# Patient Record
Sex: Female | Born: 1975 | Race: Black or African American | Hispanic: No | Marital: Single | Smoking: Current every day smoker
Health system: Southern US, Community
[De-identification: ages and names within clinical notes are randomized; demographics above are authoritative.]

## PROBLEM LIST (undated history)

## (undated) DIAGNOSIS — G4730 Sleep apnea, unspecified: Secondary | ICD-10-CM

## (undated) DIAGNOSIS — N390 Urinary tract infection, site not specified: Secondary | ICD-10-CM

## (undated) DIAGNOSIS — Z765 Malingerer [conscious simulation]: Secondary | ICD-10-CM

## (undated) DIAGNOSIS — I10 Essential (primary) hypertension: Secondary | ICD-10-CM

## (undated) DIAGNOSIS — M549 Dorsalgia, unspecified: Secondary | ICD-10-CM

## (undated) DIAGNOSIS — I509 Heart failure, unspecified: Secondary | ICD-10-CM

## (undated) DIAGNOSIS — F1910 Other psychoactive substance abuse, uncomplicated: Secondary | ICD-10-CM

## (undated) DIAGNOSIS — G43909 Migraine, unspecified, not intractable, without status migrainosus: Secondary | ICD-10-CM

## (undated) DIAGNOSIS — M1990 Unspecified osteoarthritis, unspecified site: Secondary | ICD-10-CM

## (undated) DIAGNOSIS — G8929 Other chronic pain: Secondary | ICD-10-CM

## (undated) HISTORY — PX: CHOLECYSTECTOMY: SHX55

## (undated) HISTORY — PX: CARDIAC CATHETERIZATION: SHX172

---

## 2002-02-01 ENCOUNTER — Emergency Department (HOSPITAL_COMMUNITY): Admission: EM | Admit: 2002-02-01 | Discharge: 2002-02-01 | Payer: Self-pay | Admitting: *Deleted

## 2002-03-21 ENCOUNTER — Emergency Department (HOSPITAL_COMMUNITY): Admission: EM | Admit: 2002-03-21 | Discharge: 2002-03-21 | Payer: Self-pay | Admitting: Emergency Medicine

## 2002-04-18 ENCOUNTER — Emergency Department (HOSPITAL_COMMUNITY): Admission: EM | Admit: 2002-04-18 | Discharge: 2002-04-18 | Payer: Self-pay | Admitting: *Deleted

## 2002-04-18 ENCOUNTER — Encounter: Payer: Self-pay | Admitting: *Deleted

## 2002-05-25 ENCOUNTER — Emergency Department (HOSPITAL_COMMUNITY): Admission: EM | Admit: 2002-05-25 | Discharge: 2002-05-25 | Payer: Self-pay | Admitting: Emergency Medicine

## 2002-07-05 ENCOUNTER — Emergency Department (HOSPITAL_COMMUNITY): Admission: EM | Admit: 2002-07-05 | Discharge: 2002-07-05 | Payer: Self-pay

## 2002-08-16 ENCOUNTER — Emergency Department (HOSPITAL_COMMUNITY): Admission: EM | Admit: 2002-08-16 | Discharge: 2002-08-17 | Payer: Self-pay | Admitting: Emergency Medicine

## 2002-08-16 ENCOUNTER — Encounter: Payer: Self-pay | Admitting: Emergency Medicine

## 2006-06-26 ENCOUNTER — Emergency Department (HOSPITAL_COMMUNITY): Admission: EM | Admit: 2006-06-26 | Discharge: 2006-06-26 | Payer: Self-pay | Admitting: Emergency Medicine

## 2006-07-15 ENCOUNTER — Emergency Department (HOSPITAL_COMMUNITY): Admission: EM | Admit: 2006-07-15 | Discharge: 2006-07-15 | Payer: Self-pay | Admitting: Emergency Medicine

## 2006-07-31 ENCOUNTER — Emergency Department (HOSPITAL_COMMUNITY): Admission: EM | Admit: 2006-07-31 | Discharge: 2006-07-31 | Payer: Self-pay | Admitting: Emergency Medicine

## 2006-09-10 ENCOUNTER — Emergency Department (HOSPITAL_COMMUNITY): Admission: EM | Admit: 2006-09-10 | Discharge: 2006-09-10 | Payer: Self-pay | Admitting: Emergency Medicine

## 2007-04-04 ENCOUNTER — Emergency Department (HOSPITAL_COMMUNITY): Admission: EM | Admit: 2007-04-04 | Discharge: 2007-04-04 | Payer: Self-pay | Admitting: Emergency Medicine

## 2007-06-07 ENCOUNTER — Emergency Department (HOSPITAL_COMMUNITY): Admission: EM | Admit: 2007-06-07 | Discharge: 2007-06-07 | Payer: Self-pay | Admitting: Emergency Medicine

## 2010-04-18 ENCOUNTER — Emergency Department (HOSPITAL_COMMUNITY)
Admission: EM | Admit: 2010-04-18 | Discharge: 2010-04-18 | Payer: Self-pay | Source: Home / Self Care | Admitting: Emergency Medicine

## 2010-04-24 ENCOUNTER — Emergency Department (HOSPITAL_COMMUNITY)
Admission: EM | Admit: 2010-04-24 | Discharge: 2010-04-24 | Payer: Self-pay | Source: Home / Self Care | Admitting: Emergency Medicine

## 2010-04-28 ENCOUNTER — Emergency Department (HOSPITAL_COMMUNITY)
Admission: EM | Admit: 2010-04-28 | Discharge: 2010-04-28 | Payer: Self-pay | Source: Home / Self Care | Admitting: Emergency Medicine

## 2010-05-04 ENCOUNTER — Emergency Department (HOSPITAL_COMMUNITY)
Admission: EM | Admit: 2010-05-04 | Discharge: 2010-05-04 | Payer: Self-pay | Source: Home / Self Care | Admitting: Emergency Medicine

## 2010-05-05 ENCOUNTER — Emergency Department (HOSPITAL_COMMUNITY)
Admission: EM | Admit: 2010-05-05 | Discharge: 2010-05-05 | Payer: Self-pay | Source: Home / Self Care | Admitting: Emergency Medicine

## 2010-05-08 ENCOUNTER — Emergency Department (HOSPITAL_COMMUNITY)
Admission: EM | Admit: 2010-05-08 | Discharge: 2010-05-08 | Payer: Self-pay | Source: Home / Self Care | Admitting: Emergency Medicine

## 2010-05-17 ENCOUNTER — Emergency Department (HOSPITAL_COMMUNITY)
Admission: EM | Admit: 2010-05-17 | Discharge: 2010-05-17 | Payer: Self-pay | Source: Home / Self Care | Admitting: Emergency Medicine

## 2010-05-18 ENCOUNTER — Emergency Department (HOSPITAL_COMMUNITY)
Admission: EM | Admit: 2010-05-18 | Discharge: 2010-05-19 | Payer: Self-pay | Source: Home / Self Care | Admitting: Emergency Medicine

## 2010-05-18 LAB — GLUCOSE, CAPILLARY: Glucose-Capillary: 331 mg/dL — ABNORMAL HIGH (ref 70–99)

## 2010-05-22 ENCOUNTER — Emergency Department (HOSPITAL_COMMUNITY)
Admission: EM | Admit: 2010-05-22 | Discharge: 2010-05-22 | Payer: Self-pay | Source: Home / Self Care | Admitting: Emergency Medicine

## 2010-05-28 ENCOUNTER — Inpatient Hospital Stay (HOSPITAL_COMMUNITY)
Admission: EM | Admit: 2010-05-28 | Discharge: 2010-05-31 | Payer: Self-pay | Source: Home / Self Care | Attending: Internal Medicine | Admitting: Internal Medicine

## 2010-05-29 ENCOUNTER — Encounter (INDEPENDENT_AMBULATORY_CARE_PROVIDER_SITE_OTHER): Payer: Self-pay | Admitting: Internal Medicine

## 2010-05-30 LAB — BASIC METABOLIC PANEL
BUN: 8 mg/dL (ref 6–23)
BUN: 12 mg/dL (ref 6–23)
CO2: 27 mEq/L (ref 19–32)
CO2: 27 mEq/L (ref 19–32)
Calcium: 9.3 mg/dL (ref 8.4–10.5)
Calcium: 9.3 mg/dL (ref 8.4–10.5)
Chloride: 97 mEq/L (ref 96–112)
Chloride: 95 mEq/L — ABNORMAL LOW (ref 96–112)
Creatinine, Ser: 1.19 mg/dL (ref 0.4–1.2)
Creatinine, Ser: 1.5 mg/dL — ABNORMAL HIGH (ref 0.4–1.2)
GFR calc Af Amer: >60 mL/min (ref >60)
GFR calc Af Amer: 48 mL/min — ABNORMAL LOW (ref >60)
GFR calc non Af Amer: 52 mL/min — ABNORMAL LOW (ref >60)
GFR calc non Af Amer: 40 mL/min — ABNORMAL LOW (ref >60)
Glucose, Bld: 391 mg/dL — ABNORMAL HIGH (ref 70–99)
Glucose, Bld: 366 mg/dL — ABNORMAL HIGH (ref 70–99)
Potassium: 3.5 mEq/L (ref 3.5–5.1)
Potassium: 4 mEq/L (ref 3.5–5.1)
Sodium: 134 mEq/L — ABNORMAL LOW (ref 135–145)
Sodium: 134 mEq/L — ABNORMAL LOW (ref 135–145)

## 2010-05-30 LAB — GLUCOSE, RANDOM: Glucose, Bld: 454 mg/dL — ABNORMAL HIGH (ref 70–99)

## 2010-05-30 LAB — URINALYSIS, ROUTINE W REFLEX MICROSCOPIC
Bilirubin Urine: NEGATIVE
Hgb urine dipstick: NEGATIVE
Ketones, ur: NEGATIVE mg/dL
Leukocytes, UA: NEGATIVE
Nitrite: NEGATIVE
Protein, ur: NEGATIVE mg/dL
Specific Gravity, Urine: 1.013 (ref 1.005–1.030)
Urine Glucose, Fasting: >1000 mg/dL — AB
Urobilinogen, UA: 0.2 mg/dL (ref 0.0–1.0)
pH: 6 (ref 5.0–8.0)

## 2010-05-30 LAB — LIPID PANEL
Cholesterol: 164 mg/dL (ref 0–200)
HDL: 56 mg/dL (ref >39)
LDL Cholesterol: 42 mg/dL (ref 0–99)
Total CHOL/HDL Ratio: 2.9 RATIO
Triglycerides: 332 mg/dL — ABNORMAL HIGH (ref <150)
VLDL: 66 mg/dL — ABNORMAL HIGH (ref 0–40)

## 2010-05-30 LAB — CARDIAC PANEL(CRET KIN+CKTOT+MB+TROPI)
CK, MB: 1 ng/mL (ref 0.3–4.0)
CK, MB: 1.2 ng/mL (ref 0.3–4.0)
CK, MB: 1.4 ng/mL (ref 0.3–4.0)
Relative Index: INVALID (ref 0.0–2.5)
Relative Index: INVALID (ref 0.0–2.5)
Relative Index: INVALID (ref 0.0–2.5)
Total CK: 73 U/L (ref 7–177)
Total CK: 74 U/L (ref 7–177)
Total CK: 84 U/L (ref 7–177)
Troponin I: 0.04 ng/mL (ref 0.00–0.06)
Troponin I: 0.04 ng/mL (ref 0.00–0.06)
Troponin I: 0.03 ng/mL (ref 0.00–0.06)

## 2010-05-30 LAB — GLUCOSE, CAPILLARY
Glucose-Capillary: 304 mg/dL — ABNORMAL HIGH (ref 70–99)
Glucose-Capillary: 319 mg/dL — ABNORMAL HIGH (ref 70–99)
Glucose-Capillary: 396 mg/dL — ABNORMAL HIGH (ref 70–99)
Glucose-Capillary: 280 mg/dL — ABNORMAL HIGH (ref 70–99)
Glucose-Capillary: 346 mg/dL — ABNORMAL HIGH (ref 70–99)
Glucose-Capillary: 408 mg/dL — ABNORMAL HIGH (ref 70–99)
Glucose-Capillary: 419 mg/dL — ABNORMAL HIGH (ref 70–99)
Glucose-Capillary: 424 mg/dL — ABNORMAL HIGH (ref 70–99)
Glucose-Capillary: 431 mg/dL — ABNORMAL HIGH (ref 70–99)
Glucose-Capillary: 490 mg/dL — ABNORMAL HIGH (ref 70–99)

## 2010-05-30 LAB — CBC
HCT: 39.1 % (ref 36.0–46.0)
HCT: 37 % (ref 36.0–46.0)
Hemoglobin: 12.9 g/dL (ref 12.0–15.0)
Hemoglobin: 12 g/dL (ref 12.0–15.0)
MCH: 30.9 pg (ref 26.0–34.0)
MCH: 30.5 pg (ref 26.0–34.0)
MCHC: 33 g/dL (ref 30.0–36.0)
MCHC: 32.4 g/dL (ref 30.0–36.0)
MCV: 93.5 fL (ref 78.0–100.0)
MCV: 93.9 fL (ref 78.0–100.0)
Platelets: 255 10*3/uL (ref 150–400)
Platelets: 251 10*3/uL (ref 150–400)
RBC: 4.18 MIL/uL (ref 3.87–5.11)
RBC: 3.94 MIL/uL (ref 3.87–5.11)
RDW: 14.9 % (ref 11.5–15.5)
RDW: 15.2 % (ref 11.5–15.5)
WBC: 6.3 10*3/uL (ref 4.0–10.5)
WBC: 9.2 10*3/uL (ref 4.0–10.5)

## 2010-05-30 LAB — DIFFERENTIAL
Basophils Absolute: 0 10*3/uL (ref 0.0–0.1)
Basophils Relative: 0 % (ref 0–1)
Eosinophils Absolute: 0.1 10*3/uL (ref 0.0–0.7)
Eosinophils Relative: 2 % (ref 0–5)
Lymphocytes Relative: 26 % (ref 12–46)
Lymphs Abs: 1.6 10*3/uL (ref 0.7–4.0)
Monocytes Absolute: 0.4 10*3/uL (ref 0.1–1.0)
Monocytes Relative: 7 % (ref 3–12)
Neutro Abs: 4.1 10*3/uL (ref 1.7–7.7)
Neutrophils Relative %: 66 % (ref 43–77)

## 2010-05-30 LAB — URINE MICROSCOPIC-ADD ON

## 2010-05-30 LAB — BLOOD GAS, ARTERIAL
Acid-Base Excess: 1.8 mmol/L (ref 0.0–2.0)
Bicarbonate: 25.9 mEq/L — ABNORMAL HIGH (ref 20.0–24.0)
Drawn by: 30996
FIO2: 0.21 %
O2 Saturation: 95.4 %
Patient temperature: 98.6
TCO2: 23.4 mmol/L (ref 0–100)
pCO2 arterial: 41 mmHg (ref 35.0–45.0)
pH, Arterial: 7.418 — ABNORMAL HIGH (ref 7.350–7.400)
pO2, Arterial: 73.7 mmHg — ABNORMAL LOW (ref 80.0–100.0)

## 2010-05-30 LAB — POCT PREGNANCY, URINE: Preg Test, Ur: NEGATIVE

## 2010-05-30 LAB — MAGNESIUM
Magnesium: 1.8 mg/dL (ref 1.5–2.5)
Magnesium: 1.8 mg/dL (ref 1.5–2.5)

## 2010-05-30 LAB — HEMOGLOBIN A1C
Hgb A1c MFr Bld: 11.7 % — ABNORMAL HIGH (ref <5.7)
Mean Plasma Glucose: 289 mg/dL — ABNORMAL HIGH (ref <117)

## 2010-05-30 LAB — POCT CARDIAC MARKERS
CKMB, poc: <1 ng/mL — ABNORMAL LOW (ref 1.0–8.0)
Myoglobin, poc: 139 ng/mL (ref 12–200)
Troponin i, poc: <0.05 ng/mL (ref 0.00–0.09)

## 2010-05-30 LAB — BRAIN NATRIURETIC PEPTIDE: Pro B Natriuretic peptide (BNP): 216 pg/mL — ABNORMAL HIGH (ref 0.0–100.0)

## 2010-06-01 LAB — GLUCOSE, CAPILLARY
Glucose-Capillary: 360 mg/dL — ABNORMAL HIGH (ref 70–99)
Glucose-Capillary: 254 mg/dL — ABNORMAL HIGH (ref 70–99)
Glucose-Capillary: 276 mg/dL — ABNORMAL HIGH (ref 70–99)
Glucose-Capillary: 457 mg/dL — ABNORMAL HIGH (ref 70–99)
Glucose-Capillary: 311 mg/dL — ABNORMAL HIGH (ref 70–99)
Glucose-Capillary: 219 mg/dL — ABNORMAL HIGH (ref 70–99)
Glucose-Capillary: 176 mg/dL — ABNORMAL HIGH (ref 70–99)
Glucose-Capillary: 321 mg/dL — ABNORMAL HIGH (ref 70–99)

## 2010-06-01 LAB — BASIC METABOLIC PANEL
BUN: 17 mg/dL (ref 6–23)
CO2: 28 mEq/L (ref 19–32)
Calcium: 9.4 mg/dL (ref 8.4–10.5)
Chloride: 96 mEq/L (ref 96–112)
Creatinine, Ser: 1.59 mg/dL — ABNORMAL HIGH (ref 0.4–1.2)
GFR calc Af Amer: 45 mL/min — ABNORMAL LOW (ref >60)
GFR calc non Af Amer: 37 mL/min — ABNORMAL LOW (ref >60)
Glucose, Bld: 308 mg/dL — ABNORMAL HIGH (ref 70–99)
Potassium: 3.6 mEq/L (ref 3.5–5.1)
Sodium: 135 mEq/L (ref 135–145)

## 2010-06-07 NOTE — Discharge Summary (Signed)
NAME:  Debra Stokes, Debra Stokes NO.:  [id number]  MEDICAL RECORD NO.:  [id number]          PATIENT TYPE:  INP  LOCATION:  1423                         FACILITY:  [facility]  PHYSICIAN:  Brendia Sacks, MD    DATE OF BIRTH:  [birth year]  DATE OF ADMISSION:  05/28/2010 DATE OF DISCHARGE:  05/31/2010                              DISCHARGE SUMMARY   PRIMARY CARE PHYSICIAN:  None, but she will be establishing with a physician of her choice in the [place] area.  CONDITION ON DISCHARGE:  Improved.  DISCHARGE DIAGNOSES: 1. Acute diastolic congestive heart failure secondary to hypertensive     heart disease. 2. Uncontrolled hypertension, now improved. 3. Uncontrolled diabetes mellitus type 2, improved. 4. Subacute bronchitis. 5. Chronic kidney disease stage 3.  HISTORY OF PRESENT ILLNESS:  This is a 35 year old woman who presented to the emergency room with shortness of breath.  HOSPITAL COURSE: 1. Acute diastolic congestive heart failure.  The patient was admitted     to the medical floor, underwent 2-D echocardiogram which revealed     diastolic dysfunction.  She was diuresed with Lasix and feels much     better at this point.  She was started on low-dose beta blocker and     will continue on Lasix at this time.  She probably also has a     component of obesity hypoventilation syndrome, and she does have a     known history of asthma although this appears to be stable at this     point.  She will establish with a primary care physician in the     area and would suggest considering followup with Cardiology in the     next several months. 2. Diabetes mellitus type 2, uncontrolled, now improved, controlled     during this hospitalization.  This has been uncontrolled for some     time as demonstrated by her hemoglobin A1c.  She will continue on     her glipizide.  Her Lantus has been increased.  She will complete     her short prednisone taper over the next couple of days  and expect     her blood sugars will continue to improve on this higher regimen of     Lantus. 3. Hypertension, fair control at this point and stable for discharge.     Continue on Lasix, beta-blocker therapy and low-dose lisinopril     given her diabetes.  I have decreased her clonidine to 0.2 mg and     would suggest weaning this off if possible. 4. Subacute bronchitis.  She will complete Augmentin here in the     hospital and complete a rapid prednisone taper.  She will continue     with albuterol nebulizers for her asthma as needed. 5. Chronic kidney disease stage 3, resolved, paucity of data but her     baseline creatinine does appear to be approximately 1.3.  Her     creatinine here during this hospitalization has been stable.     Suggest followup in the outpatient setting as clinically indicated.     This is probably a combination  of hypertensive heart disease as     well as diabetes-induced kidney damage.  CONSULTATIONS:  None.  PROCEDURES:  None.  IMAGING: 1. Chest x-ray on January 14:  No acute cardiopulmonary disease. 2. CT angiogram of the chest, January 14:  Limited exam secondary to     body habitus.  No evidence of pulmonary embolism.  Cardiomegaly     without edema.  Findings suggestive of small airways disease/air     trapping.  Hepatic steatosis is seen.  MICROBIOLOGY:  None.  ANCILLARY STUDIES:  2-D echocardiogram showed a left ventricular ejection fraction characterized as mildly reduced.  Doppler parameters were consistent with the restrictive physiology.  Findings suggest decompensated diastolic congestive heart failure.  Systolic function probably mildly reduced.  Consider repeating echo in the future with Definity contrast.  PERTINENT LABORATORY STUDIES: 1. BNP was mildly elevated at 216. 2. Urine pregnancy was negative.  Triglycerides were 332, hemoglobin     A1c was 11.7.  Basic metabolic panel notable for creatinine of 1.5-     1.59 which appears to  be stable and near the patient's baseline of     approximately 1.3. 3. CBC was unremarkable.  PHYSICAL EXAMINATION ON DISCHARGE:  GENERAL:  The patient is feeling well.  No complaints.  She would like to go home. VITAL SIGNS:  Temperature is 98.0, pulse 75, respirations 18, blood pressure 151/95, sat 95% on room air.  CARDIOVASCULAR:  Regular rate and rhythm.  No murmur, rub or gallop. RESPIRATORY:  Clear to auscultation bilaterally.  No wheezes, rales or rhonchi.  Normal respiratory effort.  No lower extremity edema.  DISCHARGE INSTRUCTIONS:  The patient will be discharged home today. Heart-healthy diabetic diet.  Activity as tolerated.  She has Medicare and Medicaid insurance and has been provided with resources for finding a primary care physician of her choice in this area.  She should follow up in 2-3 weeks' time.  DISCHARGE MEDICATIONS: 1. Albuterol inhaler 2 puffs every 4 hours as needed for shortness of     breath. 2. Clonidine 0.3 mg p.o. b.i.d., note this dosage remains unchanged. 3. Lasix 20 mg p.o. daily. 4. Lantus has been increased to 35 units subcu b.i.d. 5. Metoprolol 25 mg p.o. b.i.d. 6. Prednisone 10 mg tablets take 20 mg tomorrow on January 18, then 10     mg p.o. daily x2 days and then stop. 7. Albuterol nebulizer every 4 hours as needed for shortness of     breath. 8. Colchicine 0.6 mg p.o. every morning. 9. Glipizide 10 mg p.o. daily.  Discontinue the following medications: 1. Hydrochlorothiazide. 2. Augmentin. 3. Excedrin Extra Strength.  THINGS TO FOLLOW UP IN THE OUTPATIENT SETTING: 1. Acute diastolic heart failure.  Continue to titrate medications as     needed.  Follow periodic basic metabolic panel.  Consider     outpatient referral to Cardiology in the next several months. 2. Uncontrolled diabetes mellitus type 2.  See above.  Hopefully can     start an ACE inhibitor on her in the near future given her chronic     kidney disease with mild  elevation above her baseline here and the     fact that she has been started on Lasix in place of     hydrochlorothiazide, at this point would hold off on ACE inhibitor     therapy.  However, again would suggest instituting this and     following her basic metabolic panel closely as an outpatient. 3. Hypertension.  As described above, continue to titrate medications     for goal blood pressure. 4. Hypertriglyceridemia.  Trial of diet.  Consider medication therapy     if needed. 5. Asthma.  Note, she had an abnormal CT scan which showed some air     trapping.  Consider referral in the outpatient setting for periodic     monitoring of her asthma. 6. Chronic kidney disease stage 3.  Continue to follow this as     described above.  TIME COORDINATING DISCHARGE:  35 minutes.     Brendia Sacks, MD     DG/MEDQ  D:  05/31/2010  T:  05/31/2010  Job:  [id number]  Electronically Signed by Brendia Sacks  on 06/07/2010 02:19:58 PM

## 2010-06-08 ENCOUNTER — Emergency Department (HOSPITAL_COMMUNITY)
Admission: EM | Admit: 2010-06-08 | Discharge: 2010-06-08 | Payer: Self-pay | Source: Home / Self Care | Admitting: Emergency Medicine

## 2010-07-03 ENCOUNTER — Emergency Department (HOSPITAL_COMMUNITY)
Admission: EM | Admit: 2010-07-03 | Discharge: 2010-07-03 | Disposition: A | Discharge status: Home / Self Care | Payer: Medicare Other | Attending: Emergency Medicine | Admitting: Emergency Medicine

## 2010-07-03 ENCOUNTER — Emergency Department (HOSPITAL_COMMUNITY): Payer: Medicare Other

## 2010-07-03 DIAGNOSIS — I10 Essential (primary) hypertension: Secondary | ICD-10-CM | POA: Insufficient documentation

## 2010-07-03 DIAGNOSIS — M546 Pain in thoracic spine: Secondary | ICD-10-CM | POA: Insufficient documentation

## 2010-07-03 DIAGNOSIS — J45909 Unspecified asthma, uncomplicated: Secondary | ICD-10-CM | POA: Insufficient documentation

## 2010-07-03 DIAGNOSIS — Z794 Long term (current) use of insulin: Secondary | ICD-10-CM | POA: Insufficient documentation

## 2010-07-03 DIAGNOSIS — R079 Chest pain, unspecified: Secondary | ICD-10-CM | POA: Insufficient documentation

## 2010-07-03 DIAGNOSIS — E119 Type 2 diabetes mellitus without complications: Secondary | ICD-10-CM | POA: Insufficient documentation

## 2010-07-03 DIAGNOSIS — Z79899 Other long term (current) drug therapy: Secondary | ICD-10-CM | POA: Insufficient documentation

## 2010-07-03 DIAGNOSIS — I5030 Unspecified diastolic (congestive) heart failure: Secondary | ICD-10-CM | POA: Insufficient documentation

## 2010-07-03 DIAGNOSIS — M1990 Unspecified osteoarthritis, unspecified site: Secondary | ICD-10-CM | POA: Insufficient documentation

## 2010-07-06 ENCOUNTER — Emergency Department (HOSPITAL_COMMUNITY): Payer: Medicare Other

## 2010-07-06 ENCOUNTER — Inpatient Hospital Stay (INDEPENDENT_AMBULATORY_CARE_PROVIDER_SITE_OTHER)
Admission: RE | Admit: 2010-07-06 | Discharge: 2010-07-06 | Disposition: A | Discharge status: Home / Self Care | Payer: Medicare Other | Source: Ambulatory Visit | Attending: Emergency Medicine | Admitting: Emergency Medicine

## 2010-07-06 ENCOUNTER — Emergency Department (HOSPITAL_COMMUNITY)
Admission: EM | Admit: 2010-07-06 | Discharge: 2010-07-06 | Disposition: A | Discharge status: Home / Self Care | Payer: Medicare Other | Attending: Emergency Medicine | Admitting: Emergency Medicine

## 2010-07-06 DIAGNOSIS — M549 Dorsalgia, unspecified: Secondary | ICD-10-CM | POA: Insufficient documentation

## 2010-07-06 DIAGNOSIS — Z794 Long term (current) use of insulin: Secondary | ICD-10-CM | POA: Insufficient documentation

## 2010-07-06 DIAGNOSIS — M542 Cervicalgia: Secondary | ICD-10-CM

## 2010-07-06 DIAGNOSIS — E119 Type 2 diabetes mellitus without complications: Secondary | ICD-10-CM | POA: Insufficient documentation

## 2010-07-06 DIAGNOSIS — I10 Essential (primary) hypertension: Secondary | ICD-10-CM

## 2010-07-06 DIAGNOSIS — J45909 Unspecified asthma, uncomplicated: Secondary | ICD-10-CM | POA: Insufficient documentation

## 2010-07-06 LAB — URINALYSIS, ROUTINE W REFLEX MICROSCOPIC
Leukocytes, UA: NEGATIVE
Protein, ur: 30 mg/dL — AB
Specific Gravity, Urine: 1.018 (ref 1.005–1.030)
Urine Glucose, Fasting: 250 mg/dL — AB

## 2010-07-06 LAB — URINE MICROSCOPIC-ADD ON

## 2010-07-17 ENCOUNTER — Emergency Department (HOSPITAL_COMMUNITY)
Admission: EM | Admit: 2010-07-17 | Discharge: 2010-07-17 | Disposition: A | Discharge status: Home / Self Care | Payer: Medicare Other | Attending: Emergency Medicine | Admitting: Emergency Medicine

## 2010-07-17 DIAGNOSIS — L02219 Cutaneous abscess of trunk, unspecified: Secondary | ICD-10-CM | POA: Insufficient documentation

## 2010-07-17 DIAGNOSIS — E119 Type 2 diabetes mellitus without complications: Secondary | ICD-10-CM | POA: Insufficient documentation

## 2010-07-17 DIAGNOSIS — Z794 Long term (current) use of insulin: Secondary | ICD-10-CM | POA: Insufficient documentation

## 2010-07-17 DIAGNOSIS — I10 Essential (primary) hypertension: Secondary | ICD-10-CM | POA: Insufficient documentation

## 2010-07-19 ENCOUNTER — Emergency Department (HOSPITAL_COMMUNITY)
Admission: EM | Admit: 2010-07-19 | Discharge: 2010-07-19 | Disposition: A | Discharge status: Home / Self Care | Payer: Medicare Other | Attending: Emergency Medicine | Admitting: Emergency Medicine

## 2010-07-19 DIAGNOSIS — M109 Gout, unspecified: Secondary | ICD-10-CM | POA: Insufficient documentation

## 2010-07-19 DIAGNOSIS — I509 Heart failure, unspecified: Secondary | ICD-10-CM | POA: Insufficient documentation

## 2010-07-19 DIAGNOSIS — Z794 Long term (current) use of insulin: Secondary | ICD-10-CM | POA: Insufficient documentation

## 2010-07-19 DIAGNOSIS — I10 Essential (primary) hypertension: Secondary | ICD-10-CM | POA: Insufficient documentation

## 2010-07-19 DIAGNOSIS — E119 Type 2 diabetes mellitus without complications: Secondary | ICD-10-CM | POA: Insufficient documentation

## 2010-07-19 DIAGNOSIS — L02219 Cutaneous abscess of trunk, unspecified: Secondary | ICD-10-CM | POA: Insufficient documentation

## 2010-07-19 DIAGNOSIS — J45909 Unspecified asthma, uncomplicated: Secondary | ICD-10-CM | POA: Insufficient documentation

## 2010-07-21 ENCOUNTER — Emergency Department (HOSPITAL_COMMUNITY): Payer: Medicare Other

## 2010-07-21 ENCOUNTER — Emergency Department (HOSPITAL_COMMUNITY)
Admission: EM | Admit: 2010-07-21 | Discharge: 2010-07-21 | Disposition: A | Discharge status: Home / Self Care | Payer: Medicare Other | Attending: Emergency Medicine | Admitting: Emergency Medicine

## 2010-07-21 DIAGNOSIS — L02219 Cutaneous abscess of trunk, unspecified: Secondary | ICD-10-CM | POA: Insufficient documentation

## 2010-07-21 DIAGNOSIS — R0989 Other specified symptoms and signs involving the circulatory and respiratory systems: Secondary | ICD-10-CM | POA: Insufficient documentation

## 2010-07-21 DIAGNOSIS — R059 Cough, unspecified: Secondary | ICD-10-CM | POA: Insufficient documentation

## 2010-07-21 DIAGNOSIS — R05 Cough: Secondary | ICD-10-CM | POA: Insufficient documentation

## 2010-07-21 DIAGNOSIS — I10 Essential (primary) hypertension: Secondary | ICD-10-CM | POA: Insufficient documentation

## 2010-07-21 DIAGNOSIS — R0789 Other chest pain: Secondary | ICD-10-CM | POA: Insufficient documentation

## 2010-07-21 DIAGNOSIS — R0609 Other forms of dyspnea: Secondary | ICD-10-CM | POA: Insufficient documentation

## 2010-07-21 DIAGNOSIS — E119 Type 2 diabetes mellitus without complications: Secondary | ICD-10-CM | POA: Insufficient documentation

## 2010-07-21 DIAGNOSIS — I428 Other cardiomyopathies: Secondary | ICD-10-CM | POA: Insufficient documentation

## 2010-07-21 DIAGNOSIS — J45909 Unspecified asthma, uncomplicated: Secondary | ICD-10-CM | POA: Insufficient documentation

## 2010-07-21 DIAGNOSIS — I509 Heart failure, unspecified: Secondary | ICD-10-CM | POA: Insufficient documentation

## 2010-07-21 LAB — CBC
HCT: 41.4 % (ref 36.0–46.0)
Hemoglobin: 13.4 g/dL (ref 12.0–15.0)
MCV: 91.4 fL (ref 78.0–100.0)
WBC: 6.3 10*3/uL (ref 4.0–10.5)

## 2010-07-21 LAB — GLUCOSE, CAPILLARY: Glucose-Capillary: 314 mg/dL — ABNORMAL HIGH (ref 70–99)

## 2010-07-21 LAB — COMPREHENSIVE METABOLIC PANEL
ALT: 22 U/L (ref 0–35)
Alkaline Phosphatase: 55 U/L (ref 39–117)
BUN: 13 mg/dL (ref 6–23)
CO2: 28 mEq/L (ref 19–32)
GFR calc non Af Amer: 53 mL/min — ABNORMAL LOW (ref >60)
Glucose, Bld: 326 mg/dL — ABNORMAL HIGH (ref 70–99)
Potassium: 3.5 mEq/L (ref 3.5–5.1)
Sodium: 135 mEq/L (ref 135–145)
Total Bilirubin: 0.7 mg/dL (ref 0.3–1.2)

## 2010-07-21 LAB — DIFFERENTIAL
Basophils Absolute: 0 10*3/uL (ref 0.0–0.1)
Eosinophils Relative: 1 % (ref 0–5)
Lymphocytes Relative: 22 % (ref 12–46)
Lymphs Abs: 1.4 10*3/uL (ref 0.7–4.0)
Monocytes Absolute: 0.5 10*3/uL (ref 0.1–1.0)
Neutro Abs: 4.3 10*3/uL (ref 1.7–7.7)

## 2010-07-21 LAB — POCT CARDIAC MARKERS
CKMB, poc: <1 ng/mL — ABNORMAL LOW (ref 1.0–8.0)
Troponin i, poc: <0.05 ng/mL (ref 0.00–0.09)

## 2010-07-21 LAB — BRAIN NATRIURETIC PEPTIDE: Pro B Natriuretic peptide (BNP): 84.9 pg/mL (ref 0.0–100.0)

## 2010-07-25 LAB — COMPREHENSIVE METABOLIC PANEL
ALT: 18 U/L (ref 0–35)
AST: 22 U/L (ref 0–37)
Albumin: 3.3 g/dL — ABNORMAL LOW (ref 3.5–5.2)
Alkaline Phosphatase: 57 U/L (ref 39–117)
BUN: 9 mg/dL (ref 6–23)
CO2: 28 mEq/L (ref 19–32)
Calcium: 9 mg/dL (ref 8.4–10.5)
Chloride: 99 mEq/L (ref 96–112)
Creatinine, Ser: 1.23 mg/dL — ABNORMAL HIGH (ref 0.4–1.2)
GFR calc Af Amer: >60 mL/min (ref >60)
GFR calc non Af Amer: 50 mL/min — ABNORMAL LOW (ref >60)
Glucose, Bld: 313 mg/dL — ABNORMAL HIGH (ref 70–99)
Potassium: 4 mEq/L (ref 3.5–5.1)
Sodium: 137 mEq/L (ref 135–145)
Total Bilirubin: 0.8 mg/dL (ref 0.3–1.2)
Total Protein: 7.4 g/dL (ref 6.0–8.3)

## 2010-07-25 LAB — POCT I-STAT, CHEM 8
BUN: 5 mg/dL — ABNORMAL LOW (ref 6–23)
Calcium, Ion: 1.08 mmol/L — ABNORMAL LOW (ref 1.12–1.32)
Calcium, Ion: 1.11 mmol/L — ABNORMAL LOW (ref 1.12–1.32)
Creatinine, Ser: 1.2 mg/dL (ref 0.4–1.2)
Glucose, Bld: 339 mg/dL — ABNORMAL HIGH (ref 70–99)
HCT: 45 % (ref 36.0–46.0)
Hemoglobin: 15.3 g/dL — ABNORMAL HIGH (ref 12.0–15.0)
Hemoglobin: 16 g/dL — ABNORMAL HIGH (ref 12.0–15.0)
Sodium: 137 mEq/L (ref 135–145)
Sodium: 137 mEq/L (ref 135–145)
TCO2: 30 mmol/L (ref 0–100)
TCO2: 30 mmol/L (ref 0–100)

## 2010-07-25 LAB — HEPATIC FUNCTION PANEL
Alkaline Phosphatase: 48 U/L (ref 39–117)
Indirect Bilirubin: 0.5 mg/dL (ref 0.3–0.9)
Total Bilirubin: 0.6 mg/dL (ref 0.3–1.2)
Total Protein: 7.5 g/dL (ref 6.0–8.3)

## 2010-07-25 LAB — BASIC METABOLIC PANEL
CO2: 27 mEq/L (ref 19–32)
Chloride: 98 mEq/L (ref 96–112)
Creatinine, Ser: 1.3 mg/dL — ABNORMAL HIGH (ref 0.4–1.2)
GFR calc Af Amer: 57 mL/min — ABNORMAL LOW (ref >60)
Potassium: 3.6 mEq/L (ref 3.5–5.1)

## 2010-07-25 LAB — POCT CARDIAC MARKERS
CKMB, poc: <1 ng/mL — ABNORMAL LOW (ref 1.0–8.0)
CKMB, poc: <1 ng/mL — ABNORMAL LOW (ref 1.0–8.0)
CKMB, poc: <1 ng/mL — ABNORMAL LOW (ref 1.0–8.0)
CKMB, poc: <1 ng/mL — ABNORMAL LOW (ref 1.0–8.0)
Myoglobin, poc: 40.8 ng/mL (ref 12–200)
Myoglobin, poc: 63.9 ng/mL (ref 12–200)
Myoglobin, poc: 79.9 ng/mL (ref 12–200)
Myoglobin, poc: 64.5 ng/mL (ref 12–200)
Myoglobin, poc: 65.6 ng/mL (ref 12–200)
Troponin i, poc: <0.05 ng/mL (ref 0.00–0.09)
Troponin i, poc: <0.05 ng/mL (ref 0.00–0.09)

## 2010-07-25 LAB — DIFFERENTIAL
Basophils Absolute: 0 10*3/uL (ref 0.0–0.1)
Basophils Relative: 0 % (ref 0–1)
Basophils Relative: 0 % (ref 0–1)
Eosinophils Absolute: 0.1 10*3/uL (ref 0.0–0.7)
Eosinophils Absolute: 0.1 10*3/uL (ref 0.0–0.7)
Eosinophils Relative: 1 % (ref 0–5)
Eosinophils Relative: 1 % (ref 0–5)
Lymphocytes Relative: 27 % (ref 12–46)
Lymphs Abs: 1.3 10*3/uL (ref 0.7–4.0)
Lymphs Abs: 2.2 10*3/uL (ref 0.7–4.0)
Monocytes Absolute: 0.4 10*3/uL (ref 0.1–1.0)
Monocytes Relative: 8 % (ref 3–12)
Monocytes Relative: 9 % (ref 3–12)
Neutro Abs: 3.1 10*3/uL (ref 1.7–7.7)
Neutrophils Relative %: 64 % (ref 43–77)

## 2010-07-25 LAB — CBC
HCT: 38.2 % (ref 36.0–46.0)
HCT: 42.4 % (ref 36.0–46.0)
Hemoglobin: 12.2 g/dL (ref 12.0–15.0)
Hemoglobin: 14.1 g/dL (ref 12.0–15.0)
MCH: 29.8 pg (ref 26.0–34.0)
MCH: 31.1 pg (ref 26.0–34.0)
MCH: 30.9 pg (ref 26.0–34.0)
MCHC: 31.9 g/dL (ref 30.0–36.0)
MCHC: 32.8 g/dL (ref 30.0–36.0)
MCV: 93.2 fL (ref 78.0–100.0)
MCV: 93.4 fL (ref 78.0–100.0)
MCV: 94.2 fL (ref 78.0–100.0)
Platelets: 246 10*3/uL (ref 150–400)
Platelets: 319 10*3/uL (ref 150–400)
Platelets: 259 10*3/uL (ref 150–400)
RBC: 4.1 MIL/uL (ref 3.87–5.11)
RBC: 4.54 MIL/uL (ref 3.87–5.11)
RBC: 4.47 MIL/uL (ref 3.87–5.11)
RDW: 14.6 % (ref 11.5–15.5)
WBC: 4.9 10*3/uL (ref 4.0–10.5)
WBC: 8.6 10*3/uL (ref 4.0–10.5)

## 2010-07-25 LAB — BRAIN NATRIURETIC PEPTIDE: Pro B Natriuretic peptide (BNP): 161 pg/mL — ABNORMAL HIGH (ref 0.0–100.0)

## 2010-07-25 LAB — D-DIMER, QUANTITATIVE: D-Dimer, Quant: 0.44 ug/mL-FEU (ref 0.00–0.48)

## 2010-07-25 LAB — GLUCOSE, CAPILLARY: Glucose-Capillary: 320 mg/dL — ABNORMAL HIGH (ref 70–99)

## 2010-07-25 LAB — LIPASE, BLOOD: Lipase: 35 U/L (ref 11–59)

## 2010-07-25 LAB — URINALYSIS, ROUTINE W REFLEX MICROSCOPIC
Bilirubin Urine: NEGATIVE
Glucose, UA: >1000 mg/dL — AB
Hgb urine dipstick: NEGATIVE
Specific Gravity, Urine: 1.008 (ref 1.005–1.030)
Urobilinogen, UA: 0.2 mg/dL (ref 0.0–1.0)

## 2010-07-25 LAB — URINE MICROSCOPIC-ADD ON

## 2010-07-29 ENCOUNTER — Emergency Department (HOSPITAL_COMMUNITY)
Admission: EM | Admit: 2010-07-29 | Discharge: 2010-07-29 | Disposition: A | Discharge status: Home / Self Care | Payer: Medicare Other | Attending: Emergency Medicine | Admitting: Emergency Medicine

## 2010-07-29 DIAGNOSIS — R079 Chest pain, unspecified: Secondary | ICD-10-CM | POA: Insufficient documentation

## 2010-07-29 DIAGNOSIS — E119 Type 2 diabetes mellitus without complications: Secondary | ICD-10-CM | POA: Insufficient documentation

## 2010-07-29 DIAGNOSIS — F411 Generalized anxiety disorder: Secondary | ICD-10-CM | POA: Insufficient documentation

## 2010-07-29 DIAGNOSIS — I10 Essential (primary) hypertension: Secondary | ICD-10-CM | POA: Insufficient documentation

## 2010-07-29 DIAGNOSIS — M549 Dorsalgia, unspecified: Secondary | ICD-10-CM | POA: Insufficient documentation

## 2010-07-29 DIAGNOSIS — Z794 Long term (current) use of insulin: Secondary | ICD-10-CM | POA: Insufficient documentation

## 2010-08-01 ENCOUNTER — Emergency Department (HOSPITAL_COMMUNITY)
Admission: EM | Admit: 2010-08-01 | Discharge: 2010-08-01 | Disposition: A | Discharge status: Home / Self Care | Payer: Medicare Other | Attending: Emergency Medicine | Admitting: Emergency Medicine

## 2010-08-01 DIAGNOSIS — I10 Essential (primary) hypertension: Secondary | ICD-10-CM | POA: Insufficient documentation

## 2010-08-01 DIAGNOSIS — E119 Type 2 diabetes mellitus without complications: Secondary | ICD-10-CM | POA: Insufficient documentation

## 2010-08-01 DIAGNOSIS — L02219 Cutaneous abscess of trunk, unspecified: Secondary | ICD-10-CM | POA: Insufficient documentation

## 2010-08-01 DIAGNOSIS — Z794 Long term (current) use of insulin: Secondary | ICD-10-CM | POA: Insufficient documentation

## 2010-08-01 DIAGNOSIS — L03319 Cellulitis of trunk, unspecified: Secondary | ICD-10-CM | POA: Insufficient documentation

## 2010-08-04 LAB — WOUND CULTURE: Culture: NORMAL

## 2010-08-05 ENCOUNTER — Emergency Department (HOSPITAL_COMMUNITY): Payer: Medicare Other

## 2010-08-05 ENCOUNTER — Emergency Department (HOSPITAL_COMMUNITY)
Admission: EM | Admit: 2010-08-05 | Discharge: 2010-08-06 | Disposition: A | Discharge status: Home / Self Care | Payer: Medicare Other | Attending: Emergency Medicine | Admitting: Emergency Medicine

## 2010-08-05 DIAGNOSIS — E119 Type 2 diabetes mellitus without complications: Secondary | ICD-10-CM | POA: Insufficient documentation

## 2010-08-05 DIAGNOSIS — R000 Tachycardia, unspecified: Secondary | ICD-10-CM | POA: Insufficient documentation

## 2010-08-05 DIAGNOSIS — R071 Chest pain on breathing: Secondary | ICD-10-CM | POA: Insufficient documentation

## 2010-08-05 DIAGNOSIS — I10 Essential (primary) hypertension: Secondary | ICD-10-CM | POA: Insufficient documentation

## 2010-08-05 DIAGNOSIS — F411 Generalized anxiety disorder: Secondary | ICD-10-CM | POA: Insufficient documentation

## 2010-08-05 DIAGNOSIS — Z794 Long term (current) use of insulin: Secondary | ICD-10-CM | POA: Insufficient documentation

## 2010-08-05 DIAGNOSIS — IMO0002 Reserved for concepts with insufficient information to code with codable children: Secondary | ICD-10-CM | POA: Insufficient documentation

## 2010-08-05 LAB — POCT CARDIAC MARKERS
CKMB, poc: 1.1 ng/mL (ref 1.0–8.0)
Myoglobin, poc: 57.3 ng/mL (ref 12–200)
Troponin i, poc: <0.05 ng/mL (ref 0.00–0.09)

## 2010-08-05 LAB — POCT I-STAT, CHEM 8
Calcium, Ion: 1.04 mmol/L — ABNORMAL LOW (ref 1.12–1.32)
Chloride: 102 mEq/L (ref 96–112)
HCT: 46 % (ref 36.0–46.0)
Potassium: 3.6 mEq/L (ref 3.5–5.1)
Sodium: 137 mEq/L (ref 135–145)

## 2010-08-07 ENCOUNTER — Emergency Department (HOSPITAL_COMMUNITY)
Admission: EM | Admit: 2010-08-07 | Discharge: 2010-08-07 | Disposition: A | Discharge status: Home / Self Care | Payer: Medicare Other | Attending: Emergency Medicine | Admitting: Emergency Medicine

## 2010-08-07 DIAGNOSIS — I10 Essential (primary) hypertension: Secondary | ICD-10-CM | POA: Insufficient documentation

## 2010-08-07 DIAGNOSIS — IMO0002 Reserved for concepts with insufficient information to code with codable children: Secondary | ICD-10-CM | POA: Insufficient documentation

## 2010-08-07 DIAGNOSIS — E119 Type 2 diabetes mellitus without complications: Secondary | ICD-10-CM | POA: Insufficient documentation

## 2010-08-07 DIAGNOSIS — Z794 Long term (current) use of insulin: Secondary | ICD-10-CM | POA: Insufficient documentation

## 2010-08-07 DIAGNOSIS — L02219 Cutaneous abscess of trunk, unspecified: Secondary | ICD-10-CM | POA: Insufficient documentation

## 2010-08-14 ENCOUNTER — Emergency Department (HOSPITAL_COMMUNITY)
Admission: EM | Admit: 2010-08-14 | Discharge: 2010-08-15 | Disposition: A | Discharge status: Home / Self Care | Payer: Medicare Other | Attending: Emergency Medicine | Admitting: Emergency Medicine

## 2010-08-14 ENCOUNTER — Emergency Department (HOSPITAL_COMMUNITY): Payer: Medicare Other

## 2010-08-14 DIAGNOSIS — R05 Cough: Secondary | ICD-10-CM | POA: Insufficient documentation

## 2010-08-14 DIAGNOSIS — E119 Type 2 diabetes mellitus without complications: Secondary | ICD-10-CM | POA: Insufficient documentation

## 2010-08-14 DIAGNOSIS — Z794 Long term (current) use of insulin: Secondary | ICD-10-CM | POA: Insufficient documentation

## 2010-08-14 DIAGNOSIS — R062 Wheezing: Secondary | ICD-10-CM | POA: Insufficient documentation

## 2010-08-14 DIAGNOSIS — I10 Essential (primary) hypertension: Secondary | ICD-10-CM | POA: Insufficient documentation

## 2010-08-14 DIAGNOSIS — J40 Bronchitis, not specified as acute or chronic: Secondary | ICD-10-CM | POA: Insufficient documentation

## 2010-08-14 DIAGNOSIS — R059 Cough, unspecified: Secondary | ICD-10-CM | POA: Insufficient documentation

## 2010-09-09 ENCOUNTER — Emergency Department (HOSPITAL_COMMUNITY)
Admission: EM | Admit: 2010-09-09 | Discharge: 2010-09-10 | Disposition: A | Discharge status: Home / Self Care | Payer: Medicare Other | Attending: Emergency Medicine | Admitting: Emergency Medicine

## 2010-09-09 ENCOUNTER — Emergency Department (HOSPITAL_COMMUNITY): Payer: Medicare Other

## 2010-09-09 DIAGNOSIS — Z794 Long term (current) use of insulin: Secondary | ICD-10-CM | POA: Insufficient documentation

## 2010-09-09 DIAGNOSIS — M79609 Pain in unspecified limb: Secondary | ICD-10-CM | POA: Insufficient documentation

## 2010-09-09 DIAGNOSIS — E119 Type 2 diabetes mellitus without complications: Secondary | ICD-10-CM | POA: Insufficient documentation

## 2010-09-09 DIAGNOSIS — I10 Essential (primary) hypertension: Secondary | ICD-10-CM | POA: Insufficient documentation

## 2010-09-09 DIAGNOSIS — I509 Heart failure, unspecified: Secondary | ICD-10-CM | POA: Insufficient documentation

## 2010-09-09 DIAGNOSIS — J45909 Unspecified asthma, uncomplicated: Secondary | ICD-10-CM | POA: Insufficient documentation

## 2010-09-09 DIAGNOSIS — R209 Unspecified disturbances of skin sensation: Secondary | ICD-10-CM | POA: Insufficient documentation

## 2010-09-09 DIAGNOSIS — Z862 Personal history of diseases of the blood and blood-forming organs and certain disorders involving the immune mechanism: Secondary | ICD-10-CM | POA: Insufficient documentation

## 2010-09-09 DIAGNOSIS — M5430 Sciatica, unspecified side: Secondary | ICD-10-CM | POA: Insufficient documentation

## 2010-09-09 DIAGNOSIS — G8929 Other chronic pain: Secondary | ICD-10-CM | POA: Insufficient documentation

## 2010-09-09 DIAGNOSIS — M129 Arthropathy, unspecified: Secondary | ICD-10-CM | POA: Insufficient documentation

## 2010-09-09 DIAGNOSIS — Z8639 Personal history of other endocrine, nutritional and metabolic disease: Secondary | ICD-10-CM | POA: Insufficient documentation

## 2010-09-09 DIAGNOSIS — M549 Dorsalgia, unspecified: Secondary | ICD-10-CM | POA: Insufficient documentation

## 2010-09-09 DIAGNOSIS — R29898 Other symptoms and signs involving the musculoskeletal system: Secondary | ICD-10-CM | POA: Insufficient documentation

## 2010-09-09 DIAGNOSIS — M1990 Unspecified osteoarthritis, unspecified site: Secondary | ICD-10-CM | POA: Insufficient documentation

## 2010-09-09 DIAGNOSIS — Z79899 Other long term (current) drug therapy: Secondary | ICD-10-CM | POA: Insufficient documentation

## 2010-10-01 ENCOUNTER — Emergency Department (HOSPITAL_COMMUNITY): Payer: Medicare Other

## 2010-10-01 ENCOUNTER — Inpatient Hospital Stay (HOSPITAL_COMMUNITY)
Admission: EM | Admit: 2010-10-01 | Discharge: 2010-10-05 | DRG: 078 | Disposition: A | Discharge status: Home / Self Care | Payer: Medicare Other | Attending: Internal Medicine | Admitting: Internal Medicine

## 2010-10-01 DIAGNOSIS — F17200 Nicotine dependence, unspecified, uncomplicated: Secondary | ICD-10-CM | POA: Diagnosis present

## 2010-10-01 DIAGNOSIS — I509 Heart failure, unspecified: Secondary | ICD-10-CM | POA: Diagnosis present

## 2010-10-01 DIAGNOSIS — IMO0001 Reserved for inherently not codable concepts without codable children: Secondary | ICD-10-CM | POA: Diagnosis present

## 2010-10-01 DIAGNOSIS — N183 Chronic kidney disease, stage 3 unspecified: Secondary | ICD-10-CM | POA: Diagnosis present

## 2010-10-01 DIAGNOSIS — Z6841 Body Mass Index (BMI) 40.0 and over, adult: Secondary | ICD-10-CM

## 2010-10-01 DIAGNOSIS — I5032 Chronic diastolic (congestive) heart failure: Secondary | ICD-10-CM | POA: Diagnosis present

## 2010-10-01 DIAGNOSIS — I129 Hypertensive chronic kidney disease with stage 1 through stage 4 chronic kidney disease, or unspecified chronic kidney disease: Secondary | ICD-10-CM | POA: Diagnosis present

## 2010-10-01 DIAGNOSIS — R51 Headache: Secondary | ICD-10-CM | POA: Diagnosis present

## 2010-10-01 DIAGNOSIS — I674 Hypertensive encephalopathy: Principal | ICD-10-CM | POA: Diagnosis present

## 2010-10-01 LAB — DIFFERENTIAL
Basophils Absolute: 0 10*3/uL (ref 0.0–0.1)
Lymphocytes Relative: 28 % (ref 12–46)
Monocytes Absolute: 0.5 10*3/uL (ref 0.1–1.0)
Monocytes Relative: 7 % (ref 3–12)
Neutro Abs: 4.3 10*3/uL (ref 1.7–7.7)

## 2010-10-01 LAB — URINE MICROSCOPIC-ADD ON

## 2010-10-01 LAB — URINALYSIS, ROUTINE W REFLEX MICROSCOPIC
Bilirubin Urine: NEGATIVE
Glucose, UA: >1000 mg/dL — AB
Specific Gravity, Urine: 1.022 (ref 1.005–1.030)
Urobilinogen, UA: 0.2 mg/dL (ref 0.0–1.0)
pH: 6 (ref 5.0–8.0)

## 2010-10-01 LAB — COMPREHENSIVE METABOLIC PANEL
AST: 15 U/L (ref 0–37)
Albumin: 3.7 g/dL (ref 3.5–5.2)
Chloride: 97 mEq/L (ref 96–112)
Creatinine, Ser: 1.31 mg/dL — ABNORMAL HIGH (ref 0.4–1.2)
GFR calc Af Amer: 56 mL/min — ABNORMAL LOW (ref >60)
Total Bilirubin: 0.4 mg/dL (ref 0.3–1.2)
Total Protein: 7.5 g/dL (ref 6.0–8.3)

## 2010-10-01 LAB — CBC
HCT: 42.6 % (ref 36.0–46.0)
Hemoglobin: 14 g/dL (ref 12.0–15.0)
MCH: 29.7 pg (ref 26.0–34.0)
MCHC: 32.9 g/dL (ref 30.0–36.0)

## 2010-10-01 LAB — POCT CARDIAC MARKERS
CKMB, poc: <1 ng/mL — ABNORMAL LOW (ref 1.0–8.0)
Myoglobin, poc: 63 ng/mL (ref 12–200)
Troponin i, poc: <0.05 ng/mL (ref 0.00–0.09)

## 2010-10-02 LAB — GLUCOSE, CAPILLARY
Glucose-Capillary: 295 mg/dL — ABNORMAL HIGH (ref 70–99)
Glucose-Capillary: 323 mg/dL — ABNORMAL HIGH (ref 70–99)
Glucose-Capillary: 391 mg/dL — ABNORMAL HIGH (ref 70–99)

## 2010-10-02 LAB — DIFFERENTIAL
Basophils Relative: 0 % (ref 0–1)
Eosinophils Absolute: 0.1 10*3/uL (ref 0.0–0.7)
Eosinophils Relative: 1 % (ref 0–5)
Lymphs Abs: 2 10*3/uL (ref 0.7–4.0)
Monocytes Absolute: 0.5 10*3/uL (ref 0.1–1.0)
Monocytes Relative: 8 % (ref 3–12)

## 2010-10-02 LAB — BASIC METABOLIC PANEL
BUN: 10 mg/dL (ref 6–23)
Calcium: 9.4 mg/dL (ref 8.4–10.5)
Chloride: 99 mEq/L (ref 96–112)
Creatinine, Ser: 1.15 mg/dL (ref 0.4–1.2)
GFR calc Af Amer: >60 mL/min (ref >60)

## 2010-10-02 LAB — CARDIAC PANEL(CRET KIN+CKTOT+MB+TROPI)
CK, MB: 2 ng/mL (ref 0.3–4.0)
Relative Index: INVALID (ref 0.0–2.5)
Relative Index: INVALID (ref 0.0–2.5)
Troponin I: <0.3 ng/mL (ref <0.30)
Troponin I: <0.3 ng/mL (ref <0.30)

## 2010-10-02 LAB — HEMOGLOBIN A1C: Hgb A1c MFr Bld: 11.3 % — ABNORMAL HIGH (ref <5.7)

## 2010-10-02 LAB — LIPID PANEL
Cholesterol: 146 mg/dL (ref 0–200)
HDL: 56 mg/dL (ref >39)
LDL Cholesterol: 54 mg/dL (ref 0–99)
Triglycerides: 178 mg/dL — ABNORMAL HIGH (ref <150)

## 2010-10-02 LAB — TSH: TSH: 1.27 u[IU]/mL (ref 0.350–4.500)

## 2010-10-02 LAB — PROTIME-INR
INR: 0.97 (ref 0.00–1.49)
Prothrombin Time: 13.1 seconds (ref 11.6–15.2)

## 2010-10-02 LAB — MRSA PCR SCREENING: MRSA by PCR: POSITIVE — AB

## 2010-10-02 LAB — CBC
MCH: 29.6 pg (ref 26.0–34.0)
MCHC: 32.6 g/dL (ref 30.0–36.0)
MCV: 90.9 fL (ref 78.0–100.0)
Platelets: 285 10*3/uL (ref 150–400)

## 2010-10-02 LAB — RAPID URINE DRUG SCREEN, HOSP PERFORMED: Tetrahydrocannabinol: NOT DETECTED

## 2010-10-03 ENCOUNTER — Inpatient Hospital Stay (HOSPITAL_COMMUNITY): Payer: Medicare Other

## 2010-10-03 LAB — GLUCOSE, CAPILLARY
Glucose-Capillary: 227 mg/dL — ABNORMAL HIGH (ref 70–99)
Glucose-Capillary: 291 mg/dL — ABNORMAL HIGH (ref 70–99)
Glucose-Capillary: 222 mg/dL — ABNORMAL HIGH (ref 70–99)

## 2010-10-03 LAB — BASIC METABOLIC PANEL
Calcium: 9.2 mg/dL (ref 8.4–10.5)
GFR calc Af Amer: >60 mL/min (ref >60)
GFR calc non Af Amer: 53 mL/min — ABNORMAL LOW (ref >60)
Glucose, Bld: 273 mg/dL — ABNORMAL HIGH (ref 70–99)
Sodium: 135 mEq/L (ref 135–145)

## 2010-10-04 LAB — BASIC METABOLIC PANEL
CO2: 26 mEq/L (ref 19–32)
Calcium: 9.4 mg/dL (ref 8.4–10.5)
Creatinine, Ser: 1.19 mg/dL (ref 0.4–1.2)
GFR calc Af Amer: >60 mL/min (ref >60)
Glucose, Bld: 235 mg/dL — ABNORMAL HIGH (ref 70–99)

## 2010-10-04 LAB — GLUCOSE, CAPILLARY: Glucose-Capillary: 256 mg/dL — ABNORMAL HIGH (ref 70–99)

## 2010-10-05 LAB — GLUCOSE, CAPILLARY
Glucose-Capillary: 257 mg/dL — ABNORMAL HIGH (ref 70–99)
Glucose-Capillary: 184 mg/dL — ABNORMAL HIGH (ref 70–99)

## 2010-10-05 LAB — BASIC METABOLIC PANEL
Calcium: 8.9 mg/dL (ref 8.4–10.5)
Creatinine, Ser: 1.22 mg/dL — ABNORMAL HIGH (ref 0.4–1.2)
GFR calc Af Amer: >60 mL/min (ref >60)
GFR calc non Af Amer: 50 mL/min — ABNORMAL LOW (ref >60)

## 2010-10-09 NOTE — Discharge Summary (Signed)
NAME:  Debra Stokes, Debra Stokes NO.:  [id number]  MEDICAL RECORD NO.:  [id number]           PATIENT TYPE:  I  LOCATION:  1418                         FACILITY:  [facility]  PHYSICIAN:  Isidor Holts, M.D.  DATE OF BIRTH:  02/28/1976  DATE OF ADMISSION:  10/01/2010 DATE OF DISCHARGE:  10/05/2010                              DISCHARGE SUMMARY   PRIMARY MD:  Lonia Blood, MD  DISCHARGE DIAGNOSES: 1. Hypertensive urgency/encephalopathy. 2. History of diastolic congestive heart failure. 3. Type 2 diabetes mellitus, uncontrolled. 4. Morbid obesity. 5. Smoking history. 6. Stage 3 CKD. 7. Gout. 8. Asthma.  DISCHARGE MEDICATIONS: 1. Amlodipine 10 mg p.o. daily. 2. Furosemide 20 mg p.o. daily. 3. NovoLog insulin per sliding scale as follows:  For CBG 70-120, no     insulin; CBG 121-150, 2 units; CBG 151-200, 3 units; CBG 201-250, 5     units; CBG 251-300, 8 units; CBG 301-350, 11 units; CBG 351-400, 15     units. 4. Lisinopril 10 mg p.o. daily. 5. Lopressor 75 mg p.o. b.i.d. 6. Albuterol inhaler 2 puffs p.r.n. q.4 hourly for shortness of     breath. 7. Albuterol nebulizer 1 treatment p.r.n. q.4 hourly for shortness of     breath. 8. Aleve 220 mg 2 tablets p.o. p.r.n. q.8 h. for pain. 9. Lantus insulin FlexiPen 35 units subcutaneously b.i.d..  PROCEDURES: 1. Chest x-ray on Oct 01, 2010.  This showed stable cardiomegaly.  The     lungs were grossly clear without frank interstitial edema. 2. Head CT scan, Oct 01, 2010.  This showed no acute intracranial     abnormality. 3. Head CT scan on Oct 03, 2010.  This showed no acute intracranial     abnormality and no interval change.  CONSULTATIONS:  None.  ADMISSION HISTORY:  As in H and P notes of Oct 01, 2010, dictated by Dr. Houston Siren.  However, in brief, this is a 35 year old female, with known history of morbid obesity, diastolic congestive heart failure, hypertension, type 2 diabetes mellitus, stage 3 kidney disease,  gout, smoking history, presenting with severe headache located at vertex, of 24- to 48-hour duration.  On initial evaluation, she was found to have significantly elevated blood pressure of 232/151 mmHg.  She was commenced on Cardene drip and admitted for further evaluation, investigation and management.  HOSPITAL COURSE: 1. Severe uncontrolled hypertension/hypertensive urgency.  The patient     presented as described above.  She was managed initially with     Cardene drip and subsequently a combination of ACE inhibitor, beta-     blocker and calcium channel blocker, with satisfactory clinical     response.  As of Oct 05, 2010, BP was far more reasonably     controlled at 146/93 mmHg.  Further improvement is anticipated.  2. Hypertensive encephalopathy.  The patient did present with a     headache.  Imaging studies were unremarkable for acute intracranial     pathology and clinically she had no focal neurologic deficits.     Headache improved with control of BP, and is most likely secondary to  hypertensive urgency.  3. History of diastolic congestive heart failure.  The patient showed     no clinical evidence of heart failure during the course of this     hospitalization.  She is maintained on low-dose Lasix.  4. Type 2 diabetes mellitus.  This is insulin requiring, and was     managed with combination of carbohydrate-modified diet, sliding     scale insulin coverage, and scheduled Lantus insulin.  5. History of CKD.  The patient's renal status was stable.  As a matter     of fact, BUN on Oct 05, 2010, was 16 with a creatinine of 1.22.  6. Bronchial asthma.  The patient was asymptomatic from this viewpoint     during the course of this hospitalization.  7. Smoking history.  The smokes 1/2 packet of cigarettes per day.  She     has been counseled appropriately.  DISPOSITION:  The patient was on Oct 05, 2010, asymptomatic and keen to be discharged, with no new issues.  She was  therefore considered clinically stable for discharge and discharged accordingly.  ACTIVITY:  As tolerated.  DIET:  Heart healthy/carbohydrate modified.  FOLLOWUP INSTRUCTIONS:  The patient is to follow up with Dr. Lonia Blood on a date to be determined, but certainly within the coming week. Telephone number [phone].  She has been instructed to call for an appointment and has verbalized understanding.     Isidor Holts, M.D.     CO/MEDQ  D:  10/05/2010  T:  10/05/2010  Job:  [id number]  cc:   Lonia Blood, M.D.  Electronically Signed by Isidor Holts M.D. on 10/09/2010 05:38:32 PM

## 2010-10-11 NOTE — H&P (Signed)
NAME:  Debra Stokes, KOMAR NO.:  [id number]  MEDICAL RECORD NO.:  [id number]           PATIENT TYPE:  I  LOCATION:  1228                         FACILITY:  [facility]  PHYSICIAN:  Houston Siren, MD           DATE OF BIRTH:  04-Apr-1976  DATE OF ADMISSION:  10/01/2010 DATE OF DISCHARGE:                             HISTORY & PHYSICAL   CHIEF COMPLAINT:  Headache.  HISTORY OF PRESENT ILLNESS:  This 35 year old female has no current primary care physician, presents to [facility] Emergency Room with a 24- to 48-hour history of vertex headache. She states normally she has frontal facial type headaches and never had a headache in this location.  She complains of the site feeling like somebody is sticking "pins and needles."  She had a recent hospitalization through May 31, 2010, with new congestive heart failure thought secondary to uncontrolled hypertension.  She was discharged on clonidine 0.3 mg twice daily, metoprolol 25 mg twice daily, and Lasix 20 mg once daily. Because she has no primary care physician, she has only been taking clonidine which she says she has been taking in a dose of 0.3 mg 3 times daily.  She does not believe that there has been any weight gain or increased edema, but cannot be sure as she has no home scale to check her weight.  The ER workup found her with hypertensive urgency, initial blood pressure 132/151.  She was started on a Cardene drip and her latest blood pressure has improved to 164/102.  She is also noted with a fairly significantly elevated blood glucose at 354.  She is referred for admission to [facility] and will be assigned to team 2.  PAST MEDICAL HISTORY: 1. Diastolic congestive heart failure thought secondary to     hypertensive urgency with last echocardiogram May 29, 2010. 2. Uncontrolled hypertension. 3. Uncontrolled diabetes. 4. Subacute bronchitis. 5. Stage 3 kidney disease. 6. Morbid obesity. 7. History of  gout. 8. Reported cardiac catheterization at [facility] in 2009 finding     no coronary artery disease per the patient's report. 9. Asthma. 10.Pancreatitis of unclear etiology. 11.Prior cholecystectomy. 12.Tobacco smoker for approximately 16 years, about 1/2 pack daily.  FAMILY HISTORY:  Mother is living with a past history of hypertension, CVA, and sciatica.  Father is living with a history of diabetes, liver disease, and degenerative joint disease.  Family propensity for hypertension and diabetes.  SOCIAL HISTORY:  The patient lives with a "life partner."  She does not work and has been on disability.  One-half pack per day cigarette smoker for the last 16 years.  Denies any illicit drugs or significant alcohol.  REVIEW OF SYSTEMS:  EYES:  States she had a burst blood vessel twice over the last 2 weeks.  It is not currently evident.  EARS:  Denies hearing loss, but states she does have some tinnitus.  NOSE:  No rhinitis or sinusitis.  MOUTH:  No oral or dental pain.  No dysphagia. CARDIAC:  Again, she had a cardiac catheterization in 2009 that she reports is negative at [facility].  Newly diagnosed congestive heart failure, January 2012, diastolic due to poorly controlled hypertension. Denies any central chest pain or palpitation.  LUNGS:  States she has a history of asthma.  Describes what sounds like some orthopnea recently. No cough or increased sputum.  ABDOMEN:  No nausea or vomiting.  No constipation or diarrhea.  MUSCULOSKELETAL:  States she does have arthritis, mainly in the knees.  Also, has some chronic back pain. Denies any falls or trauma.  NEUROLOGIC:  Denies stroke or seizure. Denies any unilateral or focal changes.  HEMATOLOGIC:  No abnormal bleeding or bruising.  CURRENT MEDICATIONS:  States she takes: 1. Lantus insulin 35 units twice daily. 2. Glipizide 10 mg daily. 3. Clonidine 0.3 mg 3 times daily.  States she has no primary care physician and has been  unable to obtain other medications as per last hospital discharge summary, May 31, 2010.  She states she has been using prescriptions from a previous physician that she no longer sees since she moved to [place].  ALLERGIES:  She has no known drug allergies.  PHYSICAL EXAMINATION:  VITAL SIGNS:  Admitting blood pressure 232/151, current 164/102 on Cardene drip.  Pulse 89, respirations 22, temperature 98.2.  O2 sats 99% on room air. GENERAL APPEARANCE:  This is a morbidly obese female in no distress. She is alert, cooperative, oriented. HEENT:  Head normocephalic.  Eyes:  Pupils equal.  Ears:  Canals clear and hearing normal to conversational tone.  Nose:  Nares patent.  No discharge noted.  Oral mucosa pink and moist. NECK:  No jugular venous distention, bruits, adenopathy, or thyromegaly. CARDIAC:  Rate and rhythm are regular with heart sounds distant secondary to girth.  No murmur, S3, S4.  She has trace pedal edema, foot and ankle bilaterally.  No sacral edema is noted. LUNGS:  Reduced in all fields probably secondary to her girth.  No crackles are appreciated.  No distress or cough. ABDOMEN:  Soft with positive bowel sounds.  She is morbidly obese with a large pannus.  No guarding or rebound tenderness.  No bruits or masses noted. URINARY/GENITAL:  No bladder pain or CVA tenderness. MUSCULOSKELETAL:  Range of motion is full.  There is some pain with passive range of motion, particularly in the right knee.  Strength appears 5/5 and equal x4. NEUROLOGIC:  Cranial nerves II through XII grossly intact.  No unilateral or focal defects.  She is alert and oriented. HEMATOLOGIC:  No abnormal bleeding or bruising.  RADIOLOGY AND LABORATORY DATA:  CT scan of the head without contrast found no acute intracranial abnormality.  Two-view chest x-ray notes stable cardiomegaly.  Point-of-care cardiac markers set 1; CK-MB less than 1.0, troponin less than 0.05, myoglobin 58.2.  Cardiac  markers set 2; CK-MB less than 1.0, troponin less than 0.05, myoglobin 63.  Urine microscopic was essentially unremarkable.  Urinalysis greater than 1000 glucose, negative protein, but large blood.  Comprehensive metabolic panel; sodium 136, potassium 3.8, chloride 97, CO2 of 29, BUN 10, and creatinine 1.31.  Her creatinine appears baseline from prior labs, 1.1 to 1.5 range.  Glucose elevated 351.  GFR 56.  Liver function unremarkable.  Urine pregnancy negative.  CBC with differential found WBC of 6.7, hemoglobin 14.0, hematocrit 42.6, platelets 336. Differential was unremarkable.  IMPRESSION/PLAN: 1. Hypertensive urgency.  We will continue her on the Cardene drip 1     to 5 mg per hour with goal systolic blood pressure 150 to 180 range     and diastolic less  than 100.  We can add back clonidine 0.3 mg     twice daily and metoprolol 25 mg twice daily per discretion of the     rounding physician when seen later this morning.  We will obtain a     12-lead EKG as I do not see one in her current chart.  I will defer     an echocardiogram as she has had one this year in January.  We will     check cardiac enzymes x3 as well as a fasting lipid panel.  We will     do a urine drug screen to eliminate cocaine.  We will also check a     TSH. 2. Congestive heart failure.  Again, she had a prior echocardiogram     May 29, 2010, suggesting diastolic failure.  I will place her     on Lasix 20 mg IV daily as there may be some volume overload here     given her complaints of orthopnea.  We will follow daily weights     and I and O closely.  We will also need to follow her renal     function closely given her chronic renal insufficiency.  We will     recheck a basic metabolic panel in the a.m. 3. Uncontrolled diabetes.  I will place her on a carb-modified diet     and start her on CBGs q.4 h. with sensitive NovoLog sliding scale     insulin.  Can restart glipizide or other medication per  rounder's     discretion post admission. 4. Chronic renal insufficiency.  Again, her baseline creatinine 1.1 to     1.5 range and today's value is stable at 1.31.  We will minimize     her fluids with saline lock and diuresis as above for her history     of congestive heart failure.  Follow up basic metabolic panel in     a.m. 5. Morbid obesity.  I have requested a dietary counseling for this     reason. 6. Tobacco abuse.  She is open to cessation consult, but declines     nicotine patch. 7. Deep venous thrombosis prophylaxis.  Lovenox 40 mg subcu daily. 8. Code status.  The patient will be full code.     Everett Graff, N.P.   ______________________________ Houston Siren, MD    TC/MEDQ  D:  10/02/2010  T:  10/02/2010  Job:  [id number]  Electronically Signed by Everett Graff N.P. on 10/02/2010 06:53:51 PM Electronically Signed by Houston Siren  on 10/11/2010 09:11:36 PM

## 2010-10-23 ENCOUNTER — Emergency Department (HOSPITAL_COMMUNITY): Payer: Medicare Other

## 2010-10-23 ENCOUNTER — Emergency Department (HOSPITAL_COMMUNITY)
Admission: EM | Admit: 2010-10-23 | Discharge: 2010-10-23 | Disposition: A | Discharge status: Home / Self Care | Payer: Medicare Other | Attending: Emergency Medicine | Admitting: Emergency Medicine

## 2010-10-23 DIAGNOSIS — M1990 Unspecified osteoarthritis, unspecified site: Secondary | ICD-10-CM | POA: Insufficient documentation

## 2010-10-23 DIAGNOSIS — M109 Gout, unspecified: Secondary | ICD-10-CM | POA: Insufficient documentation

## 2010-10-23 DIAGNOSIS — Z79899 Other long term (current) drug therapy: Secondary | ICD-10-CM | POA: Insufficient documentation

## 2010-10-23 DIAGNOSIS — R7309 Other abnormal glucose: Secondary | ICD-10-CM | POA: Insufficient documentation

## 2010-10-23 DIAGNOSIS — I509 Heart failure, unspecified: Secondary | ICD-10-CM | POA: Insufficient documentation

## 2010-10-23 DIAGNOSIS — J45909 Unspecified asthma, uncomplicated: Secondary | ICD-10-CM | POA: Insufficient documentation

## 2010-10-23 DIAGNOSIS — E119 Type 2 diabetes mellitus without complications: Secondary | ICD-10-CM | POA: Insufficient documentation

## 2010-10-23 DIAGNOSIS — Z794 Long term (current) use of insulin: Secondary | ICD-10-CM | POA: Insufficient documentation

## 2010-10-23 DIAGNOSIS — I10 Essential (primary) hypertension: Secondary | ICD-10-CM | POA: Insufficient documentation

## 2010-10-23 DIAGNOSIS — R51 Headache: Secondary | ICD-10-CM | POA: Insufficient documentation

## 2010-10-23 DIAGNOSIS — M549 Dorsalgia, unspecified: Secondary | ICD-10-CM | POA: Insufficient documentation

## 2010-10-23 DIAGNOSIS — M129 Arthropathy, unspecified: Secondary | ICD-10-CM | POA: Insufficient documentation

## 2010-10-23 LAB — URINALYSIS, ROUTINE W REFLEX MICROSCOPIC
Leukocytes, UA: NEGATIVE
Nitrite: NEGATIVE
Specific Gravity, Urine: 1.022 (ref 1.005–1.030)
pH: 5.5 (ref 5.0–8.0)

## 2010-10-23 LAB — DIFFERENTIAL
Basophils Relative: 0 % (ref 0–1)
Eosinophils Absolute: 0.1 10*3/uL (ref 0.0–0.7)
Monocytes Absolute: 0.6 10*3/uL (ref 0.1–1.0)
Monocytes Relative: 8 % (ref 3–12)

## 2010-10-23 LAB — COMPREHENSIVE METABOLIC PANEL
Albumin: 3.4 g/dL — ABNORMAL LOW (ref 3.5–5.2)
BUN: 16 mg/dL (ref 6–23)
Creatinine, Ser: 1.1 mg/dL (ref 0.4–1.2)
Potassium: 3.7 mEq/L (ref 3.5–5.1)
Total Protein: 7.6 g/dL (ref 6.0–8.3)

## 2010-10-23 LAB — URINE MICROSCOPIC-ADD ON

## 2010-10-23 LAB — CBC
MCH: 29 pg (ref 26.0–34.0)
MCHC: 32.1 g/dL (ref 30.0–36.0)
Platelets: 286 10*3/uL (ref 150–400)

## 2010-10-23 LAB — POCT PREGNANCY, URINE: Preg Test, Ur: NEGATIVE

## 2010-10-25 LAB — URINE CULTURE

## 2010-11-08 ENCOUNTER — Emergency Department (HOSPITAL_COMMUNITY)
Admission: EM | Admit: 2010-11-08 | Discharge: 2010-11-09 | Disposition: A | Discharge status: Home / Self Care | Payer: Medicare Other | Attending: Emergency Medicine | Admitting: Emergency Medicine

## 2010-11-08 ENCOUNTER — Emergency Department (HOSPITAL_COMMUNITY): Payer: Medicare Other

## 2010-11-08 DIAGNOSIS — E119 Type 2 diabetes mellitus without complications: Secondary | ICD-10-CM | POA: Insufficient documentation

## 2010-11-08 DIAGNOSIS — R51 Headache: Secondary | ICD-10-CM | POA: Insufficient documentation

## 2010-11-08 DIAGNOSIS — I10 Essential (primary) hypertension: Secondary | ICD-10-CM | POA: Insufficient documentation

## 2010-11-08 DIAGNOSIS — E871 Hypo-osmolality and hyponatremia: Secondary | ICD-10-CM | POA: Insufficient documentation

## 2010-11-08 DIAGNOSIS — H538 Other visual disturbances: Secondary | ICD-10-CM | POA: Insufficient documentation

## 2010-11-08 DIAGNOSIS — I509 Heart failure, unspecified: Secondary | ICD-10-CM | POA: Insufficient documentation

## 2010-11-08 DIAGNOSIS — Z794 Long term (current) use of insulin: Secondary | ICD-10-CM | POA: Insufficient documentation

## 2010-11-08 DIAGNOSIS — R42 Dizziness and giddiness: Secondary | ICD-10-CM | POA: Insufficient documentation

## 2010-11-08 LAB — BASIC METABOLIC PANEL
BUN: 12 mg/dL (ref 6–23)
Calcium: 9.3 mg/dL (ref 8.4–10.5)
GFR calc non Af Amer: 51 mL/min — ABNORMAL LOW (ref >60)
Glucose, Bld: 267 mg/dL — ABNORMAL HIGH (ref 70–99)
Sodium: 131 mEq/L — ABNORMAL LOW (ref 135–145)

## 2010-11-08 LAB — CK TOTAL AND CKMB (NOT AT ARMC)
CK, MB: 2.1 ng/mL (ref 0.3–4.0)
Total CK: 120 U/L (ref 7–177)

## 2010-11-08 LAB — DIFFERENTIAL
Basophils Absolute: 0 10*3/uL (ref 0.0–0.1)
Basophils Relative: 0 % (ref 0–1)
Monocytes Absolute: 0.6 10*3/uL (ref 0.1–1.0)
Neutro Abs: 5.6 10*3/uL (ref 1.7–7.7)
Neutrophils Relative %: 66 % (ref 43–77)

## 2010-11-08 LAB — CBC
Hemoglobin: 13.8 g/dL (ref 12.0–15.0)
MCHC: 32.5 g/dL (ref 30.0–36.0)
RBC: 4.7 MIL/uL (ref 3.87–5.11)

## 2010-11-08 LAB — GLUCOSE, CAPILLARY: Glucose-Capillary: 292 mg/dL — ABNORMAL HIGH (ref 70–99)

## 2010-11-09 LAB — URINALYSIS, ROUTINE W REFLEX MICROSCOPIC
Bilirubin Urine: NEGATIVE
Hgb urine dipstick: NEGATIVE
Ketones, ur: NEGATIVE mg/dL
Protein, ur: 100 mg/dL — AB
Urobilinogen, UA: 0.2 mg/dL (ref 0.0–1.0)

## 2010-11-20 ENCOUNTER — Emergency Department (HOSPITAL_COMMUNITY)
Admission: EM | Admit: 2010-11-20 | Discharge: 2010-11-20 | Disposition: A | Discharge status: Home / Self Care | Payer: Medicare Other | Attending: Emergency Medicine | Admitting: Emergency Medicine

## 2010-11-20 DIAGNOSIS — M5430 Sciatica, unspecified side: Secondary | ICD-10-CM | POA: Insufficient documentation

## 2010-11-20 DIAGNOSIS — Z794 Long term (current) use of insulin: Secondary | ICD-10-CM | POA: Insufficient documentation

## 2010-11-20 DIAGNOSIS — I509 Heart failure, unspecified: Secondary | ICD-10-CM | POA: Insufficient documentation

## 2010-11-20 DIAGNOSIS — I10 Essential (primary) hypertension: Secondary | ICD-10-CM | POA: Insufficient documentation

## 2010-11-20 DIAGNOSIS — M545 Low back pain, unspecified: Secondary | ICD-10-CM | POA: Insufficient documentation

## 2010-11-20 DIAGNOSIS — E119 Type 2 diabetes mellitus without complications: Secondary | ICD-10-CM | POA: Insufficient documentation

## 2010-11-22 ENCOUNTER — Other Ambulatory Visit: Payer: Medicare Other

## 2010-11-22 ENCOUNTER — Other Ambulatory Visit: Payer: Self-pay | Admitting: Emergency Medicine

## 2010-11-22 DIAGNOSIS — M549 Dorsalgia, unspecified: Secondary | ICD-10-CM

## 2010-11-25 ENCOUNTER — Ambulatory Visit
Admission: RE | Admit: 2010-11-25 | Discharge: 2010-11-25 | Disposition: A | Discharge status: Home / Self Care | Payer: Medicare Other | Source: Ambulatory Visit | Attending: Emergency Medicine | Admitting: Emergency Medicine

## 2010-11-25 DIAGNOSIS — M549 Dorsalgia, unspecified: Secondary | ICD-10-CM

## 2010-12-02 ENCOUNTER — Emergency Department (HOSPITAL_COMMUNITY)
Admission: EM | Admit: 2010-12-02 | Discharge: 2010-12-02 | Disposition: A | Discharge status: Home / Self Care | Payer: Medicare Other | Attending: Emergency Medicine | Admitting: Emergency Medicine

## 2010-12-02 DIAGNOSIS — L02219 Cutaneous abscess of trunk, unspecified: Secondary | ICD-10-CM | POA: Insufficient documentation

## 2010-12-02 DIAGNOSIS — Z794 Long term (current) use of insulin: Secondary | ICD-10-CM | POA: Insufficient documentation

## 2010-12-02 DIAGNOSIS — I10 Essential (primary) hypertension: Secondary | ICD-10-CM | POA: Insufficient documentation

## 2010-12-02 DIAGNOSIS — E119 Type 2 diabetes mellitus without complications: Secondary | ICD-10-CM | POA: Insufficient documentation

## 2010-12-02 DIAGNOSIS — L03319 Cellulitis of trunk, unspecified: Secondary | ICD-10-CM | POA: Insufficient documentation

## 2010-12-03 ENCOUNTER — Emergency Department (HOSPITAL_COMMUNITY): Payer: Medicare Other

## 2010-12-03 ENCOUNTER — Inpatient Hospital Stay (HOSPITAL_COMMUNITY)
Admission: EM | Admit: 2010-12-03 | Discharge: 2010-12-05 | DRG: 392 | Disposition: A | Discharge status: Home / Self Care | Payer: Medicare Other | Attending: Internal Medicine | Admitting: Internal Medicine

## 2010-12-03 DIAGNOSIS — I5022 Chronic systolic (congestive) heart failure: Secondary | ICD-10-CM | POA: Diagnosis present

## 2010-12-03 DIAGNOSIS — F329 Major depressive disorder, single episode, unspecified: Secondary | ICD-10-CM | POA: Diagnosis present

## 2010-12-03 DIAGNOSIS — F3289 Other specified depressive episodes: Secondary | ICD-10-CM | POA: Diagnosis present

## 2010-12-03 DIAGNOSIS — G43909 Migraine, unspecified, not intractable, without status migrainosus: Secondary | ICD-10-CM | POA: Diagnosis present

## 2010-12-03 DIAGNOSIS — R0789 Other chest pain: Secondary | ICD-10-CM | POA: Diagnosis present

## 2010-12-03 DIAGNOSIS — IMO0001 Reserved for inherently not codable concepts without codable children: Secondary | ICD-10-CM | POA: Diagnosis present

## 2010-12-03 DIAGNOSIS — J45909 Unspecified asthma, uncomplicated: Secondary | ICD-10-CM | POA: Diagnosis present

## 2010-12-03 DIAGNOSIS — I10 Essential (primary) hypertension: Secondary | ICD-10-CM | POA: Diagnosis present

## 2010-12-03 DIAGNOSIS — F17200 Nicotine dependence, unspecified, uncomplicated: Secondary | ICD-10-CM | POA: Diagnosis present

## 2010-12-03 DIAGNOSIS — F1410 Cocaine abuse, uncomplicated: Secondary | ICD-10-CM | POA: Diagnosis present

## 2010-12-03 DIAGNOSIS — E669 Obesity, unspecified: Secondary | ICD-10-CM | POA: Diagnosis present

## 2010-12-03 DIAGNOSIS — K219 Gastro-esophageal reflux disease without esophagitis: Principal | ICD-10-CM | POA: Diagnosis present

## 2010-12-03 DIAGNOSIS — I509 Heart failure, unspecified: Secondary | ICD-10-CM | POA: Diagnosis present

## 2010-12-03 LAB — GLUCOSE, CAPILLARY: Glucose-Capillary: 288 mg/dL — ABNORMAL HIGH (ref 70–99)

## 2010-12-04 ENCOUNTER — Emergency Department (HOSPITAL_COMMUNITY): Payer: Medicare Other

## 2010-12-04 LAB — BASIC METABOLIC PANEL
BUN: 22 mg/dL (ref 6–23)
CO2: 28 mEq/L (ref 19–32)
Calcium: 9.8 mg/dL (ref 8.4–10.5)
Creatinine, Ser: 1.35 mg/dL — ABNORMAL HIGH (ref 0.50–1.10)
Glucose, Bld: 241 mg/dL — ABNORMAL HIGH (ref 70–99)

## 2010-12-04 LAB — URINALYSIS, ROUTINE W REFLEX MICROSCOPIC
Glucose, UA: 100 mg/dL — AB
Leukocytes, UA: NEGATIVE
Nitrite: NEGATIVE
Specific Gravity, Urine: 1.037 — ABNORMAL HIGH (ref 1.005–1.030)
Urobilinogen, UA: 0.2 mg/dL (ref 0.0–1.0)
pH: 5.5 (ref 5.0–8.0)

## 2010-12-04 LAB — GLUCOSE, CAPILLARY
Glucose-Capillary: 206 mg/dL — ABNORMAL HIGH (ref 70–99)
Glucose-Capillary: 198 mg/dL — ABNORMAL HIGH (ref 70–99)
Glucose-Capillary: 219 mg/dL — ABNORMAL HIGH (ref 70–99)

## 2010-12-04 LAB — RAPID URINE DRUG SCREEN, HOSP PERFORMED
Amphetamines: NOT DETECTED
Amphetamines: NOT DETECTED
Amphetamines: NOT DETECTED
Barbiturates: NOT DETECTED
Benzodiazepines: NOT DETECTED
Benzodiazepines: NOT DETECTED
Benzodiazepines: NOT DETECTED
Cocaine: POSITIVE — AB
Opiates: POSITIVE — AB
Opiates: NOT DETECTED
Tetrahydrocannabinol: NOT DETECTED

## 2010-12-04 LAB — CARDIAC PANEL(CRET KIN+CKTOT+MB+TROPI)
Relative Index: 1.7 (ref 0.0–2.5)
Relative Index: 1.5 (ref 0.0–2.5)
Troponin I: <0.3 ng/mL (ref <0.30)

## 2010-12-04 LAB — CK TOTAL AND CKMB (NOT AT ARMC)
CK, MB: 1.7 ng/mL (ref 0.3–4.0)
Relative Index: INVALID (ref 0.0–2.5)
Total CK: 125 U/L (ref 7–177)
Total CK: 77 U/L (ref 7–177)
Total CK: 117 U/L (ref 7–177)

## 2010-12-04 LAB — DIFFERENTIAL
Lymphocytes Relative: 26 % (ref 12–46)
Lymphs Abs: 2.4 10*3/uL (ref 0.7–4.0)
Neutro Abs: 5.7 10*3/uL (ref 1.7–7.7)
Neutrophils Relative %: 63 % (ref 43–77)

## 2010-12-04 LAB — CBC
HCT: 42.8 % (ref 36.0–46.0)
MCV: 90.5 fL (ref 78.0–100.0)
RBC: 4.73 MIL/uL (ref 3.87–5.11)
WBC: 9.2 10*3/uL (ref 4.0–10.5)

## 2010-12-04 LAB — TROPONIN I
Troponin I: 0.48 ng/mL
Troponin I: <0.3 ng/mL
Troponin I: <0.3 ng/mL

## 2010-12-04 LAB — POTASSIUM: Potassium: 3.2 mEq/L — ABNORMAL LOW (ref 3.5–5.1)

## 2010-12-04 LAB — POCT PREGNANCY, URINE: Preg Test, Ur: NEGATIVE

## 2010-12-04 LAB — HEPARIN LEVEL (UNFRACTIONATED): Heparin Unfractionated: <0.1 [IU]/mL — ABNORMAL LOW (ref 0.30–0.70)

## 2010-12-04 MED ORDER — IOHEXOL 300 MG/ML  SOLN
125.0000 mL | Freq: Once | INTRAMUSCULAR | Status: AC | PRN
  Administered 2010-12-04: 125 mL via INTRAVENOUS

## 2010-12-05 LAB — LIPID PANEL
Cholesterol: 153 mg/dL (ref 0–200)
HDL: 63 mg/dL (ref >39)
LDL Cholesterol: 41 mg/dL (ref 0–99)
Total CHOL/HDL Ratio: 2.4 RATIO
Triglycerides: 244 mg/dL — ABNORMAL HIGH (ref <150)
VLDL: 49 mg/dL — ABNORMAL HIGH (ref 0–40)

## 2010-12-05 LAB — CBC
Hemoglobin: 13.5 g/dL (ref 12.0–15.0)
MCH: 28.9 pg (ref 26.0–34.0)
MCHC: 31.3 g/dL (ref 30.0–36.0)
MCV: 92.3 fL (ref 78.0–100.0)
RBC: 4.67 MIL/uL (ref 3.87–5.11)

## 2010-12-05 LAB — BASIC METABOLIC PANEL
BUN: 16 mg/dL (ref 6–23)
CO2: 27 mEq/L (ref 19–32)
Calcium: 9.3 mg/dL (ref 8.4–10.5)
Chloride: 97 mEq/L (ref 96–112)
Creatinine, Ser: 1.18 mg/dL — ABNORMAL HIGH (ref 0.50–1.10)
Glucose, Bld: 226 mg/dL — ABNORMAL HIGH (ref 70–99)

## 2010-12-05 LAB — HEMOGLOBIN A1C: Hgb A1c MFr Bld: 11.4 % — ABNORMAL HIGH (ref <5.7)

## 2010-12-05 LAB — HEPARIN LEVEL (UNFRACTIONATED): Heparin Unfractionated: 0.1 IU/mL — ABNORMAL LOW (ref 0.30–0.70)

## 2010-12-10 NOTE — H&P (Signed)
NAME:  Debra Stokes, Debra Stokes NO.:  [id number]  MEDICAL RECORD NO.:  [id number]  LOCATION:                                 FACILITY:  PHYSICIAN:  Della Goo, M.D. DATE OF BIRTH:  10-30-1975  DATE OF ADMISSION:  12/04/2010 DATE OF DISCHARGE:                             HISTORY & PHYSICAL   DATE OF ADMISSION:  12/04/2010  PRIMARY CARE PHYSICIAN:  Lonia Blood, M.D.  CHIEF COMPLAINT:  Chest pain.  HISTORY OF PRESENT ILLNESS:  This is a 35 year old female with multiple medical problems which include congestive heart failure, diabetes, hypertension, morbid obesity, and asthma, who presents to the emergency department with complaints of chest pain.  She states the chest pain began at approximately 9 p.m.  She states the pain was substernal in location, radiating into her jaw and down her left arm.  She states that she took 3 sublingual nitroglycerin in succession without relief of the pain.  She reported having some shortness of breath associated with the pain and diaphoresis.  She denied having any nausea or any vomiting. The pain was described as a stabbing pain and she rated the pain as being a 10/10.  The pain lasted about 3 hours and was not relieved until she was administered Dilaudid in the emergency department.  PAST MEDICAL HISTORY:  Significant for congestive heart failure syndrome and she has undergone a cardiac catheterization in 2009 by Dr. Chilton Greathouse, cardiologist at [facility].  MEDICATIONS:  At this time include Lantus insulin, amlodipine, Cymbalta, furosemide, glipizide, hydrochlorothiazide, and metoprolol tartrate.  ALLERGIES:  No known drug allergies.  SOCIAL HISTORY:  The patient smokes 1/2 pack of cigarettes daily.  She denies any alcohol usage.  She denies any illicit drug usage.  FAMILY HISTORY:  Positive for coronary artery disease in her maternal grandmother who also had congestive heart failure, hypertension in her mother and  sister and a maternal aunt, positive diabetes in a maternal aunt, diabetes in her father as well, cancer in a maternal grandfather who had a brain tumor, and a maternal aunt who had breast cancer and she also had a paternal uncle that had an unknown type of cancer, unknown to her.  REVIEW OF SYSTEMS:  Pertinents mentioned above in the HPI.  All other organ systems are negative.  PHYSICAL EXAMINATION FINDINGS:  GENERAL:  This is a 35 year old morbidly obese African American female who is in no visible discomfort or acute distress currently. VITAL SIGNS:  Temperature 98.3; blood pressure 190/118 initially, now 149/87; heart rate 100, now 90; respirations initially 24, now 14; O2 saturations initially 96%, but now 98% to 100%. HEENT EXAMINATION:  Normocephalic, atraumatic.  Pupils equally round, reactive to light.  Extraocular movements are intact.  Funduscopic benign.  There is no scleral icterus.  Nares are patent bilaterally. Oropharynx is clear. NECK:  Supple, full range of motion.  No thyromegaly, adenopathy, or jugular venous distention. CHEST WALL:  Nontender to palpation.  No unusual masses.  Chest wall excursion is symmetric and breathing is unlabored. CARDIAC:  Regular rate and rhythm.  No murmurs, gallops, or rubs appreciated.  Normal S1, S2. LUNGS:  Clear to auscultation bilaterally without rales, rhonchi, or  wheezes. ABDOMEN:  Obese, positive bowel sounds, soft, nontender, nondistended. No hepatosplenomegaly. EXTREMITIES:  Without cyanosis, clubbing, or edema. NEUROLOGIC EXAMINATION:  Nonfocal.  LABORATORY STUDIES:  White blood cell count 9.2, hemoglobin 13.7, hematocrit 42.8, MCV 90.5, platelets 262, neutrophils 63%, lymphocytes 26%.  Sodium 133, potassium 5.6, chloride 94, carbon dioxide 28, BUN 22, creatinine 1.35, and glucose 241.  Cardiac enzymes with a total CK of 125, CK-MB 1.9, relative index 1.5, and troponin 0.48.  Repeat cardiac enzymes reveal a CK total of  77, CK-MB 1.7, and troponin less than 0.30. Repeat chemistry, potassium level was found to be 3.2, the previous specimen was hemolyzed.  Chest x-ray reveals cardiomegaly with central vascular congestion.  No overt edema or focal consolidation seen.  CT angiogram of the chest performed, negative for any evidence of pulmonary embolism.  Urine drug screen returns positive for cocaine.  EKG reveals a normal sinus rhythm.  No acute ST-segment changes are seen.  Left ventricular hypertrophic changes are seen.  ASSESSMENT:  35 year old female being admitted with: 1. Chest pain. 2. Uncontrolled hypertension. 3. Type 2 diabetes mellitus. 4. Cocaine use and abuse. 5. Morbid obesity. 6. History of asthma. 7. Tobacco abuse.  PLAN:  The patient will be admitted for 23-hour observation to telemetry area.  Cardiac enzymes will be performed.  The patient has been placed on a nitroglycerin drip, and IV heparin has also been ordered.  The patient's regular medications will be further reconciled and sliding scale insulin coverage will also be ordered. Further workup will ensue pending the results of the patient's clinical course and her studies.     Della Goo, M.D.     HJ/MEDQ  D:  12/04/2010  T:  12/05/2010  Job:  [id number]  cc:   Lonia Blood, M.D.  Electronically Signed by Della Goo M.D. on 12/10/2010 08:23:30 PM

## 2010-12-11 NOTE — Discharge Summary (Addendum)
NAME:  Debra Stokes, Debra Stokes NO.:  [id number]  MEDICAL RECORD NO.:  [id number]  LOCATION:  1406                         FACILITY:  [facility]  PHYSICIAN:  Rosanna Randy, MDDATE OF BIRTH:  15-Sep-1975  DATE OF ADMISSION:  12/03/2010 DATE OF DISCHARGE:  12/05/2010                              DISCHARGE SUMMARY   PRIMARY CARE PROVIDER:  Lonia Blood, M.D.  DISCHARGE DIAGNOSES: 1. Chest pain, noncardiac. 2. Chronic systolic heart failure, compensated. 3. Accelerated hypertension. 4. Diabetes type 2, uncontrolled. 5. Depression. 6. Obesity. 7. Asthma, chronic, without exacerbation. 8. Migraines.  DISCHARGE MEDICATIONS: 1. Protonix 40 mg p.o. b.i.d. 2. Topamax 25 mg p.o. q.h.s. for 14 days, then increase to 50 mg p.o.     q.h.s. 3. Albuterol inhaler 2 puffs every 4 hours as needed for shortness of     breath. 4. Albuterol nebulization one nebulization inhaled every 4 hours as     needed for shortness of breath. 5. Amlodipine 10 mg p.o. daily. 6. Cymbalta 60 mg p.o. daily. 7. Doxycycline 100 mg p.o. b.i.d. started on December 02, 2010, for 7     days. 8. Lasix 20 mg p.o. daily. 9. Glipizide 10 mg p.o. daily. 10.Hydrochlorothiazide 25 mg p.o. daily. 11.Lantus FlexPen 35 units subcu b.i.d. 12.Metoprolol 50 mg p.o. b.i.d..  DIAGNOSTIC LABS:  WBC 9.2, hemoglobin 13.7, hematocrit 42.8, platelets 262.  Sodium 133, potassium 3.2, chloride 94, CO2 of 28, BUN 22, creatinine 1.35, hemoglobin A1c 10.8.  First set of cardiac enzymes, total CK 125, CK-MB 1.9, troponin 0.48, however, this was determined to be hemolyzed, was repeated and results came back total CK 77, CK-MB 1.7, troponin I less than 0.30.  Second set yields a total CK 148, CK-MB 2.5, troponin I less than 0.30.  Third set yields a total CK 151, CK-MB 2.3, troponin I of less than 0.30.  BNP is 140.1.  Cholesterol is 153, triglycerides 244, HDL 63, LDL 41, VLDL 49.  Urine drug screen was positive for cocaine and  opiates.  PTT 37.  Urinalysis was negative. Urine pregnancy was negative.  Hemoglobin A1c is 10.8, magnesium 2.3, phosphorous 3.9.  DIAGNOSTIC IMAGING: 1. Chest x-ray done on December 04, 2010, yields cardiomegaly with central     vascular congestion.  Poor inspiratory effort results and     hypoaerated lungs with mild interstitial crowding.  No overt edema     or focal consolidation. 2. CT angio of the chest done on December 04, 2010, yields no pulmonary     embolism.  Cardiomegaly.  Bibasilar areas of air trapping.  CONSULTATIONS:  None.  PROCEDURES:  None.  BRIEF HISTORY:  Debra Stokes is a 35 year old female with multiple medical problems that include congestive heart failure, diabetes, hypertension, morbid obesity, and asthma who presented to the [facility] Emergency Department on December 04, 2010, with complaints of chest pain.  She reported that the pain had began at 9 p.m. and was located in her substernal area radiating to her jaw and down her left arm.  She indicated that she took 3 sublingual nitro in succession without relief. She reported having some shortness of breath associated with the pain as well as diaphoresis.  She denied any nausea or vomiting.  The pain was described as stabbing and she rated the pain as being 10/10.  She indicated that it lasted 3 hours and was not relieved until she was administered Dilaudid in the emergency room.  [facility] were asked to admit for further evaluation.  HOSPITAL COURSE BY PROBLEM: 1. Chest pain, noncardiac, probably secondary to GERD from recent     antibiotics.  The patient was admitted to telemetry, was started on     heparin drip.  She was monitored on telemetry with no EKG changes.     Cardiac enzymes were cycled x3 and came back negative.  The patient     remained chest pain free.  Was started on Protonix 40 mg p.o.     b.i.d.  Will need to follow up with Dr. Mikeal Hawthorne in 7 to 10 days. 2. Chronic systolic heart failure,  compensated, without exacerbation.     Probably secondary to uncontrolled hypertension.  The patient had a     2-D echo in January 2012, however, no ejection fraction is     documented.. 3. Accelerated hypertension.  At the time of this discharge dictation,     blood pressure is controlled. 4. Diabetes type 2, uncontrolled.  The patient's hemoglobin A1c is     0.6.  Will continue her Lantus 70 units subcu b.i.d. as well as her     glipizide.  Will follow up with Dr. Mikeal Hawthorne in 7 to 10 days, may need     to start meal coverage. 5. Depression, at baseline. 6. Obesity.  The patient's nutritional choices discussed, encouraged     to exercise.  Offered resources for weight loss. 7. Asthma, chronic, without exacerbation.  Continued her inhalers 8. Migraines.  Started the patient on Topamax 25 mg p.o. q.h.s. for 14     days, then increased to 50 mg p.o.  Follow up with Dr. Mikeal Hawthorne in 7     to 10 days.  ACTIVITY:  No restrictions.  DIET:  Low sodium.  FOLLOWUP:  The patient is to see her primary care provider Dr. Mikeal Hawthorne in 7 to 10 days.  Time spent on this discharge, 45 minutes.     Gwenyth Bender, NP   ______________________________ Rosanna Randy, MD    KMB/MEDQ  D:  12/05/2010  T:  12/05/2010  Job:  [id number]  cc:   Lonia Blood, M.D.  Electronically Signed by Toya Smothers  on 12/11/2010 07:35:46 AM Electronically Signed by Vassie Loll MD on 12/30/2010 05:42:19 PM

## 2011-01-01 ENCOUNTER — Emergency Department (HOSPITAL_COMMUNITY): Payer: Medicare Other

## 2011-01-01 ENCOUNTER — Emergency Department (HOSPITAL_COMMUNITY)
Admission: EM | Admit: 2011-01-01 | Discharge: 2011-01-01 | Disposition: A | Discharge status: Home / Self Care | Payer: Medicare Other | Attending: Emergency Medicine | Admitting: Emergency Medicine

## 2011-01-01 DIAGNOSIS — R0789 Other chest pain: Secondary | ICD-10-CM | POA: Insufficient documentation

## 2011-01-01 DIAGNOSIS — T148XXA Other injury of unspecified body region, initial encounter: Secondary | ICD-10-CM | POA: Insufficient documentation

## 2011-01-01 DIAGNOSIS — M25519 Pain in unspecified shoulder: Secondary | ICD-10-CM | POA: Insufficient documentation

## 2011-01-01 DIAGNOSIS — Z794 Long term (current) use of insulin: Secondary | ICD-10-CM | POA: Insufficient documentation

## 2011-01-01 DIAGNOSIS — G43909 Migraine, unspecified, not intractable, without status migrainosus: Secondary | ICD-10-CM | POA: Insufficient documentation

## 2011-01-01 DIAGNOSIS — I10 Essential (primary) hypertension: Secondary | ICD-10-CM | POA: Insufficient documentation

## 2011-01-01 DIAGNOSIS — E119 Type 2 diabetes mellitus without complications: Secondary | ICD-10-CM | POA: Insufficient documentation

## 2011-01-01 DIAGNOSIS — X58XXXA Exposure to other specified factors, initial encounter: Secondary | ICD-10-CM | POA: Insufficient documentation

## 2011-01-01 LAB — D-DIMER, QUANTITATIVE: D-Dimer, Quant: 0.33 ug/mL-FEU (ref 0.00–0.48)

## 2011-01-01 LAB — POCT I-STAT TROPONIN I: Troponin i, poc: 0.03 ng/mL (ref 0.00–0.08)

## 2011-01-01 LAB — POCT I-STAT, CHEM 8
BUN: 18 mg/dL (ref 6–23)
Chloride: 101 mEq/L (ref 96–112)
Creatinine, Ser: 1.2 mg/dL — ABNORMAL HIGH (ref 0.50–1.10)
Potassium: 3.7 mEq/L (ref 3.5–5.1)
Sodium: 137 mEq/L (ref 135–145)
TCO2: 26 mmol/L (ref 0–100)

## 2011-01-01 LAB — GLUCOSE, CAPILLARY: Glucose-Capillary: 184 mg/dL — ABNORMAL HIGH (ref 70–99)

## 2011-01-12 ENCOUNTER — Emergency Department (HOSPITAL_COMMUNITY)
Admission: EM | Admit: 2011-01-12 | Discharge: 2011-01-12 | Disposition: A | Discharge status: Home / Self Care | Payer: Medicare Other | Attending: Emergency Medicine | Admitting: Emergency Medicine

## 2011-01-12 DIAGNOSIS — T148XXA Other injury of unspecified body region, initial encounter: Secondary | ICD-10-CM | POA: Insufficient documentation

## 2011-01-12 DIAGNOSIS — I10 Essential (primary) hypertension: Secondary | ICD-10-CM | POA: Insufficient documentation

## 2011-01-12 DIAGNOSIS — X503XXA Overexertion from repetitive movements, initial encounter: Secondary | ICD-10-CM | POA: Insufficient documentation

## 2011-01-12 DIAGNOSIS — R0789 Other chest pain: Secondary | ICD-10-CM | POA: Insufficient documentation

## 2011-01-12 DIAGNOSIS — E119 Type 2 diabetes mellitus without complications: Secondary | ICD-10-CM | POA: Insufficient documentation

## 2011-01-12 DIAGNOSIS — Z794 Long term (current) use of insulin: Secondary | ICD-10-CM | POA: Insufficient documentation

## 2011-01-12 DIAGNOSIS — I509 Heart failure, unspecified: Secondary | ICD-10-CM | POA: Insufficient documentation

## 2011-01-12 DIAGNOSIS — N289 Disorder of kidney and ureter, unspecified: Secondary | ICD-10-CM | POA: Insufficient documentation

## 2011-01-12 DIAGNOSIS — M5430 Sciatica, unspecified side: Secondary | ICD-10-CM | POA: Insufficient documentation

## 2011-01-12 LAB — CBC
MCHC: 32 g/dL (ref 30.0–36.0)
Platelets: 305 10*3/uL (ref 150–400)
RDW: 15.7 % — ABNORMAL HIGH (ref 11.5–15.5)
WBC: 6.8 10*3/uL (ref 4.0–10.5)

## 2011-01-12 LAB — BASIC METABOLIC PANEL
BUN: 18 mg/dL (ref 6–23)
Calcium: 9.2 mg/dL (ref 8.4–10.5)
Chloride: 102 mEq/L (ref 96–112)
Creatinine, Ser: 1.23 mg/dL — ABNORMAL HIGH (ref 0.50–1.10)
GFR calc Af Amer: >60 mL/min (ref >60)

## 2011-01-12 LAB — POCT I-STAT TROPONIN I: Troponin i, poc: 0.01 ng/mL (ref 0.00–0.08)

## 2011-01-12 LAB — DIFFERENTIAL
Basophils Absolute: 0 10*3/uL (ref 0.0–0.1)
Lymphocytes Relative: 28 % (ref 12–46)
Lymphs Abs: 1.9 10*3/uL (ref 0.7–4.0)
Monocytes Absolute: 0.6 10*3/uL (ref 0.1–1.0)
Neutro Abs: 4.2 10*3/uL (ref 1.7–7.7)

## 2011-01-12 LAB — GLUCOSE, CAPILLARY

## 2011-01-22 ENCOUNTER — Emergency Department (HOSPITAL_COMMUNITY)
Admission: EM | Admit: 2011-01-22 | Discharge: 2011-01-22 | Disposition: A | Discharge status: Home / Self Care | Payer: Medicare Other | Attending: Emergency Medicine | Admitting: Emergency Medicine

## 2011-01-22 DIAGNOSIS — M79609 Pain in unspecified limb: Secondary | ICD-10-CM | POA: Insufficient documentation

## 2011-01-22 DIAGNOSIS — F3289 Other specified depressive episodes: Secondary | ICD-10-CM | POA: Insufficient documentation

## 2011-01-22 DIAGNOSIS — I10 Essential (primary) hypertension: Secondary | ICD-10-CM | POA: Insufficient documentation

## 2011-01-22 DIAGNOSIS — I509 Heart failure, unspecified: Secondary | ICD-10-CM | POA: Insufficient documentation

## 2011-01-22 DIAGNOSIS — F329 Major depressive disorder, single episode, unspecified: Secondary | ICD-10-CM | POA: Insufficient documentation

## 2011-01-22 DIAGNOSIS — Z794 Long term (current) use of insulin: Secondary | ICD-10-CM | POA: Insufficient documentation

## 2011-01-22 DIAGNOSIS — M129 Arthropathy, unspecified: Secondary | ICD-10-CM | POA: Insufficient documentation

## 2011-01-22 DIAGNOSIS — E119 Type 2 diabetes mellitus without complications: Secondary | ICD-10-CM | POA: Insufficient documentation

## 2011-02-02 LAB — DIFFERENTIAL
Basophils Relative: 0
Eosinophils Absolute: 0.1
Eosinophils Relative: 1
Monocytes Absolute: 0.1
Monocytes Relative: 1 — ABNORMAL LOW
Neutrophils Relative %: 75

## 2011-02-02 LAB — COMPREHENSIVE METABOLIC PANEL
ALT: 37 — ABNORMAL HIGH
AST: 16
Alkaline Phosphatase: 44
CO2: 28
GFR calc Af Amer: 54 — ABNORMAL LOW
Glucose, Bld: 201 — ABNORMAL HIGH
Potassium: 3.9
Sodium: 136
Total Protein: 6.2

## 2011-02-02 LAB — CBC
Hemoglobin: 13
RBC: 4.32
RDW: 14.8

## 2011-02-08 ENCOUNTER — Emergency Department (HOSPITAL_COMMUNITY)
Admission: EM | Admit: 2011-02-08 | Discharge: 2011-02-09 | Disposition: A | Discharge status: Home / Self Care | Payer: Medicare Other | Attending: Emergency Medicine | Admitting: Emergency Medicine

## 2011-02-08 ENCOUNTER — Emergency Department (HOSPITAL_COMMUNITY): Payer: Medicare Other

## 2011-02-08 DIAGNOSIS — I509 Heart failure, unspecified: Secondary | ICD-10-CM | POA: Insufficient documentation

## 2011-02-08 DIAGNOSIS — I10 Essential (primary) hypertension: Secondary | ICD-10-CM | POA: Insufficient documentation

## 2011-02-08 DIAGNOSIS — R059 Cough, unspecified: Secondary | ICD-10-CM | POA: Insufficient documentation

## 2011-02-08 DIAGNOSIS — R079 Chest pain, unspecified: Secondary | ICD-10-CM | POA: Insufficient documentation

## 2011-02-08 DIAGNOSIS — E119 Type 2 diabetes mellitus without complications: Secondary | ICD-10-CM | POA: Insufficient documentation

## 2011-02-08 DIAGNOSIS — Z794 Long term (current) use of insulin: Secondary | ICD-10-CM | POA: Insufficient documentation

## 2011-02-08 DIAGNOSIS — M25519 Pain in unspecified shoulder: Secondary | ICD-10-CM | POA: Insufficient documentation

## 2011-02-08 DIAGNOSIS — M549 Dorsalgia, unspecified: Secondary | ICD-10-CM | POA: Insufficient documentation

## 2011-02-08 DIAGNOSIS — R05 Cough: Secondary | ICD-10-CM | POA: Insufficient documentation

## 2011-02-08 LAB — CBC
MCH: 30.4 pg (ref 26.0–34.0)
MCHC: 32.7 g/dL (ref 30.0–36.0)
MCV: 92.9 fL (ref 78.0–100.0)
Platelets: 293 10*3/uL (ref 150–400)
RDW: 14.7 % (ref 11.5–15.5)

## 2011-02-08 LAB — DIFFERENTIAL
Basophils Relative: 0 % (ref 0–1)
Eosinophils Absolute: 0.1 10*3/uL (ref 0.0–0.7)
Eosinophils Relative: 1 % (ref 0–5)
Lymphs Abs: 2.3 10*3/uL (ref 0.7–4.0)
Monocytes Absolute: 0.6 10*3/uL (ref 0.1–1.0)
Monocytes Relative: 8 % (ref 3–12)
Neutrophils Relative %: 64 % (ref 43–77)

## 2011-02-08 LAB — BASIC METABOLIC PANEL
BUN: 16 mg/dL (ref 6–23)
Creatinine, Ser: 1.35 mg/dL — ABNORMAL HIGH (ref 0.50–1.10)
GFR calc Af Amer: 54 mL/min — ABNORMAL LOW (ref >60)
GFR calc non Af Amer: 45 mL/min — ABNORMAL LOW (ref >60)
Glucose, Bld: 258 mg/dL — ABNORMAL HIGH (ref 70–99)

## 2011-02-08 LAB — POCT I-STAT TROPONIN I: Troponin i, poc: 0.02 ng/mL (ref 0.00–0.08)

## 2011-02-19 ENCOUNTER — Emergency Department (HOSPITAL_COMMUNITY)
Admission: EM | Admit: 2011-02-19 | Discharge: 2011-02-19 | Disposition: A | Discharge status: Home / Self Care | Payer: Medicare Other | Attending: Emergency Medicine | Admitting: Emergency Medicine

## 2011-02-19 ENCOUNTER — Emergency Department (HOSPITAL_COMMUNITY): Payer: Medicare Other

## 2011-02-19 DIAGNOSIS — F411 Generalized anxiety disorder: Secondary | ICD-10-CM | POA: Insufficient documentation

## 2011-02-19 DIAGNOSIS — R071 Chest pain on breathing: Secondary | ICD-10-CM | POA: Insufficient documentation

## 2011-02-19 DIAGNOSIS — I10 Essential (primary) hypertension: Secondary | ICD-10-CM | POA: Insufficient documentation

## 2011-02-19 DIAGNOSIS — F17200 Nicotine dependence, unspecified, uncomplicated: Secondary | ICD-10-CM | POA: Insufficient documentation

## 2011-02-19 DIAGNOSIS — E119 Type 2 diabetes mellitus without complications: Secondary | ICD-10-CM | POA: Insufficient documentation

## 2011-02-19 DIAGNOSIS — M25519 Pain in unspecified shoulder: Secondary | ICD-10-CM | POA: Insufficient documentation

## 2011-02-19 DIAGNOSIS — R079 Chest pain, unspecified: Secondary | ICD-10-CM | POA: Insufficient documentation

## 2011-02-19 LAB — POCT I-STAT, CHEM 8
Calcium, Ion: 1.17 mmol/L (ref 1.12–1.32)
Creatinine, Ser: 1.4 mg/dL — ABNORMAL HIGH (ref 0.50–1.10)
Glucose, Bld: 266 mg/dL — ABNORMAL HIGH (ref 70–99)
HCT: 44 % (ref 36.0–46.0)
Hemoglobin: 15 g/dL (ref 12.0–15.0)
Potassium: 3.9 mEq/L (ref 3.5–5.1)

## 2011-03-07 ENCOUNTER — Observation Stay (HOSPITAL_COMMUNITY)
Admission: EM | Admit: 2011-03-07 | Discharge: 2011-03-08 | Disposition: A | Discharge status: Home / Self Care | Payer: Medicare Other | Source: Ambulatory Visit | Attending: Internal Medicine | Admitting: Internal Medicine

## 2011-03-07 ENCOUNTER — Emergency Department (HOSPITAL_COMMUNITY): Payer: Medicare Other

## 2011-03-07 DIAGNOSIS — Z794 Long term (current) use of insulin: Secondary | ICD-10-CM | POA: Insufficient documentation

## 2011-03-07 DIAGNOSIS — I10 Essential (primary) hypertension: Secondary | ICD-10-CM | POA: Insufficient documentation

## 2011-03-07 DIAGNOSIS — R0789 Other chest pain: Secondary | ICD-10-CM | POA: Insufficient documentation

## 2011-03-07 DIAGNOSIS — F411 Generalized anxiety disorder: Secondary | ICD-10-CM | POA: Insufficient documentation

## 2011-03-07 DIAGNOSIS — E119 Type 2 diabetes mellitus without complications: Secondary | ICD-10-CM | POA: Insufficient documentation

## 2011-03-07 DIAGNOSIS — Z6841 Body Mass Index (BMI) 40.0 and over, adult: Secondary | ICD-10-CM | POA: Insufficient documentation

## 2011-03-07 DIAGNOSIS — J440 Chronic obstructive pulmonary disease with acute lower respiratory infection: Secondary | ICD-10-CM | POA: Insufficient documentation

## 2011-03-07 DIAGNOSIS — J441 Chronic obstructive pulmonary disease with (acute) exacerbation: Principal | ICD-10-CM | POA: Insufficient documentation

## 2011-03-07 DIAGNOSIS — F3289 Other specified depressive episodes: Secondary | ICD-10-CM | POA: Insufficient documentation

## 2011-03-07 DIAGNOSIS — J209 Acute bronchitis, unspecified: Secondary | ICD-10-CM | POA: Insufficient documentation

## 2011-03-07 DIAGNOSIS — F329 Major depressive disorder, single episode, unspecified: Secondary | ICD-10-CM | POA: Insufficient documentation

## 2011-03-07 DIAGNOSIS — R51 Headache: Secondary | ICD-10-CM | POA: Insufficient documentation

## 2011-03-07 HISTORY — DX: Heart failure, unspecified: I50.9

## 2011-03-07 LAB — CBC
HCT: 34.4 % — ABNORMAL LOW (ref 36.0–46.0)
Hemoglobin: 11.3 g/dL — ABNORMAL LOW (ref 12.0–15.0)
MCH: 30.5 pg (ref 26.0–34.0)
MCHC: 32.8 g/dL (ref 30.0–36.0)
MCV: 92.7 fL (ref 78.0–100.0)
Platelets: 233 K/uL (ref 150–400)
RBC: 3.71 MIL/uL — ABNORMAL LOW (ref 3.87–5.11)
RDW: 14.5 % (ref 11.5–15.5)
WBC: 7.7 K/uL (ref 4.0–10.5)

## 2011-03-07 LAB — DIFFERENTIAL
Basophils Absolute: 0 K/uL (ref 0.0–0.1)
Basophils Relative: 0 % (ref 0–1)
Eosinophils Absolute: 0.1 K/uL (ref 0.0–0.7)
Eosinophils Relative: 1 % (ref 0–5)
Lymphocytes Relative: 19 % (ref 12–46)
Lymphs Abs: 1.4 K/uL (ref 0.7–4.0)
Monocytes Absolute: 0.6 K/uL (ref 0.1–1.0)
Monocytes Relative: 7 % (ref 3–12)
Neutro Abs: 5.6 K/uL (ref 1.7–7.7)
Neutrophils Relative %: 73 % (ref 43–77)

## 2011-03-07 LAB — PROTIME-INR
INR: 0.93 (ref 0.00–1.49)
Prothrombin Time: 12.7 s (ref 11.6–15.2)

## 2011-03-07 LAB — BASIC METABOLIC PANEL WITH GFR
BUN: 18 mg/dL (ref 6–23)
CO2: 27 meq/L (ref 19–32)
Calcium: 9.5 mg/dL (ref 8.4–10.5)
Chloride: 100 meq/L (ref 96–112)
Creatinine, Ser: 1.04 mg/dL (ref 0.50–1.10)
GFR calc Af Amer: 80 mL/min — ABNORMAL LOW
GFR calc non Af Amer: 69 mL/min — ABNORMAL LOW
Glucose, Bld: 232 mg/dL — ABNORMAL HIGH (ref 70–99)
Potassium: 3.8 meq/L (ref 3.5–5.1)
Sodium: 138 meq/L (ref 135–145)

## 2011-03-07 LAB — APTT: aPTT: 33 seconds (ref 24–37)

## 2011-03-08 ENCOUNTER — Inpatient Hospital Stay (HOSPITAL_COMMUNITY): Payer: Medicare Other

## 2011-03-08 ENCOUNTER — Encounter (HOSPITAL_COMMUNITY): Payer: Self-pay | Admitting: Radiology

## 2011-03-08 LAB — URINALYSIS, MICROSCOPIC ONLY
Bilirubin Urine: NEGATIVE
Hgb urine dipstick: NEGATIVE
Ketones, ur: NEGATIVE mg/dL
Protein, ur: 30 mg/dL — AB
Urobilinogen, UA: 1 mg/dL (ref 0.0–1.0)

## 2011-03-08 LAB — RAPID URINE DRUG SCREEN, HOSP PERFORMED
Barbiturates: NOT DETECTED
Benzodiazepines: NOT DETECTED
Cocaine: POSITIVE — AB
Opiates: NOT DETECTED
Tetrahydrocannabinol: NOT DETECTED

## 2011-03-08 LAB — POCT I-STAT TROPONIN I: Troponin i, poc: 0.04 ng/mL (ref 0.00–0.08)

## 2011-03-08 MED ORDER — IOHEXOL 350 MG/ML SOLN
100.0000 mL | Freq: Once | INTRAVENOUS | Status: AC | PRN
  Administered 2011-03-08: 100 mL via INTRAVENOUS

## 2011-03-09 LAB — GLUCOSE, CAPILLARY: Glucose-Capillary: 296 mg/dL — ABNORMAL HIGH (ref 70–99)

## 2011-03-13 NOTE — Discharge Summary (Signed)
NAME:  Debra Stokes, Debra Stokes NO.:  [id number]  MEDICAL RECORD NO.:  [id number]  LOCATION:  2012                         FACILITY:  [facility]  PHYSICIAN:  Altha Harm, MDDATE OF BIRTH:  April 14, 1976  DATE OF ADMISSION:  03/07/2011 DATE OF DISCHARGE:  03/08/2011                              DISCHARGE SUMMARY   DISCHARGE DISPOSITION:  Home.  FINAL DISCHARGE DIAGNOSES: 1. Acute bronchitis. 2. Headache. 3. Malignant hypertension. 4. Diabetes type 2 insulin dependent. 5. BMI greater than 40. 6. Suspect strongly obstructive sleep apnea. 7. Anxiety depression. 8. Headache.  DISCHARGE MEDICATIONS: 1. Azithromycin 250 mg p.o. daily x4 days. 2. Fioricet 1 tab p.o. q.6 h. p.r.n. headache. 3. Prednisone 40 mg p.o. daily x4 days. 4. Albuterol inhaler 2 puffs inhaled q.4 h. p.r.n. shortness of     breath. 5. Albuterol nebulized q.4 h. daily as needed for shortness of breath. 6. Norvasc 10 mg p.o. daily. 7. Cymbalta 60 mg p.o. daily. 8. Glipizide 10 mg p.o. b.i.d. 9. Hydrochlorothiazide 25 mg p.o. daily. 10.Lantus 35 units subcutaneously b.i.d. 11.Lisinopril 40 mg p.o. daily. 12.Lopressor 100 mg p.o. daily. 13.Lorazepam 2 mg p.o. at bedtime p.r.n. anxiety. 14.Metoprolol tartrate 50 mg p.o. b.i.d. 15.Lasix 40 mg p.o. daily.  CONSULTANTS:  None.  PROCEDURES:  None.  DIAGNOSTIC STUDIES: 1. Chest x-ray on admission which shows lungs mildly hyperextended.     Vascular congestion and cardiomegaly with increased interstitial     marking, raising question for mild interstitial edema. 2. CT angiogram which shows poor to moderate quality evaluation for     pulmonary embolism.  However, no large central embolism identified. 3. Pulmonary artery enlargement suggests pulmonary arterial     hypertension. 4. Development of middle and anterior mediastinal adenopathy     nonspecific probably reactive.  Alternate explanation including a     component of low-grade congestive  heart failure should be also     considered.  Follow up in 3-6 months to confirm stability of     resolution. 5. Mosaic attenuation within the lung favored to represent air     trapping.  PRIMARY CARE PHYSICIAN:  Lonia Blood, MD  ALLERGIES:  No known drug allergies.  CODE STATUS:  Full code.  CHIEF COMPLAINT:  Shortness of breath.  HISTORY OF PRESENT ILLNESS:  Please refer to the H and P by Dr. Eldridge Dace for details of the HPI here.  However, in short, this is a 35 year old female who is morbidly obese.  The patient has definitely suspected obstructive sleep apnea.  However, the patient stated that she had started having some pain in the left intercostal area.  She stated that she was having significant coughing and the pain got worse with the cough.  She presented to the emergency room.  She was concerned that this was related to her heart.  She states that these symptoms have been occurring over several days.  HOSPITAL COURSE:  The patient was admitted by Dr. Onalee Hua.  She felt that the complains of this patient were purely respiratory in nature.  A set of cardiac enzymes were done and were found to be negative.  However, that was not high on suspicion.  She  was, however, concerned about the possibility of a pulmonary embolus and a CT angiogram was done which did not show any central or large pulmonary emboli.  The patient was started on steroids and given albuterol.  She improved considerably.  The patient has had no requirement for oxygen at rest and during ambulation. However, she does have episodes of hypoxia at night requiring oxygen during sleep.  The patient looks nontoxic.  I suspect that this patient has acute bronchitis which I am treating her and she has improved her condition.  She also has a very strong suspicion of obstructive sleep apnea.  As noted on the CT, the patient has findings consistent with pulmonary hypertension, which is likely related to her obesity  and obstructive sleep apnea.  At this time, I recommended the patient should have some oxygen overnight for sleep.  I am giving her a prescription for split light study for polysomnogram and a CPAP titration and she is to follow up with Dr. Lonia Blood.  No other medication adjustments were made for her chronic medications.  At the time of discharge, the patient is stable.  The patient complained of headache. She was given Fioricet which had good results for the patient. She is being discharged home with a prescription for Fioricet.  The patient was given azithromycin and she is to complete 4 days of azithromycin for sterilization of the airways.  PHYSICAL EXAMINATION:  VITAL SIGNS:  Temperature is 98.8, heart rate ranging between 97 and 92 depending on patient's activity, her O2 sats are 96% on room air, 95% with activity, blood pressure is 157/98 and respiratory rate is 18. GENERAL:  The patient is nontoxic appearing. HEENT:  She is normocephalic, atraumatic.  Pupils equally round, reactive to light and accommodation.  Extraocular movements are intact. Oropharynx is moist.  No exudate, erythema or lesions are noted. NECK:  Trachea is midline.  No masses.  No thyromegaly.  No JVD.  No carotid bruit. RESPIRATORY:  The patient has a normal respiratory effort.  Equal excursion bilaterally.  No wheezing or rhonchi are noted at this time. CARDIOVASCULAR:  She has got a normal S1 and S2.  She has normal sinus rhythm on telemetry.  She has sinus tachycardia with ambulation. There are no murmurs, rubs or gallops noted.  PMI is nondisplaced.  No heaves, rubs or thrills on palpation. ABDOMEN:  Obese, nontender, nondistended and no masses.  No hepatosplenomegaly. EXTREMITIES:  No clubbing, cyanosis or edema.  DIETARY RESTRICTIONS:  The patient should be on a heart-healthy diabetic diet.  PHYSICAL RESTRICTIONS:  Activity as tolerated.  FOLLOWUP:  The patient to follow up with Dr. Lonia Blood within 3-5 days and she has been given a prescription for outpatient sleep study.  She is to call and arrange for the sleep study.  In the meantime, I am going to ask case management to arrange for home oxygen 2 L for the patient to wear during the night.  Total Time for Discharge process including Face to Face time Approximately 50 minutes.     Altha Harm, MD     MAM/MEDQ  D:  03/08/2011  T:  03/09/2011  Job:  [id number]  cc:   Lonia Blood, M.D.  Electronically Signed by Marthann Schiller MD on 03/13/2011 01:23:12 PM

## 2011-03-14 NOTE — H&P (Signed)
NAME:  Debra Stokes, Debra Stokes NO.:  [id number]  MEDICAL RECORD NO.:  [id number]  LOCATION:  [facility]                         FACILITY:  [facility]  PHYSICIAN:  Tarry Kos, MD       DATE OF BIRTH:  1975/11/05  DATE OF ADMISSION:  03/07/2011 DATE OF DISCHARGE:                             HISTORY & PHYSICAL   CHIEF COMPLAINT:  Shortness of breath.  HISTORY OF PRESENT ILLNESS:  Debra Stokes is a 35 year old female with a history of COPD, morbid obesity, probably undiagnosed obstructive sleep apnea who has not gotten a sleep study yet, history of heart failure, asthma who presents to the ED because of several days of worsening wheezing and shortness of breath.  She does smoke a half pack of cigarettes a day.  Upon arrival in the emergency department, she had diffuse wheezing.  She was given several nebulizer treatments and she has had improvement since then.  She is complaining of some pleuritic chest pain with deep breathing.  For that reason, a D-dimer was checked which was mildly elevated.  Subsequent CAT scan of her chest was done to rule out PE which was a very poor quality due to her obesity.  We are being asked to admit the patient for further workup of that inadequate CAT scan to rule out for PE.  She has chronic lower extremity edema. She does have some pleuritic chest pain.  She denies any recent fevers, nausea, vomiting or diarrhea.  She has been having more cough than normal.  She had a chronic chest pain actually in the past.  She says this is different.  This is more a pain underneath her left breast when she takes a deep breath.  She denies any rashes underneath her left breast.  PAST MEDICAL HISTORY: 1. History of congestive heart failure with cardiac cath in 2009. 2. Insulin-dependent diabetes. 3. Hypertension. 4. COPD and asthma. 5. Morbid obesity.  She has been instructed to do an outpatient sleep study but has not been compliant with getting that done  as she probably has undiagnosed sleep apnea.  ALLERGIES:  None.  SOCIAL HISTORY:  She is a smoker.  Denies alcohol.  Denies any other drugs.  However she had a positive drug screen here for cocaine in the last couple of months.  REVIEW OF SYSTEMS:  Otherwise negative.  MEDICATIONS:  Please see MAR.  PHYSICAL EXAMINATION:  VITAL SIGNS:  Temperature is 98.5, blood pressure 169/94, pulse 96, respirations initially 28, 100% on room air. GENERAL:  She is alert and oriented, no apparent distress, cooperative and friendly, morbidly obese. HEENT:  Extraocular muscles are intact.  Pupils are equal and reactive to light.  Oropharynx is clear.  Mucous membranes are moist. NECK:  No JVD, no carotid bruit. CARDIAC:  Regular rate and rhythm without murmurs, rubs, or gallops. CHEST:  Clear to auscultation bilaterally.  No wheezing, rhonchi, or rales. ABDOMEN:  Soft, nontender, nondistended.  Positive bowel sounds, no hepatosplenomegaly. EXTREMITIES:  No clubbing, cyanosis, or edema. PSYCHIATRIC:  Normal affect. NEUROLOGIC:  No focal neurologic deficits. SKIN:  No rashes.  CT of her chest again was indeterminate for ruling out pulmonary emboli due to inadequate study.  Chest x-ray is a very poor quality.  Pregnancy test is negative.  Cardiac enzymes are negative.  Electrolytes are normal.  BUN and creatinine are normal.  D-dimer 1.15.  White count 7.7, hemoglobin 11.3.  A 12-lead EKG is not on the chart and was reported to me as negative.  ASSESSMENT AND PLAN:  This is a 35 year old female with acute chronic obstructive pulmonary disease exacerbation with atypical chest pain. 1. Acute chronic obstructive pulmonary disease exacerbation.  We will     place the patient on oral prednisone on frequent nebulizer     treatments. 2. Atypical chest pain.  We will obtain a V/Q scan in the morning to     better ascertain really if she has a pulmonary emboli.  However, I     doubt this due to her O2  sat being normal.  However due to the     pleuritic nature of her chest pain, this is of major concern for     the ED.  We will continue to rule out serial cardiac enzymes, and     continue telemetry monitoring. 3. Morbid obesity stable. 4. Diabetes stable. 5. Probable obstructive sleep apnea.  I have explained to her the     importance of her getting a sleep study done.  She understands this     is probably the cause of her heart failure and exacerbating her     other medical issues.  She understands this. 6. History of polysubstance abuse.  I am going to add on a urine drug     screen to see if she has got any cocaine in her drug screen.  I did     not push the issue because she has got family members in her room     right now. 7. We will admit the patient on telemetry.  Further recommendation     pending over hospital course.          ______________________________ Tarry Kos, MD     RD/MEDQ  D:  03/08/2011  T:  03/08/2011  Job:  [id number]  Electronically Signed by Tarry Kos MD on 03/14/2011 08:20:00 PM

## 2011-04-11 ENCOUNTER — Encounter (HOSPITAL_COMMUNITY): Payer: Self-pay | Admitting: *Deleted

## 2011-04-11 ENCOUNTER — Emergency Department (HOSPITAL_COMMUNITY)
Admission: EM | Admit: 2011-04-11 | Discharge: 2011-04-11 | Disposition: A | Discharge status: Home / Self Care | Payer: Medicare Other | Attending: Emergency Medicine | Admitting: Emergency Medicine

## 2011-04-11 DIAGNOSIS — M5430 Sciatica, unspecified side: Secondary | ICD-10-CM | POA: Insufficient documentation

## 2011-04-11 DIAGNOSIS — Z794 Long term (current) use of insulin: Secondary | ICD-10-CM | POA: Insufficient documentation

## 2011-04-11 DIAGNOSIS — E119 Type 2 diabetes mellitus without complications: Secondary | ICD-10-CM | POA: Insufficient documentation

## 2011-04-11 DIAGNOSIS — M545 Low back pain, unspecified: Secondary | ICD-10-CM | POA: Insufficient documentation

## 2011-04-11 DIAGNOSIS — M79609 Pain in unspecified limb: Secondary | ICD-10-CM | POA: Insufficient documentation

## 2011-04-11 DIAGNOSIS — M549 Dorsalgia, unspecified: Secondary | ICD-10-CM

## 2011-04-11 DIAGNOSIS — R209 Unspecified disturbances of skin sensation: Secondary | ICD-10-CM | POA: Insufficient documentation

## 2011-04-11 LAB — URINALYSIS, ROUTINE W REFLEX MICROSCOPIC
Glucose, UA: >1000 mg/dL — AB
Leukocytes, UA: NEGATIVE
Nitrite: NEGATIVE
Protein, ur: NEGATIVE mg/dL
pH: 6 (ref 5.0–8.0)

## 2011-04-11 LAB — POCT PREGNANCY, URINE: Preg Test, Ur: NEGATIVE

## 2011-04-11 MED ORDER — OXYCODONE-ACETAMINOPHEN 5-325 MG PO TABS: 2.0000 | ORAL_TABLET | ORAL | Status: AC | PRN

## 2011-04-11 MED ORDER — HYDROMORPHONE HCL PF 2 MG/ML IJ SOLN
2.0000 mg | Freq: Once | INTRAMUSCULAR | Status: AC
  Administered 2011-04-11: 2 mg via INTRAMUSCULAR
  Filled 2011-04-11: qty 1

## 2011-04-11 MED ORDER — DEXAMETHASONE SODIUM PHOSPHATE 10 MG/ML IJ SOLN
10.0000 mg | Freq: Once | INTRAMUSCULAR | Status: AC
  Administered 2011-04-11: 10 mg via INTRAMUSCULAR
  Filled 2011-04-11: qty 1

## 2011-04-11 MED ORDER — CYCLOBENZAPRINE HCL 10 MG PO TABS: 10.0000 mg | ORAL_TABLET | Freq: Three times a day (TID) | ORAL | Status: AC | PRN

## 2011-04-11 NOTE — ED Provider Notes (Signed)
History     CSN: [id number] Arrival date & time: 04/11/2011  4:27 PM   First MD Initiated Contact with Patient 04/11/11 1738      Chief Complaint  Patient presents with  . Back Pain    pt c/o lower back pain and thigh pain. pt states that approximately 2 hrs ago "I got off the bed wrong, and I have sciatica but the pain is so bad that I feel pins and needles in my thigh and across my back."     (Consider location/radiation/quality/duration/timing/severity/associated sxs/prior treatment) Patient is a 35 y.o. female presenting with back pain. The history is provided by the patient. No language interpreter was used.  Back Pain  This is a chronic problem. The pain is associated with no known injury. The quality of the pain is described as aching, shooting and burning. The pain radiates to the right thigh. The pain is at a severity of 8/10. The pain is moderate. The symptoms are aggravated by bending, twisting and certain positions. Associated symptoms include tingling. Pertinent negatives include no fever, no numbness, no bowel incontinence, no perianal numbness, no bladder incontinence, no pelvic pain, no leg pain, no paresthesias, no paresis and no weakness. She has tried NSAIDs for the symptoms. Risk factors include obesity and a sedentary lifestyle.   C/o R lower back pain that radiates to RLE since she awoke this am.  This pain has been chronic x 2 years but worse this am .   Past Medical History  Diagnosis Date  . Diabetes mellitus   . Asthma   . CHF (congestive heart failure)     No past surgical history on file.  No family history on file.  History  Substance Use Topics  . Smoking status: Current Everyday Smoker    Types: Cigarettes  . Smokeless tobacco: Not on file  . Alcohol Use: No    OB History    Grav Para Term Preterm Abortions TAB SAB Ect Mult Living                  Review of Systems  Constitutional: Negative for fever.  Gastrointestinal: Negative for bowel  incontinence.  Genitourinary: Negative for bladder incontinence and pelvic pain.  Musculoskeletal: Positive for back pain.  Neurological: Positive for tingling. Negative for weakness, numbness and paresthesias.  All other systems reviewed and are negative.    Allergies  Review of patient's allergies indicates no known allergies.  Home Medications   Current Outpatient Rx  Name Route Sig Dispense Refill  . AMLODIPINE BESYLATE 10 MG PO TABS Oral Take 10 mg by mouth daily.      . DULOXETINE HCL 60 MG PO CPEP Oral Take 60 mg by mouth daily.      . FUROSEMIDE 20 MG PO TABS Oral Take 20 mg by mouth daily.      Marland Kitchen GLIPIZIDE 10 MG PO TABS Oral Take 10 mg by mouth 2 (two) times daily before a meal.      . HYDROCHLOROTHIAZIDE 25 MG PO TABS Oral Take 25 mg by mouth daily.      . INSULIN GLARGINE 100 UNIT/ML [place] SOLN Subcutaneous Inject 35 Units into the skin 2 (two) times daily.      Marland Kitchen LISINOPRIL 40 MG PO TABS Oral Take 40 mg by mouth daily.      Marland Kitchen LORAZEPAM 2 MG PO TABS Oral Take 2 mg by mouth every 6 (six) hours as needed. For anxiety.     Marland Kitchen METOPROLOL TARTRATE  100 MG PO TABS Oral Take 100 mg by mouth every morning.      Marland Kitchen METOPROLOL TARTRATE 50 MG PO TABS Oral Take 50 mg by mouth 2 (two) times daily.        BP 179/125  Pulse 94  Temp(Src) 98.5 F (36.9 C) (Oral)  Resp 22  Wt 379 lb (171.913 kg)  SpO2 97%  LMP 03/20/2011  Physical Exam  Constitutional: She is oriented to person, place, and time. She appears well-developed and well-nourished.       Obese   Neck: Normal range of motion.  Pulmonary/Chest: Effort normal.  Abdominal: Soft.  Musculoskeletal: She exhibits tenderness. She exhibits no edema.       R lower back pain and RLE pain  Neurological: She is oriented to person, place, and time.  Skin: Skin is warm and dry.  Psychiatric: She has a normal mood and affect.    ED Course  Procedures (including critical care time)  Labs Reviewed  URINALYSIS, ROUTINE W REFLEX  MICROSCOPIC - Abnormal; Notable for the following:    Glucose, UA >1000 (*)    All other components within normal limits  POCT PREGNANCY, URINE  POCT PREGNANCY, URINE  URINE MICROSCOPIC-ADD ON   No results found.   No diagnosis found.    MDM  Moderate relief with Dilaudid and Dexamethasone for RL back pain and sciatica.  Will follow up with PCP in 6 days.      Medical screening examination/treatment/procedure(s) were performed by non-physician practitioner and as supervising physician I was immediately available for consultation/collaboration. Osvaldo Human, M.D.     Jethro Bastos, NP 04/12/11 0153  Carleene Cooper III, MD 04/12/11 [phone]

## 2011-04-29 ENCOUNTER — Emergency Department (HOSPITAL_COMMUNITY)
Admission: EM | Admit: 2011-04-29 | Discharge: 2011-04-29 | Disposition: A | Discharge status: Home / Self Care | Payer: Medicare Other | Attending: Emergency Medicine | Admitting: Emergency Medicine

## 2011-04-29 ENCOUNTER — Encounter (HOSPITAL_COMMUNITY): Payer: Self-pay | Admitting: *Deleted

## 2011-04-29 DIAGNOSIS — E119 Type 2 diabetes mellitus without complications: Secondary | ICD-10-CM | POA: Insufficient documentation

## 2011-04-29 DIAGNOSIS — F1910 Other psychoactive substance abuse, uncomplicated: Secondary | ICD-10-CM | POA: Insufficient documentation

## 2011-04-29 DIAGNOSIS — R51 Headache: Secondary | ICD-10-CM | POA: Insufficient documentation

## 2011-04-29 DIAGNOSIS — R000 Tachycardia, unspecified: Secondary | ICD-10-CM | POA: Insufficient documentation

## 2011-04-29 DIAGNOSIS — L680 Hirsutism: Secondary | ICD-10-CM | POA: Insufficient documentation

## 2011-04-29 DIAGNOSIS — Z765 Malingerer [conscious simulation]: Secondary | ICD-10-CM

## 2011-04-29 DIAGNOSIS — M549 Dorsalgia, unspecified: Secondary | ICD-10-CM

## 2011-04-29 LAB — URINE MICROSCOPIC-ADD ON

## 2011-04-29 LAB — POCT I-STAT, CHEM 8
BUN: 15 mg/dL (ref 6–23)
Creatinine, Ser: 1.1 mg/dL (ref 0.50–1.10)
Glucose, Bld: 261 mg/dL — ABNORMAL HIGH (ref 70–99)
Hemoglobin: 14.3 g/dL (ref 12.0–15.0)
Potassium: 4.2 mEq/L (ref 3.5–5.1)
Sodium: 139 mEq/L (ref 135–145)

## 2011-04-29 LAB — URINALYSIS, ROUTINE W REFLEX MICROSCOPIC
Bilirubin Urine: NEGATIVE
Ketones, ur: NEGATIVE mg/dL
Nitrite: NEGATIVE
Specific Gravity, Urine: 1.009 (ref 1.005–1.030)
pH: 6 (ref 5.0–8.0)

## 2011-04-29 MED ORDER — HYDROMORPHONE HCL PF 1 MG/ML IJ SOLN
1.0000 mg | Freq: Once | INTRAMUSCULAR | Status: AC
  Administered 2011-04-29: 1 mg via INTRAVENOUS
  Filled 2011-04-29: qty 1

## 2011-04-29 MED ORDER — SODIUM CHLORIDE 0.9 % IV SOLN
INTRAVENOUS | Status: DC
  Administered 2011-04-29: 16:00:00 via INTRAVENOUS

## 2011-04-29 NOTE — ED Notes (Signed)
Pt offered to be taken to restroom, states she cannot go yet, due to pain.  She states it is not yet under control.  In/out cath advised and pt refused, bedpan offered and pt refused.  EDMD notified for pain control.

## 2011-04-29 NOTE — ED Provider Notes (Signed)
History     CSN: [id number] Arrival date & time: 04/29/2011  2:05 PM   First MD Initiated Contact with Patient 04/29/11 1445      Chief Complaint  Patient presents with  . Back Pain  . Headache    (Consider location/radiation/quality/duration/timing/severity/associated sxs/prior treatment) Patient is a 35 y.o. female presenting with back pain and headaches. The history is provided by the patient.  Back Pain  This is a recurrent problem. The current episode started more than 2 days ago. The problem occurs constantly. The problem has been gradually worsening. Associated symptoms include headaches. Pertinent negatives include no numbness and no weakness.  Headache   patient states that she has 4 back pain over the last week. She's history chronic back pain she sees her primary care Dr. Mikeal Hawthorne. She states she's been unable to see him. She states she's been taking over-the-counter medicines for it. She states her pain radiates down the right leg. She states her sugars have been running a little high. She states that she'sbeen urinating more frequently. No weakness. She states it tingles down her leg. She states that she's not been able to get her medications from Dr. Mikeal Hawthorne. Review of the [place] controlled substance database shows that on December 5 a prescription was filled for 120 pills of 7.5 mg oxycodone acetaminophen. She states she did not feel these. She states she has gotten medicines for 2 months before that. The drug database shows she's got it for 6 straight months. She states she did not get the one on the fifth filled. The police been contacted and will be interviewing her about potential identity theft.  Past Medical History  Diagnosis Date  . Diabetes mellitus   . Asthma   . CHF (congestive heart failure)     History reviewed. No pertinent past surgical history.  History reviewed. No pertinent family history.  History  Substance Use Topics  . Smoking status:  Current Everyday Smoker    Types: Cigarettes  . Smokeless tobacco: Not on file  . Alcohol Use: No    OB History    Grav Para Term Preterm Abortions TAB SAB Ect Mult Living                  Review of Systems  Constitutional: Negative for chills.  HENT: Negative for neck pain.   Respiratory: Negative for chest tightness.   Genitourinary: Positive for frequency. Negative for flank pain.  Musculoskeletal: Positive for back pain and gait problem.  Neurological: Positive for headaches. Negative for weakness and numbness.  Psychiatric/Behavioral: Negative for confusion.    Allergies  Review of patient's allergies indicates no known allergies.  Home Medications   Current Outpatient Rx  Name Route Sig Dispense Refill  . AMLODIPINE BESYLATE 10 MG PO TABS Oral Take 10 mg by mouth daily.      . DULOXETINE HCL 60 MG PO CPEP Oral Take 60 mg by mouth daily.      . FUROSEMIDE 20 MG PO TABS Oral Take 20 mg by mouth daily.      Marland Kitchen GLIPIZIDE 10 MG PO TABS Oral Take 10 mg by mouth 2 (two) times daily before a meal.      . HYDROCHLOROTHIAZIDE 25 MG PO TABS Oral Take 25 mg by mouth daily.      . INSULIN GLARGINE 100 UNIT/ML [place] SOLN Subcutaneous Inject 35 Units into the skin 2 (two) times daily.      Marland Kitchen LISINOPRIL 40 MG PO TABS Oral Take  40 mg by mouth daily.      Marland Kitchen LORAZEPAM 2 MG PO TABS Oral Take 2 mg by mouth every 6 (six) hours as needed. For anxiety.     Marland Kitchen METOPROLOL TARTRATE 100 MG PO TABS Oral Take 100 mg by mouth every morning.      Marland Kitchen METOPROLOL TARTRATE 50 MG PO TABS Oral Take 50 mg by mouth 2 (two) times daily.        BP 203/123  Pulse 105  Temp(Src) 98.8 F (37.1 C) (Oral)  Resp 22  SpO2 97%  LMP 03/20/2011  Physical Exam  Nursing note and vitals reviewed. Constitutional: She is oriented to person, place, and time. She appears well-developed and well-nourished.       Morbid obesity  HENT:  Head: Normocephalic and atraumatic.       Hirsute with a beard  Eyes: EOM are normal.  Pupils are equal, round, and reactive to light.  Neck: Normal range of motion. Neck supple.  Cardiovascular: Regular rhythm and normal heart sounds.   No murmur heard.      tachycardia  Pulmonary/Chest: Effort normal and breath sounds normal. No respiratory distress. She has no wheezes. She has no rales.  Abdominal: Soft. Bowel sounds are normal. She exhibits no distension. There is no tenderness. There is no rebound and no guarding.  Musculoskeletal:       Tenderness in right lumbar paraspinal area. Pain with movement of her lower extremities.  Neurological: She is alert and oriented to person, place, and time. No cranial nerve deficit.  Skin: Skin is warm and dry.  Psychiatric: She has a normal mood and affect. Her speech is normal.    ED Course  Procedures (including critical care time)   Labs Reviewed  URINALYSIS, ROUTINE W REFLEX MICROSCOPIC  I-STAT, CHEM 8   No results found.   No diagnosis found.    MDM  Acute on chronic back pain. Patient states she's had no narcotic pain meds for this. Data base shows it was filled 10 days ago with a 30 day supply. Police have been contacted.basic lab work and urine sample is been ordered. Care has been turned over to Dr. Juleen China.         Juliet Rude. Rubin Payor, MD 04/29/11 1555

## 2011-04-29 NOTE — ED Notes (Signed)
Pt in c/o back pain radiating into right leg, states she has a history of same but pain has increased over last week, states she is unable to get with her normal doctor, pt tearful in triage, also with headache x 1 hour

## 2011-04-29 NOTE — ED Provider Notes (Signed)
Pt medically cleared for DC. Multiple requests for dilaudid in ED. Interviewed by police. Instructed to take ibuprofen/tylenol PRN pain.  Raeford Razor, MD 04/29/11 [phone]

## 2011-04-29 NOTE — ED Notes (Signed)
RN, Debra Stokes has suggested that the pt receive non-narcotic pain medication. Pt is getting restless because she has not received anything and stated " I can be in pain at home". Waiting on doctor to give medication.

## 2011-04-29 NOTE — ED Notes (Signed)
Pt states that she, "can hurt at the house," and she wants, "to be discharged."  EDMD notified.

## 2011-09-05 ENCOUNTER — Encounter (HOSPITAL_COMMUNITY): Payer: Self-pay

## 2011-09-05 ENCOUNTER — Emergency Department (HOSPITAL_COMMUNITY)
Admission: EM | Admit: 2011-09-05 | Discharge: 2011-09-05 | Disposition: A | Discharge status: Home / Self Care | Payer: Medicare Other | Attending: Emergency Medicine | Admitting: Emergency Medicine

## 2011-09-05 ENCOUNTER — Emergency Department (HOSPITAL_COMMUNITY): Payer: Medicare Other

## 2011-09-05 DIAGNOSIS — E119 Type 2 diabetes mellitus without complications: Secondary | ICD-10-CM | POA: Insufficient documentation

## 2011-09-05 DIAGNOSIS — J45909 Unspecified asthma, uncomplicated: Secondary | ICD-10-CM | POA: Insufficient documentation

## 2011-09-05 DIAGNOSIS — I10 Essential (primary) hypertension: Secondary | ICD-10-CM | POA: Insufficient documentation

## 2011-09-05 DIAGNOSIS — Z794 Long term (current) use of insulin: Secondary | ICD-10-CM | POA: Insufficient documentation

## 2011-09-05 DIAGNOSIS — M549 Dorsalgia, unspecified: Secondary | ICD-10-CM

## 2011-09-05 DIAGNOSIS — F17200 Nicotine dependence, unspecified, uncomplicated: Secondary | ICD-10-CM | POA: Insufficient documentation

## 2011-09-05 DIAGNOSIS — M545 Low back pain, unspecified: Secondary | ICD-10-CM | POA: Insufficient documentation

## 2011-09-05 DIAGNOSIS — M79609 Pain in unspecified limb: Secondary | ICD-10-CM | POA: Insufficient documentation

## 2011-09-05 DIAGNOSIS — R52 Pain, unspecified: Secondary | ICD-10-CM | POA: Insufficient documentation

## 2011-09-05 DIAGNOSIS — I509 Heart failure, unspecified: Secondary | ICD-10-CM | POA: Insufficient documentation

## 2011-09-05 DIAGNOSIS — Z79899 Other long term (current) drug therapy: Secondary | ICD-10-CM | POA: Insufficient documentation

## 2011-09-05 DIAGNOSIS — M79671 Pain in right foot: Secondary | ICD-10-CM

## 2011-09-05 DIAGNOSIS — G8929 Other chronic pain: Secondary | ICD-10-CM | POA: Insufficient documentation

## 2011-09-05 HISTORY — DX: Other chronic pain: G89.29

## 2011-09-05 HISTORY — DX: Essential (primary) hypertension: I10

## 2011-09-05 HISTORY — DX: Sleep apnea, unspecified: G47.30

## 2011-09-05 HISTORY — DX: Other psychoactive substance abuse, uncomplicated: F19.10

## 2011-09-05 HISTORY — DX: Migraine, unspecified, not intractable, without status migrainosus: G43.909

## 2011-09-05 HISTORY — DX: Dorsalgia, unspecified: M54.9

## 2011-09-05 HISTORY — DX: Malingerer (conscious simulation): Z76.5

## 2011-09-05 HISTORY — DX: Morbid (severe) obesity due to excess calories: E66.01

## 2011-09-05 MED ORDER — DIAZEPAM 5 MG PO TABS
5.0000 mg | ORAL_TABLET | Freq: Once | ORAL | Status: AC
  Administered 2011-09-05: 5 mg via ORAL
  Filled 2011-09-05: qty 1

## 2011-09-05 MED ORDER — HYDROMORPHONE HCL PF 2 MG/ML IJ SOLN
2.0000 mg | Freq: Once | INTRAMUSCULAR | Status: AC
  Administered 2011-09-05: 2 mg via INTRAMUSCULAR

## 2011-09-05 MED ORDER — CYCLOBENZAPRINE HCL 10 MG PO TABS: 10.0000 mg | ORAL_TABLET | Freq: Three times a day (TID) | ORAL | Status: AC | PRN

## 2011-09-05 NOTE — Discharge Instructions (Signed)
RESOURCE GUIDE  Dental Problems  Patients with Medicaid: [facility] [phone]                                           [phone] Phone:  [phone]                                                  Phone:  [phone]  If unable to pay or uninsured, contact:  [facility] or [facility]. to become qualified for the adult dental clinic.  Chronic Pain Problems Contact [facility]  [phone] Patients need to be referred by their primary care doctor.  Insufficient Money for Medicine Contact [employer]:  call "211" or [facility] 5711146398.  No Primary Care Doctor Call [facility]  [phone] Other agencies that provide inexpensive medical care    [facility]  [phone]    [facility]  [phone]    [facility]  [phone]    [facility]  [phone]    [facility]  [phone]    [facility]  [phone]  Psychological Services [facility]  [phone] [facility] Services  [phone] [facility]   [phone] (emergency services [phone])  Substance Abuse Resources [facility]  [phone] [facility] [phone] [facility] [place] [phone] Floydene Flock [phone] Residential & Outpatient Substance Abuse Program  [phone]  Abuse/Neglect [facility] Child Abuse Hotline 443-501-3000 [facility] Child Abuse Hotline 223-065-3258 (After Hours)  Emergency Shelter [facility] 8158049868  Maternity Homes [facility] [place] [facility] [phone] [facility] [phone]  MRSA Hotline #:   580 356 7692    [facility] Resources  [facility] [place]     [employer]                          [facility] Dept. [address] [place]                       [address]      [address]  Blondell Reveal Phone:  [phone]                                   Phone:  [phone]                 Phone:  [phone]  [facility] Mental Health Phone:  [phone]  [facility] Child Abuse Hotline 234-085-7191 [phone] (After Hours)   Take the prescription as directed.  Apply moist heat or ice to the area(s) of discomfort, for 15 minutes at a time, several times per day for the next few days.  Do not fall asleep on a heating or ice pack.  Wear the hard soled shoe for support.  Your foot xray did not show a "broken bone" (fracture) today and there are no signs of infection in your foot.  Call your regular medical doctor and your Pain Management doctor today to schedule a follow up appointment this week.  Return to the Emergency Department immediately if worsening.

## 2011-09-05 NOTE — ED Notes (Signed)
Family at bedside.  Pt offered ice pack for foot but states it won't help her.  Pt aware meds can't be given until EDP sees her.

## 2011-09-05 NOTE — ED Provider Notes (Signed)
History     CSN: [id number]  Arrival date & time 09/05/11  1234   First MD Initiated Contact with Patient 09/05/11 1242      Chief Complaint  Patient presents with  . Foot Pain    right  . Back Pain    HPI Pt was seen at 1345.  Per pt, c/o gradual onset and persistence of constant acute flair of her chronic right sided low back "pain" which radiates down her right leg into her right foot for the past several days.  Describes her pain as "tingling."  Pain in her foot is located in her right dorsal foot.  Denies incont/retention of bowel or bladder, no saddle anesthesia, no focal motor weakness, no fevers, no rash, no injury.   The patient has a significant history of similar symptoms previously, recently being evaluated for this complaint and multiple prior evals for same.     Past Medical History  Diagnosis Date  . Diabetes mellitus   . Asthma   . CHF (congestive heart failure)   . Chronic back pain   . Migraine headache   . Hypertension   . Morbid obesity   . Polysubstance abuse   . Sleep apnea   . Drug-seeking behavior     History reviewed. No pertinent past surgical history.   History  Substance Use Topics  . Smoking status: Current Everyday Smoker    Types: Cigarettes  . Smokeless tobacco: Not on file  . Alcohol Use: No    Review of Systems ROS: Statement: All systems negative except as marked or noted in the HPI; Constitutional: Negative for fever and chills. ; ; Eyes: Negative for eye pain, redness and discharge. ; ; ENMT: Negative for ear pain, hoarseness, nasal congestion, sinus pressure and sore throat. ; ; Cardiovascular: Negative for chest pain, palpitations, diaphoresis, dyspnea and peripheral edema. ; ; Respiratory: Negative for cough, wheezing and stridor. ; ; Gastrointestinal: Negative for nausea, vomiting, diarrhea, abdominal pain, blood in stool, hematemesis, jaundice and rectal bleeding. . ; ; Genitourinary: Negative for dysuria, flank pain and  hematuria. ; ; Musculoskeletal: +low back pain, right foot pain. Negative for neck pain. Negative for swelling and trauma.; ; Skin: Negative for pruritus, rash, abrasions, blisters, bruising and skin lesion.; ; Neuro: Negative for headache, lightheadedness and neck stiffness. Negative for weakness, altered level of consciousness , altered mental status, extremity weakness, paresthesias, involuntary movement, seizure and syncope.     Allergies  Review of patient's allergies indicates no known allergies.  Home Medications   Current Outpatient Rx  Name Route Sig Dispense Refill  . AMLODIPINE BESYLATE 10 MG PO TABS Oral Take 10 mg by mouth daily.      . DULOXETINE HCL 60 MG PO CPEP Oral Take 60 mg by mouth daily.      . FUROSEMIDE 20 MG PO TABS Oral Take 20 mg by mouth daily.      Marland Kitchen HYDROCHLOROTHIAZIDE 25 MG PO TABS Oral Take 25 mg by mouth daily.      . INSULIN GLARGINE 100 UNIT/ML [place] SOLN Subcutaneous Inject 22 Units into the skin 2 (two) times daily.     Marland Kitchen LISINOPRIL 40 MG PO TABS Oral Take 40 mg by mouth 2 (two) times daily.     Marland Kitchen LORAZEPAM 2 MG PO TABS Oral Take 2 mg by mouth every 6 (six) hours as needed. For anxiety.     Marland Kitchen METOPROLOL TARTRATE 100 MG PO TABS Oral Take 100 mg by mouth every morning.      Marland Kitchen  METOPROLOL TARTRATE 50 MG PO TABS Oral Take 50 mg by mouth 2 (two) times daily.      . OXYCODONE-ACETAMINOPHEN 7.5-325 MG PO TABS Oral Take 1 tablet by mouth every 4 (four) hours as needed. pain    . CYCLOBENZAPRINE HCL 10 MG PO TABS Oral Take 1 tablet (10 mg total) by mouth 3 (three) times daily as needed for muscle spasms. 20 tablet 0    BP 157/90  Pulse 82  Temp(Src) 97.3 F (36.3 C) (Oral)  Resp 18  SpO2 100%  LMP 08/30/2011  Physical Exam 12350: Physical examination:  Nursing notes reviewed; Vital signs and O2 SAT reviewed;  Constitutional: Well developed, Well nourished, Well hydrated, In no acute distress; Head:  Normocephalic, atraumatic; Eyes: EOMI, PERRL, No scleral  icterus; ENMT: Mouth and pharynx normal, Mucous membranes moist; Neck: Supple, Full range of motion, No lymphadenopathy; Cardiovascular: Regular rate and rhythm, No murmur, rub, or gallop; Respiratory: Breath sounds clear & equal bilaterally, No rales, rhonchi, wheezes, or rub, Normal respiratory effort/excursion; Chest: Nontender, Movement normal; Abdomen: Soft, Nontender, Nondistended, Normal bowel sounds; Spine:  No midline CS, TS, LS tenderness.  +TTP right lumbar paraspinal muscles.; Genitourinary: No CVA tenderness; Extremities: Pulses normal, +TTP dorsal forefoot without ecchymosis, erythema, soft tissue crepitus, edema, or open wounds to dorsal or plantar foot. No gross sensory deficits in foot.  Strong pedal pulse with brisk cap refill in toes. NT right ankle, calf, and knee. RLE muscle compartments soft.  No calf edema or asymmetry.; Neuro: AA&Ox3, Major CN grossly intact.  No gross focal motor or sensory deficits in extremities.; Skin: Color normal, Warm, Dry, no rash.     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 This could be symptomatic.  Original Report Authenticated By: Thomasenia Sales, M.D.      MDM  MDM Reviewed: nursing note, vitals and previous chart Reviewed previous: MRI Interpretation: x-ray   Dg Foot Complete Right 09/05/2011  *RADIOLOGY REPORT*  Clinical Data: Right foot pain.  No known injury.  RIGHT FOOT COMPLETE - 3+ VIEW  Comparison:  None.  Findings:  There is no evidence of fracture or dislocation.  There is no evidence of arthropathy or other focal bone abnormality. Soft tissues are unremarkable.  IMPRESSION: Negative.  Original Report Authenticated By: Danae Orleans, M.D.       3:16 PM:   Controlled Substance Database accessed:  Pt filled #120, oxycodone/APA 7.5-325mg  tabs on 08/21/11, rx by Dr. Earlie Lou.  Will not rx more narcotics today.  Will give post-op shoe for comfort.  Pt denies injury to foot.  No fx on XR, no signs of soft tissue infection, compartment syndrome, or PAD given strong pedal pulses and brisk cap refill in toes which are W/D/good color.  No concerning signs for cauda equina today.  Dx testing d/w pt and family.  Questions answered.  Verb understanding, agreeable to d/c home with outpt f/u.      Laray Anger, DO 09/07/11 1138

## 2011-09-05 NOTE — ED Notes (Signed)
Pt in from home with c/o right foot pain denies recent injury states hx of low back pain states pain and numbness radiates to the right foot states prescribed medications has been ineffective

## 2011-10-08 ENCOUNTER — Encounter (HOSPITAL_COMMUNITY): Payer: Self-pay

## 2011-10-08 ENCOUNTER — Emergency Department (HOSPITAL_COMMUNITY): Payer: Medicare Other

## 2011-10-08 ENCOUNTER — Emergency Department (HOSPITAL_COMMUNITY)
Admission: EM | Admit: 2011-10-08 | Discharge: 2011-10-08 | Disposition: A | Discharge status: Home / Self Care | Payer: Medicare Other | Attending: Emergency Medicine | Admitting: Emergency Medicine

## 2011-10-08 DIAGNOSIS — M549 Dorsalgia, unspecified: Secondary | ICD-10-CM | POA: Insufficient documentation

## 2011-10-08 DIAGNOSIS — R112 Nausea with vomiting, unspecified: Secondary | ICD-10-CM | POA: Insufficient documentation

## 2011-10-08 DIAGNOSIS — R079 Chest pain, unspecified: Secondary | ICD-10-CM | POA: Insufficient documentation

## 2011-10-08 DIAGNOSIS — R05 Cough: Secondary | ICD-10-CM | POA: Insufficient documentation

## 2011-10-08 DIAGNOSIS — G8929 Other chronic pain: Secondary | ICD-10-CM | POA: Insufficient documentation

## 2011-10-08 DIAGNOSIS — R0602 Shortness of breath: Secondary | ICD-10-CM | POA: Insufficient documentation

## 2011-10-08 DIAGNOSIS — R1013 Epigastric pain: Secondary | ICD-10-CM | POA: Insufficient documentation

## 2011-10-08 DIAGNOSIS — Z794 Long term (current) use of insulin: Secondary | ICD-10-CM | POA: Insufficient documentation

## 2011-10-08 DIAGNOSIS — I509 Heart failure, unspecified: Secondary | ICD-10-CM | POA: Insufficient documentation

## 2011-10-08 DIAGNOSIS — R739 Hyperglycemia, unspecified: Secondary | ICD-10-CM

## 2011-10-08 DIAGNOSIS — J45909 Unspecified asthma, uncomplicated: Secondary | ICD-10-CM | POA: Insufficient documentation

## 2011-10-08 DIAGNOSIS — Z79899 Other long term (current) drug therapy: Secondary | ICD-10-CM | POA: Insufficient documentation

## 2011-10-08 DIAGNOSIS — I10 Essential (primary) hypertension: Secondary | ICD-10-CM | POA: Insufficient documentation

## 2011-10-08 DIAGNOSIS — E119 Type 2 diabetes mellitus without complications: Secondary | ICD-10-CM | POA: Insufficient documentation

## 2011-10-08 DIAGNOSIS — R059 Cough, unspecified: Secondary | ICD-10-CM | POA: Insufficient documentation

## 2011-10-08 LAB — BASIC METABOLIC PANEL
CO2: 27 mEq/L (ref 19–32)
Chloride: 99 mEq/L (ref 96–112)
Creatinine, Ser: 1.18 mg/dL — ABNORMAL HIGH (ref 0.50–1.10)
GFR calc Af Amer: 68 mL/min — ABNORMAL LOW (ref >90)
Sodium: 136 mEq/L (ref 135–145)

## 2011-10-08 LAB — GLUCOSE, CAPILLARY: Glucose-Capillary: 331 mg/dL — ABNORMAL HIGH (ref 70–99)

## 2011-10-08 LAB — DIFFERENTIAL
Basophils Absolute: 0 10*3/uL (ref 0.0–0.1)
Basophils Relative: 0 % (ref 0–1)
Lymphocytes Relative: 13 % (ref 12–46)
Monocytes Absolute: 0.5 10*3/uL (ref 0.1–1.0)
Neutro Abs: 5.7 10*3/uL (ref 1.7–7.7)
Neutrophils Relative %: 79 % — ABNORMAL HIGH (ref 43–77)

## 2011-10-08 LAB — CBC
MCHC: 31.9 g/dL (ref 30.0–36.0)
Platelets: 250 10*3/uL (ref 150–400)
RDW: 14.7 % (ref 11.5–15.5)
WBC: 7.2 10*3/uL (ref 4.0–10.5)

## 2011-10-08 LAB — TROPONIN I: Troponin I: <0.3 ng/mL (ref <0.30)

## 2011-10-08 MED ORDER — IOHEXOL 300 MG/ML  SOLN
125.0000 mL | Freq: Once | INTRAMUSCULAR | Status: AC | PRN
  Administered 2011-10-08: 125 mL via INTRAVENOUS

## 2011-10-08 MED ORDER — SODIUM CHLORIDE 0.9 % IV BOLUS (SEPSIS)
500.0000 mL | Freq: Once | INTRAVENOUS | Status: AC
  Administered 2011-10-08: 500 mL via INTRAVENOUS

## 2011-10-08 MED ORDER — KETOROLAC TROMETHAMINE 30 MG/ML IJ SOLN
30.0000 mg | Freq: Once | INTRAMUSCULAR | Status: AC
  Administered 2011-10-08: 30 mg via INTRAVENOUS
  Filled 2011-10-08: qty 1

## 2011-10-08 MED ORDER — FUROSEMIDE 10 MG/ML IJ SOLN
40.0000 mg | Freq: Once | INTRAMUSCULAR | Status: AC
  Administered 2011-10-08: 40 mg via INTRAVENOUS
  Filled 2011-10-08: qty 4

## 2011-10-08 MED ORDER — ASPIRIN 81 MG PO CHEW
324.0000 mg | CHEWABLE_TABLET | Freq: Once | ORAL | Status: AC
  Administered 2011-10-08: 324 mg via ORAL
  Filled 2011-10-08: qty 4

## 2011-10-08 MED ORDER — ASPIRIN 81 MG PO CHEW: 324.0000 mg | CHEWABLE_TABLET | Freq: Once | ORAL | Status: AC

## 2011-10-08 NOTE — ED Notes (Signed)
C/o center CP onset last night with SOB, N/V, coughing, pain 10/10, sharp, penetrating pain, 2 week had heart cath and was negative. Sat 100% on 2 L [place], and HR 98, BP 167/106, no diaphoresis noted, denied jaw pain and arm pain

## 2011-10-08 NOTE — ED Notes (Signed)
Dr. Rosalia Hammers was notified regarding discharge vital sign BP of 173/110, and was ask if it is okay to discharge this patient. Dr. Rosalia Hammers sts she is fine discharging the patient on such BP.

## 2011-10-08 NOTE — ED Provider Notes (Signed)
History     CSN: [id number]  Arrival date & time 10/08/11  1139   First MD Initiated Contact with Patient 10/08/11 1153      Chief Complaint  Patient presents with  . Chest Pain    (Consider location/radiation/quality/duration/timing/severity/associated sxs/prior treatment) HPI 36 year old female with a history of congestive heart failure, insulin-dependent diabetes, high blood pressure and multiple visits for pain comes in today complaining of sudden onset of shortness of breath last night that heris underneath the left breast radiating through to the back. She describes it as sharp in nature. It is continuous. It increases with inspiration. She is on home oxygen and use her oxygen last night. She has associated nausea and vomiting. She also has some nonproductive cough. She states that she had a catheterization at [facility] in [place] and was told that "everything is fine" this occurred 2 weeks Past Medical History  Diagnosis Date  . Diabetes mellitus   . Asthma   . CHF (congestive heart failure)   . Chronic back pain   . Migraine headache   . Hypertension   . Morbid obesity   . Polysubstance abuse   . Sleep apnea   . Drug-seeking behavior     Past Surgical History  Procedure Date  . Cholecystectomy   . Cardiac catheterization     No family history on file.  History  Substance Use Topics  . Smoking status: Current Everyday Smoker    Types: Cigarettes  . Smokeless tobacco: Not on file  . Alcohol Use: No    OB History    Grav Para Term Preterm Abortions TAB SAB Ect Mult Living                  Review of Systems  All other systems reviewed and are negative.    Allergies  Review of patient's allergies indicates no known allergies.  Home Medications   Current Outpatient Rx  Name Route Sig Dispense Refill  . AMLODIPINE BESYLATE 10 MG PO TABS Oral Take 10 mg by mouth daily.      . DULOXETINE HCL 60 MG PO CPEP Oral Take 60 mg by mouth daily.      .  FUROSEMIDE 20 MG PO TABS Oral Take 20 mg by mouth daily.      Marland Kitchen HYDROCHLOROTHIAZIDE 25 MG PO TABS Oral Take 25 mg by mouth daily.      . INSULIN GLARGINE 100 UNIT/ML [place] SOLN Subcutaneous Inject 22 Units into the skin 2 (two) times daily.     Marland Kitchen LISINOPRIL 40 MG PO TABS Oral Take 40 mg by mouth 2 (two) times daily.     Marland Kitchen LORAZEPAM 2 MG PO TABS Oral Take 2 mg by mouth every 6 (six) hours as needed. For anxiety.     Marland Kitchen METOPROLOL TARTRATE 100 MG PO TABS Oral Take 100 mg by mouth every morning.      Marland Kitchen METOPROLOL TARTRATE 50 MG PO TABS Oral Take 50 mg by mouth 2 (two) times daily.      Marland Kitchen NAPROXEN SODIUM 220 MG PO TABS Oral Take 220 mg by mouth 2 (two) times daily with a meal.      BP 167/106  Pulse 98  Temp(Src) 98.7 F (37.1 C) (Oral)  Resp 24  SpO2 100%  LMP 09/29/2011  Physical Exam  Nursing note and vitals reviewed. Constitutional: She appears well-developed and well-nourished.       Morbidly obese  HENT:  Head: Normocephalic and atraumatic.  Eyes: Conjunctivae  and EOM are normal. Pupils are equal, round, and reactive to light.  Neck: Normal range of motion. Neck supple.  Cardiovascular: Normal rate, regular rhythm, normal heart sounds and intact distal pulses.   Pulmonary/Chest: Effort normal and breath sounds normal.  Abdominal: Soft. Bowel sounds are normal.       Mild epigastric tenderness  Musculoskeletal: Normal range of motion.  Neurological: She is alert.  Skin: Skin is warm and dry.  Psychiatric: She has a normal mood and affect. Thought content normal.    ED Course  Procedures (including critical care time)  Labs Reviewed  GLUCOSE, CAPILLARY - Abnormal; Notable for the following:    Glucose-Capillary 331 (*)    All other components within normal limits  DIFFERENTIAL - Abnormal; Notable for the following:    Neutrophils Relative 79 (*)    All other components within normal limits  BASIC METABOLIC PANEL - Abnormal; Notable for the following:    Glucose, Bld 352 (*)     Creatinine, Ser 1.18 (*)    GFR calc non Af Amer 59 (*)    GFR calc Af Amer 68 (*)    All other components within normal limits  CBC  TROPONIN I   No results found.   No diagnosis found.    Date: 10/08/2011  Rate: 100  Rhythm: normal sinus rhythm  QRS Axis: normal  Intervals: normal  ST/T Wave abnormalities: nonspecific ST/T changes  Conduction Disutrbances:none  Narrative Interpretation:   Old EKG Reviewed: no sig change   MDM   Results for orders placed during the hospital encounter of 10/08/11  GLUCOSE, CAPILLARY      Component Value Range   Glucose-Capillary 331 (*) 70 - 99 (mg/dL)  CBC      Component Value Range   WBC 7.2  4.0 - 10.5 (K/uL)   RBC 4.12  3.87 - 5.11 (MIL/uL)   Hemoglobin 12.2  12.0 - 15.0 (g/dL)   HCT 16.1  09.6 - 04.5 (%)   MCV 93.0  78.0 - 100.0 (fL)   MCH 29.6  26.0 - 34.0 (pg)   MCHC 31.9  30.0 - 36.0 (g/dL)   RDW 40.9  81.1 - 91.4 (%)   Platelets 250  150 - 400 (K/uL)  DIFFERENTIAL      Component Value Range   Neutrophils Relative 79 (*) 43 - 77 (%)   Neutro Abs 5.7  1.7 - 7.7 (K/uL)   Lymphocytes Relative 13  12 - 46 (%)   Lymphs Abs 1.0  0.7 - 4.0 (K/uL)   Monocytes Relative 7  3 - 12 (%)   Monocytes Absolute 0.5  0.1 - 1.0 (K/uL)   Eosinophils Relative 1  0 - 5 (%)   Eosinophils Absolute 0.1  0.0 - 0.7 (K/uL)   Basophils Relative 0  0 - 1 (%)   Basophils Absolute 0.0  0.0 - 0.1 (K/uL)  BASIC METABOLIC PANEL      Component Value Range   Sodium 136  135 - 145 (mEq/L)   Potassium 3.6  3.5 - 5.1 (mEq/L)   Chloride 99  96 - 112 (mEq/L)   CO2 27  19 - 32 (mEq/L)   Glucose, Bld 352 (*) 70 - 99 (mg/dL)   BUN 15  6 - 23 (mg/dL)   Creatinine, Ser 7.82 (*) 0.50 - 1.10 (mg/dL)   Calcium 8.6  8.4 - 95.6 (mg/dL)   GFR calc non Af Amer 59 (*) >90 (mL/min)   GFR calc Af Amer 68 (*) >  90 (mL/min)  TROPONIN I      Component Value Range   Troponin I <0.30  <0.30 (ng/mL)   Results of CT discussed with Dr. Gwenith Daily. I discussed the lab  findings and the CT results with the patient. Patient wishes to be discharged at this time and will followup with Dr. August Luz to obtain outpatient CT of neck. CT o chest shows abnormality which could be consistent with normal musculature but mass cannot be ruled out  this does not appear to be the cause of today's problems. Her pain is in the epigastrium. Should not any obvious abnormalities in this area. Patient is hypertensive however her blood pressures are she has been here previously have been significant significantly elevated at 160/90-150 over is also hyperglycemic but is a known diabetic. She received IV fluids here. She is advised to return if worse and have close followup.       Hilario Quarry, MD 10/08/11 [phone]

## 2011-10-08 NOTE — ED Notes (Signed)
Patient transported to CT 

## 2011-10-08 NOTE — Discharge Instructions (Signed)
Please followup with Dr. Mikeal Hawthorne in the next one to 2 weeks to have a CT of the neck. Your blood sugar has been elevated here today. He received IV fluids. Please continue to check your home blood sugars and use medications as prescribed for your elevated blood sugars. Your blood pressure is also elevated, please continue taking blood pressure medicines and have this followed up with Dr. Mikeal Hawthorne

## 2011-10-08 NOTE — ED Notes (Signed)
Pt's SOB worsen with exertion and ambulation

## 2011-10-23 ENCOUNTER — Emergency Department (HOSPITAL_COMMUNITY)
Admission: EM | Admit: 2011-10-23 | Discharge: 2011-10-23 | Disposition: A | Discharge status: Home / Self Care | Payer: Medicare Other | Attending: Emergency Medicine | Admitting: Emergency Medicine

## 2011-10-23 ENCOUNTER — Encounter (HOSPITAL_COMMUNITY): Payer: Self-pay | Admitting: *Deleted

## 2011-10-23 DIAGNOSIS — Z794 Long term (current) use of insulin: Secondary | ICD-10-CM | POA: Insufficient documentation

## 2011-10-23 DIAGNOSIS — G4730 Sleep apnea, unspecified: Secondary | ICD-10-CM | POA: Insufficient documentation

## 2011-10-23 DIAGNOSIS — J45909 Unspecified asthma, uncomplicated: Secondary | ICD-10-CM | POA: Insufficient documentation

## 2011-10-23 DIAGNOSIS — N764 Abscess of vulva: Secondary | ICD-10-CM | POA: Insufficient documentation

## 2011-10-23 DIAGNOSIS — I10 Essential (primary) hypertension: Secondary | ICD-10-CM | POA: Insufficient documentation

## 2011-10-23 DIAGNOSIS — F17200 Nicotine dependence, unspecified, uncomplicated: Secondary | ICD-10-CM | POA: Insufficient documentation

## 2011-10-23 DIAGNOSIS — E119 Type 2 diabetes mellitus without complications: Secondary | ICD-10-CM | POA: Insufficient documentation

## 2011-10-23 DIAGNOSIS — I509 Heart failure, unspecified: Secondary | ICD-10-CM | POA: Insufficient documentation

## 2011-10-23 DIAGNOSIS — L0291 Cutaneous abscess, unspecified: Secondary | ICD-10-CM

## 2011-10-23 LAB — GLUCOSE, CAPILLARY: Glucose-Capillary: 217 mg/dL — ABNORMAL HIGH (ref 70–99)

## 2011-10-23 MED ORDER — LIDOCAINE HCL 2 % IJ SOLN
INTRAMUSCULAR | Status: AC
  Filled 2011-10-23: qty 1

## 2011-10-23 MED ORDER — MORPHINE SULFATE 4 MG/ML IJ SOLN
4.0000 mg | INTRAMUSCULAR | Status: AC | PRN
  Administered 2011-10-23 (×3): 4 mg via INTRAVENOUS
  Filled 2011-10-23 (×3): qty 1

## 2011-10-23 MED ORDER — LIDOCAINE-EPINEPHRINE 2 %-1:100000 IJ SOLN: 20.0000 mL | Freq: Once | INTRAMUSCULAR | Status: DC

## 2011-10-23 MED ORDER — METOPROLOL TARTRATE 25 MG PO TABS
100.0000 mg | ORAL_TABLET | ORAL | Status: DC
  Administered 2011-10-23: 100 mg via ORAL
  Filled 2011-10-23: qty 4

## 2011-10-23 MED ORDER — SULFAMETHOXAZOLE-TRIMETHOPRIM 800-160 MG PO TABS: 1.0000 | ORAL_TABLET | Freq: Two times a day (BID) | ORAL | Status: AC

## 2011-10-23 MED ORDER — HYDROCHLOROTHIAZIDE 25 MG PO TABS
25.0000 mg | ORAL_TABLET | Freq: Every day | ORAL | Status: DC
  Administered 2011-10-23: 25 mg via ORAL
  Filled 2011-10-23: qty 1

## 2011-10-23 MED ORDER — HYDROCODONE-ACETAMINOPHEN 5-325 MG PO TABS: 1.0000 | ORAL_TABLET | Freq: Four times a day (QID) | ORAL | Status: AC | PRN

## 2011-10-23 NOTE — Discharge Instructions (Signed)

## 2011-10-23 NOTE — ED Notes (Signed)
Pt states she has a vaginal abscess on the left labia. Pt states it started out as a small bumb and has became larger and painful. Pt states it is painful to sit

## 2011-10-23 NOTE — ED Provider Notes (Signed)
History     CSN: [id number]  Arrival date & time 10/23/11  1033   First MD Initiated Contact with Patient 10/23/11 1037      Chief Complaint  Patient presents with  . Abscess    vaginal    HPI Pt has presents with increased abscess swelling.  It has been increasing in size over the last week.  The pain is on the left side of her vagina.  Pt has had this before.  Pt tried to call her PCP but they were not available.  Palpation increases the pain.  The pain is severe.  No fever. Pt has not taken her BP this morning. Past Medical History  Diagnosis Date  . Diabetes mellitus   . Asthma   . CHF (congestive heart failure)   . Chronic back pain   . Migraine headache   . Hypertension   . Morbid obesity   . Polysubstance abuse   . Sleep apnea   . Drug-seeking behavior     Past Surgical History  Procedure Date  . Cholecystectomy   . Cardiac catheterization     No family history on file.  History  Substance Use Topics  . Smoking status: Current Everyday Smoker    Types: Cigarettes  . Smokeless tobacco: Not on file  . Alcohol Use: No    OB History    Grav Para Term Preterm Abortions TAB SAB Ect Mult Living                  Review of Systems  Constitutional: Negative for fever.  Genitourinary: Positive for vaginal pain.  All other systems reviewed and are negative.    Allergies  Review of patient's allergies indicates no known allergies.  Home Medications   Current Outpatient Rx  Name Route Sig Dispense Refill  . FUROSEMIDE 20 MG PO TABS Oral Take 20 mg by mouth daily.      Marland Kitchen HYDROCHLOROTHIAZIDE 25 MG PO TABS Oral Take 25 mg by mouth daily.      . INSULIN GLARGINE 100 UNIT/ML [place] SOLN Subcutaneous Inject 22 Units into the skin 2 (two) times daily.     Marland Kitchen LORAZEPAM 2 MG PO TABS Oral Take 2 mg by mouth every 6 (six) hours as needed. For anxiety.     Marland Kitchen METOPROLOL TARTRATE 100 MG PO TABS Oral Take 100 mg by mouth every morning.      Marland Kitchen METOPROLOL TARTRATE 50 MG PO  TABS Oral Take 50 mg by mouth 2 (two) times daily.      Marland Kitchen NAPROXEN SODIUM 220 MG PO TABS Oral Take 220 mg by mouth 2 (two) times daily with a meal.    . PHENYLEPH-DOXYLAMINE-DM-APAP 5-6.25-10-325 MG/15ML PO LIQD Oral Take 30 mLs by mouth every 6 (six) hours as needed. For cough and cold      BP 177/91  Pulse 105  Temp(Src) 98.4 F (36.9 C) (Oral)  Resp 18  SpO2 96%  LMP 09/29/2011  Physical Exam  Nursing note and vitals reviewed. Constitutional: She appears distressed.       Morbidly obese  HENT:  Head: Normocephalic and atraumatic.  Right Ear: External ear normal.  Left Ear: External ear normal.  Eyes: Conjunctivae are normal. Right eye exhibits no discharge. Left eye exhibits no discharge. No scleral icterus.  Neck: Neck supple. No tracheal deviation present.  Cardiovascular: Normal rate, regular rhythm and intact distal pulses.   Pulmonary/Chest: Effort normal and breath sounds normal. No stridor. No respiratory distress. She  has no wheezes. She has no rales.  Abdominal: Soft. Bowel sounds are normal. She exhibits no distension. There is no tenderness. There is no rebound and no guarding.  Genitourinary: Vagina normal. There is tenderness on the left labia. There is no injury on the left labia.       Fluctuance and induration in the left labia, Bartholin's region without erythema or edema  Musculoskeletal: She exhibits no edema and no tenderness.  Neurological: She is alert. She has normal strength. No sensory deficit. Cranial nerve deficit:  no gross defecits noted. She exhibits normal muscle tone. She displays no seizure activity. Coordination normal.  Skin: Skin is warm and dry. No rash noted.  Psychiatric: She has a normal mood and affect.    ED Course  INCISION AND DRAINAGE Performed by: Linwood Dibbles R Authorized by: Linwood Dibbles R Consent: Verbal consent obtained. Risks and benefits: risks, benefits and alternatives were discussed Consent given by: patient Type:  abscess Body area: anogenital Location details: vulva Anesthesia: local infiltration Local anesthetic: lidocaine 2% with epinephrine Patient sedated: no Scalpel size: 11 Incision type: single straight Complexity: complex Drainage: purulent Drainage amount: copious Wound treatment: wound left open Packing material: 1/4 in iodoform gauze Patient tolerance: Patient tolerated the procedure well with no immediate complications.   (including critical care time)  Labs Reviewed  GLUCOSE, CAPILLARY - Abnormal; Notable for the following:    Glucose-Capillary 217 (*)    All other components within normal limits  LAB REPORT - SCANNED   No results found.   1. Abscess       MDM  The patient's symptoms are consistent with a labial abscess. Will plan on incision and drainage. IV pain medications administered.  DC home on oral bactrim and pain meds.         Celene Kras, MD 10/24/11 [phone]

## 2011-10-28 ENCOUNTER — Emergency Department (HOSPITAL_COMMUNITY): Payer: Medicare Other

## 2011-10-28 ENCOUNTER — Inpatient Hospital Stay (HOSPITAL_COMMUNITY)
Admission: EM | Admit: 2011-10-28 | Discharge: 2011-11-04 | DRG: 758 | Disposition: A | Discharge status: Home / Self Care | Payer: Medicare Other | Attending: Obstetrics & Gynecology | Admitting: Obstetrics & Gynecology

## 2011-10-28 ENCOUNTER — Encounter (HOSPITAL_COMMUNITY): Payer: Self-pay | Admitting: *Deleted

## 2011-10-28 DIAGNOSIS — E1165 Type 2 diabetes mellitus with hyperglycemia: Secondary | ICD-10-CM

## 2011-10-28 DIAGNOSIS — N2889 Other specified disorders of kidney and ureter: Secondary | ICD-10-CM | POA: Diagnosis present

## 2011-10-28 DIAGNOSIS — R221 Localized swelling, mass and lump, neck: Secondary | ICD-10-CM | POA: Diagnosis present

## 2011-10-28 DIAGNOSIS — E118 Type 2 diabetes mellitus with unspecified complications: Secondary | ICD-10-CM

## 2011-10-28 DIAGNOSIS — R590 Localized enlarged lymph nodes: Secondary | ICD-10-CM | POA: Diagnosis present

## 2011-10-28 DIAGNOSIS — I129 Hypertensive chronic kidney disease with stage 1 through stage 4 chronic kidney disease, or unspecified chronic kidney disease: Secondary | ICD-10-CM | POA: Diagnosis present

## 2011-10-28 DIAGNOSIS — Z6841 Body Mass Index (BMI) 40.0 and over, adult: Secondary | ICD-10-CM

## 2011-10-28 DIAGNOSIS — F1910 Other psychoactive substance abuse, uncomplicated: Secondary | ICD-10-CM | POA: Diagnosis present

## 2011-10-28 DIAGNOSIS — IMO0001 Reserved for inherently not codable concepts without codable children: Secondary | ICD-10-CM

## 2011-10-28 DIAGNOSIS — I509 Heart failure, unspecified: Secondary | ICD-10-CM

## 2011-10-28 DIAGNOSIS — I10 Essential (primary) hypertension: Secondary | ICD-10-CM | POA: Diagnosis present

## 2011-10-28 DIAGNOSIS — Z8679 Personal history of other diseases of the circulatory system: Secondary | ICD-10-CM

## 2011-10-28 DIAGNOSIS — G4733 Obstructive sleep apnea (adult) (pediatric): Secondary | ICD-10-CM | POA: Diagnosis present

## 2011-10-28 DIAGNOSIS — N764 Abscess of vulva: Secondary | ICD-10-CM

## 2011-10-28 DIAGNOSIS — G4730 Sleep apnea, unspecified: Secondary | ICD-10-CM

## 2011-10-28 DIAGNOSIS — IMO0002 Reserved for concepts with insufficient information to code with codable children: Secondary | ICD-10-CM | POA: Diagnosis present

## 2011-10-28 DIAGNOSIS — N183 Chronic kidney disease, stage 3 unspecified: Secondary | ICD-10-CM | POA: Diagnosis present

## 2011-10-28 DIAGNOSIS — E669 Obesity, unspecified: Secondary | ICD-10-CM

## 2011-10-28 DIAGNOSIS — L02219 Cutaneous abscess of trunk, unspecified: Secondary | ICD-10-CM | POA: Diagnosis present

## 2011-10-28 DIAGNOSIS — Z720 Tobacco use: Secondary | ICD-10-CM | POA: Diagnosis present

## 2011-10-28 DIAGNOSIS — J449 Chronic obstructive pulmonary disease, unspecified: Secondary | ICD-10-CM | POA: Diagnosis present

## 2011-10-28 DIAGNOSIS — E119 Type 2 diabetes mellitus without complications: Secondary | ICD-10-CM

## 2011-10-28 LAB — DIFFERENTIAL
Basophils Absolute: 0 10*3/uL (ref 0.0–0.1)
Lymphocytes Relative: 19 % (ref 12–46)
Lymphs Abs: 1.7 10*3/uL (ref 0.7–4.0)
Monocytes Absolute: 0.5 10*3/uL (ref 0.1–1.0)
Monocytes Relative: 6 % (ref 3–12)
Neutro Abs: 6.6 10*3/uL (ref 1.7–7.7)

## 2011-10-28 LAB — BASIC METABOLIC PANEL
BUN: 8 mg/dL (ref 6–23)
CO2: 28 mEq/L (ref 19–32)
Chloride: 98 mEq/L (ref 96–112)
Creatinine, Ser: 1.21 mg/dL — ABNORMAL HIGH (ref 0.50–1.10)
Glucose, Bld: 249 mg/dL — ABNORMAL HIGH (ref 70–99)

## 2011-10-28 LAB — CBC
HCT: 37.1 % (ref 36.0–46.0)
Hemoglobin: 12 g/dL (ref 12.0–15.0)
WBC: 8.8 10*3/uL (ref 4.0–10.5)

## 2011-10-28 LAB — GLUCOSE, CAPILLARY: Glucose-Capillary: 234 mg/dL — ABNORMAL HIGH (ref 70–99)

## 2011-10-28 MED ORDER — IOHEXOL 300 MG/ML  SOLN
100.0000 mL | Freq: Once | INTRAMUSCULAR | Status: AC | PRN
  Administered 2011-10-28: 100 mL via INTRAVENOUS

## 2011-10-28 MED ORDER — HYDROCHLOROTHIAZIDE 25 MG PO TABS
25.0000 mg | ORAL_TABLET | Freq: Every day | ORAL | Status: DC
  Administered 2011-10-28 – 2011-10-30 (×3): 25 mg via ORAL
  Filled 2011-10-28 (×4): qty 1

## 2011-10-28 MED ORDER — ONDANSETRON HCL 4 MG/2ML IJ SOLN
4.0000 mg | Freq: Once | INTRAMUSCULAR | Status: AC
  Administered 2011-10-28: 4 mg via INTRAVENOUS
  Filled 2011-10-28: qty 2

## 2011-10-28 MED ORDER — METRONIDAZOLE IN NACL 5-0.79 MG/ML-% IV SOLN
500.0000 mg | Freq: Three times a day (TID) | INTRAVENOUS | Status: DC
  Administered 2011-10-28 – 2011-10-29 (×2): 500 mg via INTRAVENOUS
  Filled 2011-10-28 (×3): qty 100

## 2011-10-28 MED ORDER — HYDROMORPHONE HCL PF 1 MG/ML IJ SOLN
1.0000 mg | Freq: Once | INTRAMUSCULAR | Status: AC
  Administered 2011-10-28: 1 mg via INTRAVENOUS
  Filled 2011-10-28: qty 1

## 2011-10-28 MED ORDER — LORAZEPAM 1 MG PO TABS
2.0000 mg | ORAL_TABLET | Freq: Two times a day (BID) | ORAL | Status: DC | PRN
  Administered 2011-10-29 – 2011-11-03 (×8): 2 mg via ORAL
  Filled 2011-10-28 (×2): qty 2
  Filled 2011-10-28: qty 1
  Filled 2011-10-28 (×5): qty 2

## 2011-10-28 MED ORDER — METOPROLOL TARTRATE 25 MG PO TABS
50.0000 mg | ORAL_TABLET | Freq: Once | ORAL | Status: AC
  Administered 2011-10-28: 50 mg via ORAL
  Filled 2011-10-28: qty 2

## 2011-10-28 MED ORDER — METOPROLOL TARTRATE 50 MG PO TABS
150.0000 mg | ORAL_TABLET | Freq: Every day | ORAL | Status: DC
  Administered 2011-10-29: 150 mg via ORAL
  Filled 2011-10-28 (×2): qty 1

## 2011-10-28 MED ORDER — HYDROCODONE-ACETAMINOPHEN 5-325 MG PO TABS
1.0000 | ORAL_TABLET | Freq: Four times a day (QID) | ORAL | Status: DC | PRN
  Administered 2011-10-29 (×2): 1 via ORAL
  Administered 2011-10-30 – 2011-11-03 (×4): 2 via ORAL
  Filled 2011-10-28: qty 1
  Filled 2011-10-28: qty 2
  Filled 2011-10-28: qty 1
  Filled 2011-10-28 (×3): qty 2

## 2011-10-28 MED ORDER — METOPROLOL TARTRATE 50 MG PO TABS
50.0000 mg | ORAL_TABLET | Freq: Two times a day (BID) | ORAL | Status: DC
Start: 2011-10-28 — End: 2011-10-28
  Filled 2011-10-28 (×2): qty 1

## 2011-10-28 MED ORDER — METOPROLOL TARTRATE 50 MG PO TABS
50.0000 mg | ORAL_TABLET | Freq: Every day | ORAL | Status: DC
  Administered 2011-10-28: 50 mg via ORAL
  Filled 2011-10-28 (×2): qty 1

## 2011-10-28 MED ORDER — LABETALOL HCL 5 MG/ML IV SOLN
10.0000 mg | INTRAVENOUS | Status: DC | PRN
  Administered 2011-10-30 – 2011-11-03 (×11): 10 mg via INTRAVENOUS
  Filled 2011-10-28 (×2): qty 4

## 2011-10-28 MED ORDER — METOPROLOL TARTRATE 100 MG PO TABS
100.0000 mg | ORAL_TABLET | Freq: Every day | ORAL | Status: DC
  Filled 2011-10-28: qty 1

## 2011-10-28 MED ORDER — FUROSEMIDE 20 MG PO TABS
20.0000 mg | ORAL_TABLET | Freq: Every day | ORAL | Status: DC
  Administered 2011-10-28 – 2011-10-30 (×3): 20 mg via ORAL
  Filled 2011-10-28 (×5): qty 1

## 2011-10-28 MED ORDER — INSULIN GLARGINE 100 UNIT/ML ~~LOC~~ SOLN
22.0000 [IU] | Freq: Two times a day (BID) | SUBCUTANEOUS | Status: DC
  Administered 2011-10-28 – 2011-10-30 (×4): 22 [IU] via SUBCUTANEOUS
  Filled 2011-10-28: qty 3

## 2011-10-28 MED ORDER — CIPROFLOXACIN IN D5W 400 MG/200ML IV SOLN
400.0000 mg | Freq: Two times a day (BID) | INTRAVENOUS | Status: DC
  Administered 2011-10-28: 400 mg via INTRAVENOUS
  Filled 2011-10-28 (×2): qty 200

## 2011-10-28 MED ORDER — MORPHINE SULFATE 4 MG/ML IJ SOLN: 4.0000 mg | Freq: Once | INTRAMUSCULAR | Status: DC

## 2011-10-28 MED ORDER — HYDROMORPHONE HCL PF 1 MG/ML IJ SOLN
1.0000 mg | INTRAMUSCULAR | Status: DC | PRN
  Administered 2011-10-28 – 2011-11-03 (×28): 1 mg via INTRAVENOUS
  Filled 2011-10-28 (×28): qty 1

## 2011-10-28 NOTE — ED Notes (Signed)
RN to obtain labs with start of IV 

## 2011-10-28 NOTE — H&P (Signed)
Debra Stokes is an 36 y.o. female transferred from [facility] for evaluation due to a left labial abscess.  Pt intially presented to [facility] on 10/23/11 with c/o of left labial pain and swelling for approx 1 wk prior to presentation.  At that time, I&D was performed of the area.  Pt was discharged on Bactrim and Norco.  Pt states that 2 days ago she began having increased swelling of the upper labia into the the groin area which has increased in size and has become more painful.  She states that this AM the pain became severe therefore she represented to the ED at [facility].  She denies any fever.  She states she has been taking the antibiotics as prescribed.    Pertinent Gynecological History: Menses: regular every 30 days without intermenstrual spotting Bleeding: moderate without IMB Contraception: none-pt in same sex relationship DES exposure: unknown Blood transfusions: S/P D&C 1993 Sexually transmitted diseases: no past history Previous GYN Procedures: DNC  Last pap: WNL Date: 05/2008.  Denies hx of abn pap. OB History: G1, P0,0,1,0   Menstrual History: Patient's last menstrual period was 10/25/2011.    Past Medical History  Diagnosis Date  . Diabetes mellitus   . Asthma   . CHF (congestive heart failure)   . Chronic back pain   . Migraine headache   . Hypertension   . Morbid obesity   . Polysubstance abuse   . Sleep apnea   . Drug-seeking behavior     Past Surgical History  Procedure Date  . Cholecystectomy   . Cardiac catheterization   D&C  No family history on file.  Social History:  reports that she has been smoking Cigarettes.  She does not have any smokeless tobacco history on file. She reports that she does not drink alcohol or use illicit drugs.  Allergies: No Known Allergies   (Not in a hospital admission)  Review of Systems  Constitutional: Negative.   HENT: Negative.   Eyes: Negative.   Respiratory: Negative.   Cardiovascular: Negative.     Gastrointestinal: Negative.   Genitourinary: Negative.        Lt labial pain  Musculoskeletal: Negative.   Skin: Negative.   Neurological: Negative.   Endo/Heme/Allergies: Negative.   Psychiatric/Behavioral: Negative.     Blood pressure 176/98, pulse 98, temperature 97.2 F (36.2 C), temperature source Oral, resp. rate 18, last menstrual period 10/25/2011, SpO2 99.00%. Physical Exam  Constitutional: She is oriented to person, place, and time. She appears well-developed and well-nourished.  HENT:  Head: Normocephalic and atraumatic.  Right Ear: External ear normal.  Left Ear: External ear normal.  Nose: Nose normal.  Eyes: Conjunctivae are normal. Pupils are equal, round, and reactive to light.  Neck: Normal range of motion. Neck supple. No thyromegaly present.  Cardiovascular: Normal rate, regular rhythm and intact distal pulses.   Respiratory: Effort normal and breath sounds normal.  GI: Soft. Bowel sounds are normal.  Genitourinary:          Ext genitalia with left labia majora edematous, nonfluctuant.  No redness noted.  Site of previous I&D with sm amt yellow discharge.  Left mons pubis with 6cm long x 4cm wide area of edema and non-fluctuance.  No redness present.  No drainage noted of mons pubis area.  Labia and mons pubis exquisitely tender to palpation.  Remainder of pelvic exam deferred at present.    Musculoskeletal: Normal range of motion.  Neurological: She is alert and oriented to person, place, and  time.  Skin: Skin is warm and dry.  Psychiatric: She has a normal mood and affect. Her behavior is normal.    Results for orders placed during the hospital encounter of 10/28/11 (from the past 24 hour(s))  GLUCOSE, CAPILLARY     Status: Abnormal   Collection Time   10/28/11  1:35 PM      Component Value Range   Glucose-Capillary 234 (*) 70 - 99 mg/dL  CBC     Status: Normal   Collection Time   10/28/11  3:12 PM      Component Value Range   WBC 8.8  4.0 - 10.5 K/uL    RBC 4.16  3.87 - 5.11 MIL/uL   Hemoglobin 12.0  12.0 - 15.0 g/dL   HCT 16.1  09.6 - 04.5 %   MCV 89.2  78.0 - 100.0 fL   MCH 28.8  26.0 - 34.0 pg   MCHC 32.3  30.0 - 36.0 g/dL   RDW 40.9  81.1 - 91.4 %   Platelets 346  150 - 400 K/uL  DIFFERENTIAL     Status: Normal   Collection Time   10/28/11  3:12 PM      Component Value Range   Neutrophils Relative 74  43 - 77 %   Neutro Abs 6.6  1.7 - 7.7 K/uL   Lymphocytes Relative 19  12 - 46 %   Lymphs Abs 1.7  0.7 - 4.0 K/uL   Monocytes Relative 6  3 - 12 %   Monocytes Absolute 0.5  0.1 - 1.0 K/uL   Eosinophils Relative 1  0 - 5 %   Eosinophils Absolute 0.1  0.0 - 0.7 K/uL   Basophils Relative 0  0 - 1 %   Basophils Absolute 0.0  0.0 - 0.1 K/uL  BASIC METABOLIC PANEL     Status: Abnormal   Collection Time   10/28/11  3:12 PM      Component Value Range   Sodium 137  135 - 145 mEq/L   Potassium 3.5  3.5 - 5.1 mEq/L   Chloride 98  96 - 112 mEq/L   CO2 28  19 - 32 mEq/L   Glucose, Bld 249 (*) 70 - 99 mg/dL   BUN 8  6 - 23 mg/dL   Creatinine, Ser 7.82 (*) 0.50 - 1.10 mg/dL   Calcium 9.1  8.4 - 95.6 mg/dL   GFR calc non Af Amer 57 (*) >90 mL/min   GFR calc Af Amer 66 (*) >90 mL/min    Ct Pelvis W Contrast  10/28/2011  *RADIOLOGY REPORT*  Clinical Data:  Left labial abscess, status post I&D on 6/10  CT PELVIS WITH CONTRAST  Technique:  Multidetector CT imaging of the pelvis was performed using the standard protocol following the bolus administration of intravenous contrast.  Contrast: OMNIPAQUE IOHEXOL 300 MG/ML  SOLN  Comparison:  None.  Findings:  No drainable fluid collection or abscess.  Mottled lucencies in the left perineum/labia (for example, series 2/image 52), likely related to recent intervention and/or known infection.  Overlying subcutaneous stranding/edema, reflecting cellulitis.  Uterus and bilateral ovaries are unremarkable.  Bladder is within normal limits.  No pelvic ascites.  Visualized bowel is unremarkable.  Mild  degenerative changes at L5-S1.  IMPRESSION: No drainable fluid collection or abscess.  Mottled lucencies in the left perineum/labia, likely related to recent intervention and/or known infection.  Overlying subcutaneous stranding/edema, reflecting cellulitis.  Original Report Authenticated By: Charline Bills, M.D.  Assessment/Plan Left labial and Mons Pubis abscess Type II DM Chronic hypertension CHF  Consult obtained with Dr. Su Hilt. Pt to be admitted to [facility] for IV antibiotics. Will obtain wound culture. Pharmacy consult for cipro and metronidazole dosing CBGs fasting and 2hr postprandial.    SMITH,NONA O. 10/28/2011, 8:15 PM  Pt seen in AM and report given to Dr. Elsie Lincoln for transfer to teaching service.  Pt switched to vancomycin prior to transfer and SSI begun secondary to poorly controlled diabetes.  Dr. Penne Lash plans to consult medicine for management of pt's comorbidities.  Wound cultures were ordered but RN reports unable to obtain as of yet secondary to no obvious drainage.  Wound is tender and no visible drainage noted.  Rec sitz baths as well.

## 2011-10-28 NOTE — Progress Notes (Signed)
ANTIBIOTIC CONSULT NOTE - INITIAL  Pharmacy Consult for Cipro and Flagyl Indication: Left Labial Infection  No Known Allergies  Patient Measurements:  Vital Signs: Temp: 97 F (36.1 C) (06/15 2029) Temp src: Oral (06/15 1843) BP: 170/116 mmHg (06/15 2029) Pulse Rate: 85  (06/15 2029)  Labs:  Basename 10/28/11 1512  WBC 8.8  HGB 12.0  PLT 346  LABCREA --  CREATININE 1.21*   Estimated CrCl ~ 22mL/min  Medical History: Past Medical History  Diagnosis Date  . Diabetes mellitus   . Asthma   . CHF (congestive heart failure)   . Chronic back pain   . Migraine headache   . Hypertension   . Morbid obesity   . Polysubstance abuse   . Sleep apnea   . Drug-seeking behavior    Assessment: Pt is a 36 yo F being initiated on Cipro and Flagyl for left labial infection s/p I&D 5 days ago. CT scan negative for abscess.  Goal of Therapy:  Eradication of infection  Plan:  1. Cipro 400mg  IV q12h 2. Flagyl 500mg  IV q8h 3. Will continue to follow patient clinically.  Lenore Manner [place] 10/28/2011,9:36 PM

## 2011-10-28 NOTE — Progress Notes (Signed)
To Women's Unit via w/c

## 2011-10-28 NOTE — MAU Note (Signed)
Report called to Rogelio Seen RN on Women's unit.

## 2011-10-28 NOTE — ED Notes (Signed)
Left side vaginal wall swelling noted and tender to touch

## 2011-10-28 NOTE — ED Notes (Signed)
Pt c/o left side vaginal pain abscess.  Pt presented to ED on the 10th for I/D to abscess.  Pt states "gauze fell out next day and since then I have been wearing a pad and its been a lot of yellow and bloody drainage"  Pt presents with worsening pain.  Pt denies chills n/v

## 2011-10-28 NOTE — ED Notes (Signed)
Pt back from XR 

## 2011-10-28 NOTE — ED Notes (Signed)
Report called to [facility] and [facility].

## 2011-10-28 NOTE — ED Notes (Signed)
Patient transported to X-ray 

## 2011-10-28 NOTE — MAU Note (Signed)
Pt transferred from [facility] for labial abcess.

## 2011-10-28 NOTE — MAU Note (Signed)
Pt talking on phone

## 2011-10-28 NOTE — ED Provider Notes (Signed)
History     CSN: [id number]  Arrival date & time 10/28/11  1215   First MD Initiated Contact with Patient 10/28/11 1255      Chief Complaint  Patient presents with  . Abscess    (Consider location/radiation/quality/duration/timing/severity/associated sxs/prior treatment) HPI History from patient. 36 year old female with past medical history of diabetes and morbid obesity who presents with an abscess to the left labia. She states that this came up approximately a week ago. She was seen in the ED for the same 5 days ago, and the area was incised and drained. She states that it was packed with iodoform gauze which fell out the next day. She has had increased pain to the area and continued yellow/bloody drainage from the wound. Pain described as sharp, severe, constant, and worsens with sitting or any pressure on the area. She denies any fever or chills. She denies abdominal pain, nausea, vomiting, changes in appetite. She does have a history of abscesses to the axilla but has not had these in the groin previously. She's not currently followed by a GYN.   Past Medical History  Diagnosis Date  . Diabetes mellitus   . Asthma   . CHF (congestive heart failure)   . Chronic back pain   . Migraine headache   . Hypertension   . Morbid obesity   . Polysubstance abuse   . Sleep apnea   . Drug-seeking behavior     Past Surgical History  Procedure Date  . Cholecystectomy   . Cardiac catheterization     No family history on file.  History  Substance Use Topics  . Smoking status: Current Everyday Smoker    Types: Cigarettes  . Smokeless tobacco: Not on file  . Alcohol Use: No    OB History    Grav Para Term Preterm Abortions TAB SAB Ect Mult Living                  Review of Systems  Constitutional: Negative for fever, chills, activity change and appetite change.  Gastrointestinal: Negative for nausea, vomiting and abdominal pain.  Genitourinary: Positive for pelvic pain.  Negative for dysuria, flank pain, vaginal bleeding, vaginal discharge and vaginal pain.  Musculoskeletal: Negative for myalgias.  Skin: Positive for wound. Negative for color change.  Neurological: Negative for dizziness and weakness.    Allergies  Review of patient's allergies indicates no known allergies.  Home Medications   Current Outpatient Rx  Name Route Sig Dispense Refill  . FUROSEMIDE 20 MG PO TABS Oral Take 20 mg by mouth daily.      Marland Kitchen HYDROCHLOROTHIAZIDE 25 MG PO TABS Oral Take 25 mg by mouth daily.      Marland Kitchen HYDROCODONE-ACETAMINOPHEN 5-325 MG PO TABS Oral Take 1-2 tablets by mouth every 6 (six) hours as needed for pain. 16 tablet 0  . INSULIN GLARGINE 100 UNIT/ML [place] SOLN Subcutaneous Inject 22 Units into the skin 2 (two) times daily.     Marland Kitchen LORAZEPAM 2 MG PO TABS Oral Take 2 mg by mouth every 6 (six) hours as needed. For anxiety.     Marland Kitchen METOPROLOL TARTRATE 100 MG PO TABS Oral Take 100 mg by mouth every morning.      Marland Kitchen METOPROLOL TARTRATE 50 MG PO TABS Oral Take 50 mg by mouth 2 (two) times daily.      Marland Kitchen NAPROXEN SODIUM 220 MG PO TABS Oral Take 220 mg by mouth 2 (two) times daily with a meal.    . PHENYLEPH-DOXYLAMINE-DM-APAP  5-6.25-10-325 MG/15ML PO LIQD Oral Take 30 mLs by mouth every 6 (six) hours as needed. For cough and cold    . SULFAMETHOXAZOLE-TRIMETHOPRIM 800-160 MG PO TABS Oral Take 1 tablet by mouth 2 (two) times daily. 14 tablet 0    BP 171/115  Pulse 103  Temp 98.3 F (36.8 C) (Oral)  Resp 18  SpO2 100%  LMP 09/29/2011  Physical Exam  Nursing note and vitals reviewed. Constitutional: She appears well-developed and well-nourished. She appears distressed.       Pt uncomfortable appearing  HENT:  Head: Normocephalic and atraumatic.  Eyes:       Normal appearance  Neck: Normal range of motion.  Cardiovascular: Normal rate, regular rhythm and normal heart sounds.   Pulmonary/Chest: Effort normal and breath sounds normal. She exhibits no tenderness.    Abdominal: Soft. Bowel sounds are normal. There is no tenderness. There is no rebound and no guarding.  Genitourinary:       Female NT present during exam Large area of induration on left labia measuring approx 5 cm with confluent area onto mons measuring approx 8 cm; purulent drainage from old incision; no overlying cellulitis; pt significantly uncomfortable during palpation of the area  Musculoskeletal: Normal range of motion.  Neurological: She is alert.  Skin: Skin is warm and dry. She is not diaphoretic.  Psychiatric: She has a normal mood and affect.    ED Course  Procedures (including critical care time)  Labs Reviewed  GLUCOSE, CAPILLARY - Abnormal; Notable for the following:    Glucose-Capillary 234 (*)     All other components within normal limits  BASIC METABOLIC PANEL - Abnormal; Notable for the following:    Glucose, Bld 249 (*)     Creatinine, Ser 1.21 (*)     GFR calc non Af Amer 57 (*)     GFR calc Af Amer 66 (*)     All other components within normal limits  CBC  DIFFERENTIAL   No results found.   1. Abscess of left genital labia       MDM  2:22 PM Discussed with Dr. Rubin Payor - area was examined. We will consult GYN given the significant size of the area.  2:25 PM Discussed with GYN, Dr. Su Hilt. Recommends sending [facility] evaluation and treatment, with CT pelvis prior to transfer. Discussed this plan with pt and she is agreeable.  4:36 PM Pt's CT has been completed. Pt to be transferred to [facility] for further evaluation. She has remained stable while in ED.      Regis Wiland, PA-C 10/28/11 1637

## 2011-10-29 ENCOUNTER — Telehealth: Payer: Self-pay | Admitting: Obstetrics and Gynecology

## 2011-10-29 ENCOUNTER — Encounter (HOSPITAL_COMMUNITY): Payer: Self-pay

## 2011-10-29 DIAGNOSIS — F1910 Other psychoactive substance abuse, uncomplicated: Secondary | ICD-10-CM

## 2011-10-29 DIAGNOSIS — E118 Type 2 diabetes mellitus with unspecified complications: Secondary | ICD-10-CM

## 2011-10-29 DIAGNOSIS — N764 Abscess of vulva: Secondary | ICD-10-CM | POA: Diagnosis present

## 2011-10-29 DIAGNOSIS — I5032 Chronic diastolic (congestive) heart failure: Secondary | ICD-10-CM

## 2011-10-29 LAB — URINE MICROSCOPIC-ADD ON

## 2011-10-29 LAB — CBC
HCT: 37.4 % (ref 36.0–46.0)
Hemoglobin: 11.5 g/dL — ABNORMAL LOW (ref 12.0–15.0)
MCHC: 30.7 g/dL (ref 30.0–36.0)
RDW: 15.2 % (ref 11.5–15.5)
WBC: 9.2 10*3/uL (ref 4.0–10.5)

## 2011-10-29 LAB — DIFFERENTIAL
Basophils Absolute: 0 10*3/uL (ref 0.0–0.1)
Basophils Relative: 0 % (ref 0–1)
Lymphocytes Relative: 23 % (ref 12–46)
Monocytes Relative: 6 % (ref 3–12)
Neutro Abs: 6.4 10*3/uL (ref 1.7–7.7)
Neutrophils Relative %: 69 % (ref 43–77)

## 2011-10-29 LAB — URINALYSIS, ROUTINE W REFLEX MICROSCOPIC
Glucose, UA: 100 mg/dL — AB
Protein, ur: 30 mg/dL — AB
Specific Gravity, Urine: <1.005 — ABNORMAL LOW (ref 1.005–1.030)

## 2011-10-29 LAB — GLUCOSE, CAPILLARY
Glucose-Capillary: 214 mg/dL — ABNORMAL HIGH (ref 70–99)
Glucose-Capillary: 176 mg/dL — ABNORMAL HIGH (ref 70–99)

## 2011-10-29 LAB — RAPID URINE DRUG SCREEN, HOSP PERFORMED
Amphetamines: NOT DETECTED
Opiates: NOT DETECTED
Tetrahydrocannabinol: NOT DETECTED

## 2011-10-29 LAB — PREGNANCY, URINE: Preg Test, Ur: NEGATIVE

## 2011-10-29 MED ORDER — METRONIDAZOLE 500 MG PO TABS
2000.0000 mg | ORAL_TABLET | Freq: Once | ORAL | Status: AC
  Administered 2011-10-29: 2000 mg via ORAL
  Filled 2011-10-29: qty 4

## 2011-10-29 MED ORDER — INSULIN ASPART 100 UNIT/ML ~~LOC~~ SOLN
0.0000 [IU] | Freq: Every day | SUBCUTANEOUS | Status: DC
  Administered 2011-10-30: 3 [IU] via SUBCUTANEOUS

## 2011-10-29 MED ORDER — VANCOMYCIN HCL 1000 MG IV SOLR
1500.0000 mg | Freq: Two times a day (BID) | INTRAVENOUS | Status: DC
  Administered 2011-10-29: 1500 mg via INTRAVENOUS
  Filled 2011-10-29 (×2): qty 1500

## 2011-10-29 MED ORDER — VANCOMYCIN HCL 1000 MG IV SOLR
2000.0000 mg | Freq: Once | INTRAVENOUS | Status: AC
  Administered 2011-10-29: 2000 mg via INTRAVENOUS
  Filled 2011-10-29: qty 2000

## 2011-10-29 MED ORDER — SODIUM CHLORIDE 0.9 % IV SOLN
INTRAVENOUS | Status: DC
  Administered 2011-10-29 – 2011-11-03 (×9): via INTRAVENOUS

## 2011-10-29 MED ORDER — LIDOCAINE HCL (CARDIAC) 20 MG/ML IV SOLN
INTRAVENOUS | Status: AC
  Filled 2011-10-29: qty 5

## 2011-10-29 MED ORDER — LABETALOL HCL 100 MG PO TABS
100.0000 mg | ORAL_TABLET | Freq: Two times a day (BID) | ORAL | Status: DC
  Administered 2011-10-29 – 2011-10-30 (×2): 100 mg via ORAL
  Filled 2011-10-29 (×4): qty 1

## 2011-10-29 MED ORDER — INSULIN ASPART 100 UNIT/ML ~~LOC~~ SOLN
0.0000 [IU] | Freq: Three times a day (TID) | SUBCUTANEOUS | Status: DC
  Administered 2011-10-29: 11 [IU] via SUBCUTANEOUS
  Administered 2011-10-29: 7 [IU] via SUBCUTANEOUS
  Administered 2011-10-29: 4 [IU] via SUBCUTANEOUS
  Administered 2011-10-30: 7 [IU] via SUBCUTANEOUS
  Administered 2011-10-30: 4 [IU] via SUBCUTANEOUS
  Administered 2011-10-31: 10 [IU] via SUBCUTANEOUS
  Administered 2011-10-31 (×2): 4 [IU] via SUBCUTANEOUS
  Administered 2011-11-01 – 2011-11-03 (×2): 3 [IU] via SUBCUTANEOUS

## 2011-10-29 NOTE — Telephone Encounter (Signed)
Error

## 2011-10-29 NOTE — Progress Notes (Addendum)
Accepted transfer from Dr. Alease Medina  Subjective: Patient reports pain on left labia.  Pain has not improved since admission.  Pt has refused warm compresses by RN.  Pt denies fever, rigors, chills, abdominal pain, chest pain, SOB.  Objective: I have reviewed patient's vital signs, intake and output, medications, labs and radiology results.  General: alert, cooperative and no distress Resp: clear to auscultation bilaterally Cardio: regular rate and rhythm GI: soft, non-tender; bowel sounds normal; no masses,  no organomegaly Extremities: Homans sign is negative, no sign of DVT Vaginal Bleeding: none labia is swollen and prio I & D site is draining a small amount of purulent material.  Left mons is also swollen and markedly tender.  No fluctuant area for I & D at this time.  No evidence of necrotizing fascitits.    CBC    Component Value Date/Time   WBC 9.2 10/29/2011 0518   RBC 4.09 10/29/2011 0518   HGB 11.5* 10/29/2011 0518   HCT 37.4 10/29/2011 0518   PLT 312 10/29/2011 0518   MCV 91.4 10/29/2011 0518   MCH 28.1 10/29/2011 0518   MCHC 30.7 10/29/2011 0518   RDW 15.2 10/29/2011 0518   LYMPHSABS 2.2 10/29/2011 0518   MONOABS 0.6 10/29/2011 0518   EOSABS 0.1 10/29/2011 0518   BASOSABS 0.0 10/29/2011 0518    Assessment/Plan: 36 year old female with left labial abscess not responding to outpatient therapy. Change Abx to Vancomycin to cover MRSA. Medicine consult to help with glycemic control, CHF, HTN Trichomoniasis in urine--Flagyl 2g x1 po.   LOS: 1 day    Watson Robarge H. 10/29/2011, 9:39 AM

## 2011-10-29 NOTE — Progress Notes (Signed)
ANTIBIOTIC CONSULT NOTE - INITIAL  Pharmacy Consult for Vancomycin Indication: left labial pain and swelling  No Known Allergies  Patient Measurements: Height: 5\' 6"  (167.6 cm) Weight: 348 lb 12 oz (158.192 kg) IBW/kg (Calculated) : 59.3  Adjusted Body Weight: 89kg  Vital Signs: Temp: 98.2 F (36.8 C) (06/16 0538) Temp src: Oral (06/16 0538) BP: 132/92 mmHg (06/16 0538) Pulse Rate: 77  (06/16 0538)  Labs:  Basename 10/29/11 0518 10/28/11 1512  WBC 9.2 8.8  HGB 11.5* 12.0  PLT 312 346  LABCREA -- --  CREATININE -- 1.21*  CRCLEARANCE -- --    Microbiology: No results found for this or any previous visit (from the past 720 hour(s)).  Medications:   Patient was on Bactrim po after I&D of site about 1 week ago. She reports that she did take her medications as prescribed.  She [facility] with for increasing pain and swelling of left labia and was started on IV Cipro and Flagyl.    Assessment: No improvement despite IV antibiotics.  No fever or chills noted.  Will change antibiotics to Vancomycin to cover MRSA.    Goal of Therapy:  Trough of 10-15 mg/L.  Plan: Vancomyin 2000 mg load followed by a maintenance dose of 1500 mg q 12 hours.  Will check trough at steady state and Scr with morning labs on 10-30-11.  Pt with SCr of 1.21mg /dL.  Will follow very closely and encourage po intake to maintain adequate hydration.     Berlin Hun D 10/29/2011,11:19 AM

## 2011-10-29 NOTE — Consult Note (Signed)
[facility] Medical Consultation  Debra Stokes JXB:[id number] DOB: 01-09-1976 DOA: 10/28/2011 PCP: Lonell Face, MD   Requesting physician: Dr. Penne Lash, K.  Date of consultation: 10/29/2011  Reason for consultation: diabetes  Chief Complaint: CBG management  HPI:  36 year old female with quite an extensive medical history considering her age including but not limited to morbid obesity, uncontrolled DM, substance abuse (cocaine, UDS positive for cocaine in 2012)  was transferred from [facility] to [facility] for vulvar abscess requiring IV antibiotics and pain management. Internal medicine consult requested for management of diabetes. Patient denies chest pain, shortness of breath, nausea or vomiting. No abdominal pain, no blood in stool or urine. No fever or chills, no lightheadedness no blurry vision.  Review of Systems:  Review of Systems  Constitutional: Negative for fever, chills and malaise/fatigue.  Eyes: Negative for blurred vision and double vision.  Respiratory: Negative for cough and hemoptysis.   Cardiovascular: Negative for chest pain and palpitations.  Gastrointestinal: Positive for vomiting. Negative for nausea and abdominal pain.  Genitourinary: Negative for dysuria, urgency and frequency.  Musculoskeletal: Negative for myalgias.  Neurological: Negative for dizziness and headaches.  Endo/Heme/Allergies: Negative for polydipsia.  Psychiatric/Behavioral: Negative for depression.     Past Medical History  Diagnosis Date  . Diabetes mellitus   . Asthma   . CHF (congestive heart failure)   . Chronic back pain   . Migraine headache   . Hypertension   . Morbid obesity   . Polysubstance abuse   . Sleep apnea   . Drug-seeking behavior    Past Surgical History  Procedure Date  . Cholecystectomy   . Cardiac catheterization    Social History:  reports that she has been smoking Cigarettes.  She does not have any smokeless tobacco history on file. She reports that she  does not drink alcohol or use illicit drugs.  No Known Allergies No family history on file.  Prior to Admission medications   Medication Sig Start Date End Date Taking? Authorizing Provider  furosemide (LASIX) 20 MG tablet Take 20 mg by mouth daily.     Yes Historical Provider, MD  hydrochlorothiazide (HYDRODIURIL) 25 MG tablet Take 25 mg by mouth daily.     Yes Historical Provider, MD  HYDROcodone-acetaminophen (NORCO) 5-325 MG per tablet Take 1-2 tablets by mouth every 6 (six) hours as needed for pain. 10/23/11 11/02/11 Yes Celene Kras, MD  insulin glargine (LANTUS) 100 UNIT/ML injection Inject 22 Units into the skin 2 (two) times daily.    Yes Historical Provider, MD  LORazepam (ATIVAN) 2 MG tablet Take 2 mg by mouth every 6 (six) hours as needed. For anxiety.    Yes Historical Provider, MD  metoprolol (LOPRESSOR) 100 MG tablet Take 100 mg by mouth every morning.     Yes Historical Provider, MD  metoprolol (LOPRESSOR) 50 MG tablet Take 50 mg by mouth 2 (two) times daily.     Yes Historical Provider, MD  naproxen sodium (ANAPROX) 220 MG tablet Take 220 mg by mouth 2 (two) times daily with a meal.   Yes Historical Provider, MD  Phenyleph-Doxylamine-DM-APAP (TYLENOL COLD MULTI-SYMPTOM) 5-6.25-10-325 MG/15ML LIQD Take 30 mLs by mouth every 6 (six) hours as needed. For cough and cold   Yes Historical Provider, MD  sulfamethoxazole-trimethoprim (SEPTRA DS) 800-160 MG per tablet Take 1 tablet by mouth 2 (two) times daily. 10/23/11 11/02/11 Yes Celene Kras, MD   Physical Exam: Blood pressure 132/92, pulse 77, temperature 98.2 F (36.8 C), temperature source  Oral, resp. rate 22, height 5\' 6"  (1.676 m), weight 158.192 kg (348 lb 12 oz), last menstrual period 10/25/2011, SpO2 99.00%. Filed Vitals:   10/28/11 2221 10/28/11 2230 10/29/11 0138 10/29/11 0538  BP:  143/99 120/82 132/92  Pulse:  88 76 77  Temp:  98.2 F (36.8 C) 98.1 F (36.7 C) 98.2 F (36.8 C)  TempSrc:  Oral Oral Oral  Resp:  19 20 22    Height: 5\' 6"  (1.676 m)     Weight: 158.192 kg (348 lb 12 oz)     SpO2:  98% 99%    BP 132/94  Pulse 80  Temp 98.7 F (37.1 C) (Oral)  Resp 20  Ht 5\' 6"  (1.676 m)  Wt 158.192 kg (348 lb 12 oz)  BMI 56.29 kg/m2  SpO2 100%  LMP 10/25/2011  General Appearance:    Alert, cooperative, no distress, appears stated age  Head:    Normocephalic, without obvious abnormality, atraumatic  Eyes:    PERRL, conjunctiva/corneas clear, EOM's intact, fundi    benign, both eyes  Ears:    Normal TM's and external ear canals, both ears  Nose:   Nares normal, septum midline, mucosa normal, no drainage    or sinus tenderness  Throat:   Lips, mucosa, and tongue normal; teeth and gums normal  Neck:   Supple, symmetrical, trachea midline, no adenopathy;    thyroid:  no enlargement/tenderness/nodules; no carotid   bruit or JVD  Back:     Symmetric, no curvature, ROM normal, no CVA tenderness  Lungs:     Clear to auscultation bilaterally, respirations unlabored  Chest Wall:    No tenderness or deformity   Heart:    Regular rate and rhythm, S1 and S2 normal, no murmur, rub   or gallop  Breast Exam:    No tenderness, masses, or nipple abnormality  Abdomen:     Soft, non-tender, bowel sounds active all four quadrants,    no masses, no organomegaly  Extremities:   Extremities normal, atraumatic, no cyanosis or edema  Pulses:   2+ and symmetric all extremities  Skin:   Skin color, texture, turgor normal, no rashes or lesions  Lymph nodes:   Cervical, supraclavicular, and axillary nodes normal  Neurologic:   CNII-XII intact, normal strength, sensation and reflexes    throughout     Labs on Admission:  Basic Metabolic Panel:  Lab 10/28/11 1610  NA 137  K 3.5  CL 98  CO2 28  GLUCOSE 249*  BUN 8  CREATININE 1.21*  CALCIUM 9.1  MG --  PHOS --   Liver Function Tests: No results found for this basename: AST:5,ALT:5,ALKPHOS:5,BILITOT:5,PROT:5,ALBUMIN:5 in the last 168 hours No results found for  this basename: LIPASE:5,AMYLASE:5 in the last 168 hours No results found for this basename: AMMONIA:5 in the last 168 hours CBC:  Lab 10/29/11 0518 10/28/11 1512  WBC 9.2 8.8  NEUTROABS 6.4 6.6  HGB 11.5* 12.0  HCT 37.4 37.1  MCV 91.4 89.2  PLT 312 346   Cardiac Enzymes: No results found for this basename: CKTOTAL:5,CKMB:5,CKMBINDEX:5,TROPONINI:5 in the last 168 hours BNP: No components found with this basename: POCBNP:5 CBG:  Lab 10/29/11 1011 10/29/11 0342 10/28/11 2116 10/28/11 1335 10/23/11 1226  GLUCAP 273* 214* 306* 234* 217*    Radiological Exams on Admission: Ct Pelvis W Contrast  10/28/2011  *RADIOLOGY REPORT*  Clinical Data:  Left labial abscess, status post I&D on 6/10  CT PELVIS WITH CONTRAST  Technique:  Multidetector CT imaging of  the pelvis was performed using the standard protocol following the bolus administration of intravenous contrast.  Contrast: OMNIPAQUE IOHEXOL 300 MG/ML  SOLN  Comparison:  None.  Findings:  No drainable fluid collection or abscess.  Mottled lucencies in the left perineum/labia (for example, series 2/image 52), likely related to recent intervention and/or known infection.  Overlying subcutaneous stranding/edema, reflecting cellulitis.  Uterus and bilateral ovaries are unremarkable.  Bladder is within normal limits.  No pelvic ascites.  Visualized bowel is unremarkable.  Mild degenerative changes at L5-S1.  IMPRESSION: No drainable fluid collection or abscess.  Mottled lucencies in the left perineum/labia, likely related to recent intervention and/or known infection.  Overlying subcutaneous stranding/edema, reflecting cellulitis.  Original Report Authenticated By: Charline Bills, M.D.      Impression/Recommendations  Diabetes Mellitus with complications, uncontrolled  Awaiting HgA1c  CBG: 176, 273, 214  Please note that A1c in 2012 10.8  At this time I would continue glycemic control through sliding scale and will see what her  insulin requirement is in next 24 hours  Diabetic coordinator education  Nutrition consult  I will followup again tomorrow. Please contact me if I can be of assistance in the meanwhile. Thank you for this consultation.  Manson Passey, MD  [facility] Pager [phone]  Time spent 35 minutes  If 7PM-7AM, please contact night-coverage [email or web address] Password [facility] 10/29/2011, 12:23 PM

## 2011-10-29 NOTE — Progress Notes (Signed)
Attempted to culture wound after patient showered.  No drainage present to swab.

## 2011-10-29 NOTE — ED Provider Notes (Signed)
Medical screening examination/treatment/procedure(s) were conducted as a shared visit with non-physician practitioner(s) and myself.  I personally evaluated the patient during the encounter. Large labial/pubic abscess. She has been antibiotics. Has previously recently been drained. Patient will be transferred to [facility] after discussion with the gynecologist  Juliet Rude. Rubin Payor, MD 10/29/11 [phone]

## 2011-10-30 DIAGNOSIS — E118 Type 2 diabetes mellitus with unspecified complications: Secondary | ICD-10-CM

## 2011-10-30 DIAGNOSIS — I5032 Chronic diastolic (congestive) heart failure: Secondary | ICD-10-CM

## 2011-10-30 DIAGNOSIS — F1910 Other psychoactive substance abuse, uncomplicated: Secondary | ICD-10-CM

## 2011-10-30 LAB — BASIC METABOLIC PANEL
BUN: 22 mg/dL (ref 6–23)
CO2: 29 mEq/L (ref 19–32)
Chloride: 97 mEq/L (ref 96–112)
Creatinine, Ser: 1.66 mg/dL — ABNORMAL HIGH (ref 0.50–1.10)
Glucose, Bld: 182 mg/dL — ABNORMAL HIGH (ref 70–99)

## 2011-10-30 LAB — GLUCOSE, CAPILLARY
Glucose-Capillary: 176 mg/dL — ABNORMAL HIGH (ref 70–99)
Glucose-Capillary: 220 mg/dL — ABNORMAL HIGH (ref 70–99)
Glucose-Capillary: 263 mg/dL — ABNORMAL HIGH (ref 70–99)

## 2011-10-30 LAB — MRSA PCR SCREENING: MRSA by PCR: NEGATIVE

## 2011-10-30 MED ORDER — LABETALOL HCL 200 MG PO TABS
200.0000 mg | ORAL_TABLET | Freq: Two times a day (BID) | ORAL | Status: DC
  Administered 2011-10-30 – 2011-11-02 (×7): 200 mg via ORAL
  Filled 2011-10-30 (×8): qty 1

## 2011-10-30 MED ORDER — INSULIN GLARGINE 100 UNIT/ML ~~LOC~~ SOLN
30.0000 [IU] | Freq: Two times a day (BID) | SUBCUTANEOUS | Status: DC
  Administered 2011-10-30 – 2011-10-31 (×2): 30 [IU] via SUBCUTANEOUS

## 2011-10-30 MED ORDER — VANCOMYCIN HCL 1000 MG IV SOLR
1750.0000 mg | INTRAVENOUS | Status: DC
  Administered 2011-10-30 – 2011-11-03 (×5): 1750 mg via INTRAVENOUS
  Filled 2011-10-30 (×5): qty 1750

## 2011-10-30 NOTE — Progress Notes (Signed)
Patient ID: Debra Stokes, female   DOB: 17-Nov-1975, 36 y.o.   MRN: [id number] [facility] Consult Follow-up Note  Kimbery Harwood WUJ:[id number] DOB: April 07, 1976 DOA: 10/28/2011 PCP: Lonell Face, MD Requesting physician: Dr. Penne Lash  Impression/Recommendations  Diabetes Mellitus with complications, uncontrolled  HgA1c 10.8 Appreciate diabetic education and consult Please continue sliding scale regimen along with lantus per diabetic coordinator order Appreciate nutrition consult Please note positive cocaine on urine drug screen  Acute on chronic kidney disease, stage 3 - creatinine 1.66 - can continue monitoring renal function although per [facility] record patient has had elevated creatinine values - if it continues to trend up significantly I would recommend nephrology consult  HTN - continue Hctz and labetalol; may increase labetalol to 200 mg BID - may add hydralazine for better BP control - avoid metoprolol due to cocaine abuse  * will continue to follow with you  Manson Passey, MD  [facility] Pager [phone]  If 7PM-7AM, please contact night-coverage [email or web address] password [email or web address] 10/30/2011, 9:01 PM   LOS: 2 days   Interim History: Patient admitted for vulvar abscess, internal medicine consult requested for management of diabetes and other comorbidities  Subjective: No acute events overnight  Objective: Filed Vitals:   10/30/11 0542 10/30/11 1158 10/30/11 1830 10/30/11 2036  BP: 145/98 150/99 165/122 164/113  Pulse: 71 78 85 84  Temp: 97.4 F (36.3 C) 98.4 F (36.9 C) 97.7 F (36.5 C) 98 F (36.7 C)  TempSrc: Oral Oral Oral Oral  Resp: 22 20 23 22   Height:      Weight:      SpO2: 96% 97% 94%      Intake/Output Summary (Last 24 hours) at 10/30/11 2101 Last data filed at 10/30/11 1831  Gross per 24 hour  Intake    520 ml  Output   1110 ml  Net   -590 ml    Exam: General: no acute distress CVS: S1, S2, regular rate and rhythm ABD:  non tender, non distended, (+) BS Ext: pulses palpable bilaterally, no cyanosis Resp: clear to auscultation bilaterally, no wheezing Neuro: no focal neuro deficits  Data Reviewed: Basic Metabolic Panel:  Lab 10/30/11 8657 10/28/11 1512  NA 136 137  K 3.8 3.5  CL 97 98  CO2 29 28  GLUCOSE 182* 249*  BUN 22 8  CREATININE 1.66* 1.21*  CALCIUM 9.3 9.1   CBC:  Lab 10/29/11 0518 10/28/11 1512  WBC 9.2 8.8  HGB 11.5* 12.0  HCT 37.4 37.1  MCV 91.4 89.2  PLT 312 346   CBG:  Lab 10/30/11 2032 10/30/11 1516 10/30/11 1038 10/30/11 0533 10/29/11 2125  GLUCAP 263* 220* 204* 176* 203*    Recent Results (from the past 240 hour(s))  MRSA PCR SCREENING     Status: Normal   Collection Time   10/30/11 11:46 AM      Component Value Range Status Comment   MRSA by PCR NEGATIVE  NEGATIVE Final      Studies: Ct Angio Chest W/cm &/or Wo Cm 10/08/2011  **ADDENDUM** CREATED: 10/08/2011 15:51:38  The third paragraph of the impression of the chest CT should read the following:  Suspected mass extending from the right thoracic inlet into the  right paratracheal region. This may represent an aberrant  muscle or atrophy of the contralateral musculature. CT neck is  recommended to further evaluate as this abnormality is partially  imaged  **END ADDENDUM** SIGNED BY: Marlowe Aschoff. Hoss, M.D.   10/08/2011  IMPRESSION: Study is markedly limited  for the evaluation of the pulmonary arterial tree.  No obvious pulmonary thromboembolism.  Mild mediastinal adenopathy as described.  Followup study in 3-6 months is recommended to ensure stability.  Considerations include inflammatory and neoplastic etiology.  Suspected mass extending from the right thoracic inlet into the right paratracheal region.  This may represent and eight very muscle or atrophy of the contralateral musculature.  CT neck is recommended to further evaluate as this abnormality is partially imaged.  CT ABDOMEN AND PELVIS WITH CONTRAST  Technique:   Multidetector CT imaging of the abdomen and pelvis was performed using the standard protocol following bolus administration of intravenous contrast.  Contrast: OMNIPAQUE IOHEXOL 300 MG/ML  SOLN  Comparison:   None.  Findings:  Post cholecystectomy.  Liver, spleen, pancreas, adrenal glands, kidneys are within normal limits.  Normal appendix.  No free fluid.  Bladder, uterus, and adnexa are unremarkable.  No abnormal adenopathy in the abdomen or pelvis.  No acute bony deformity.  IMPRESSION: No acute intra-abdominal pathology.   Ct Pelvis W Contrast 10/28/2011 IMPRESSION: No drainable fluid collection or abscess.  Mottled lucencies in the left perineum/labia, likely related to recent intervention and/or known infection.  Overlying subcutaneous stranding/edema, reflecting cellulitis.    Ct Abdomen Pelvis W Contrast 10/08/2011  **ADDENDUM** CREATED: 10/08/2011 15:51:38  The third paragraph of the impression of the chest CT should read the following:  Suspected mass extending from the right thoracic inlet into the  right paratracheal region. This may represent an aberrant  muscle or atrophy of the contralateral musculature. CT neck is  recommended to further evaluate as this abnormality is partially  imaged  **END ADDENDUM** SIGNED BY: Marlowe Aschoff. Hoss, M.D.   10/08/2011  *RADIOLOGY REPORT*  Clinical Data: Epigastric pain  CT ANGIOGRAPHY CHEST,CT ABDOMEN AND PELVIS WITH CONTRAST  Technique:  Multidetector CT imaging of the chest using the standard protocol during bolus administration of intravenous contrast. Multiplanar reconstructed images including MIPs were obtained and reviewed to evaluate the vascular anatomy.,Technique: Mul  Contrast: OMNIPAQUE IOHEXOL 300 MG/ML  SOLN  Comparison: 03/08/2011  Findings: Motion artifacts severely degrades the exam.  No obvious filling defects in the pulmonary arterial tree to suggest acute pulmonary thromboembolism.  Emboli beyond the central vasculature could easily be  missed by this study.  There is a right paratracheal soft tissue density extending from the thoracic inlet inferiorly it is asymmetrical with that of the left side of the thoracic inlet.  It is inferior and posterior to the right lobe of the thyroid gland.  On image 4, it is 2.1 x 2.9 cm.  13 mm short axis diameter prevascular node on image 31.  Soft tissue densities in the hilar regions may represent borderline hilar adenopathy.  Scattered ground-glass opacities are seen throughout the lungs low lung volumes.  No acute bony deformity.  IMPRESSION: Study is markedly limited for the evaluation of the pulmonary arterial tree.  No obvious pulmonary thromboembolism.  Mild mediastinal adenopathy as described.  Followup study in 3-6 months is recommended to ensure stability.  Considerations include inflammatory and neoplastic etiology.  Suspected mass extending from the right thoracic inlet into the right paratracheal region.  This may represent and eight very muscle or atrophy of the contralateral musculature.  CT neck is recommended to further evaluate as this abnormality is partially imaged.  CT ABDOMEN AND PELVIS WITH CONTRAST  Technique:  Multidetector CT imaging of the abdomen and pelvis was performed using the standard protocol following bolus administration of intravenous  contrast.  Contrast: OMNIPAQUE IOHEXOL 300 MG/ML  SOLN  Comparison:   None.  Findings:  Post cholecystectomy.  Liver, spleen, pancreas, adrenal glands, kidneys are within normal limits.  Normal appendix.  No free fluid.  Bladder, uterus, and adnexa are unremarkable.  No abnormal adenopathy in the abdomen or pelvis.  No acute bony deformity.  IMPRESSION: No acute intra-abdominal pathology. Original Report Authenticated By: Donavan Burnet, M.D.    Scheduled Meds:   . hydrochlorothiazide  25 mg Oral Daily  . insulin aspart  0-20 Units Subcutaneous TID WC  . insulin aspart  0-5 Units Subcutaneous QHS  . insulin glargine  30 Units  Subcutaneous BID  . labetalol  100 mg Oral BID  . vancomycin  1,750 mg Intravenous Q24H  . DISCONTD: furosemide  20 mg Oral Daily  . DISCONTD: insulin glargine  22 Units Subcutaneous BID  . DISCONTD: vancomycin  1,500 mg Intravenous Q12H   Continuous Infusions:   . sodium chloride 125 mL/hr at 10/30/11 0541

## 2011-10-30 NOTE — Progress Notes (Signed)
Subjective: Patient reports decreased pain in vulva.  No fevers, chills.  No other complaints  Objective: I have reviewed patient's vital signs, medications and labs.  General: alert, cooperative and no distress GI: soft, NT Extremities: Homans sign is negative, no sign of DVT left vulva still indurated.  No drainage (culture obtained this morning; ordered 24 hours ago); left mons less indurated and less tender.  No fluctuant area.  Pt has refused hot packs still   Assessment/Plan: 36 yo female with multiple medical problems and vulvar cellulitis 1-CHF, HTN, DM, and renal insufficiency [facility].  1.   Diabetes education today which could lead to change in insulin regimen.  Pt is not on any short acting insulin.  Pt needs simple regimen as compliance is thought to be a problems.  (hgb A1c=10.8) 2.  CHTN--stopped Metoprolol--pt was on 2 beta blockers and has positive drug screen for cocaine on admission. 3  Renal insufficiency--pt has had elevated creatinine for months so unlikely to e acute due to CT.  IM consult particularly following this problem since patient is on Vancomycin. 2-Vulvar cellulitis--improved on vancomycin.  Hot pack applied. 3-Encourage ambulation 4-BMP still pending at time of note.   LOS: 2 days    Debra Stokes H. 10/30/2011, 6:29 AM

## 2011-10-30 NOTE — Progress Notes (Signed)
ANTIBIOTIC CONSULT NOTE - FOLLOW UP  Pharmacy Consult for Vancomycin Indication: Left labial infection  No Known Allergies  Patient Measurements: Height: 5\' 6"  (167.6 cm) Weight: 348 lb 12 oz (158.192 kg) IBW/kg (Calculated) : 59.3  Adjusted Body Weight: 89 kg  Vital Signs: Temp: 97.4 F (36.3 C) (06/17 0542) Temp src: Oral (06/17 0542) BP: 145/98 mmHg (06/17 0542) Pulse Rate: 71  (06/17 0542) Intake/Output from previous day: 06/16 0701 - 06/17 0700 In: 1803.8 [P.O.:1560; I.V.:243.8] Out: 2560 [Urine:2560] Intake/Output from this shift:    Labs:  Basename 10/30/11 0525 10/29/11 0518 10/28/11 1512  WBC -- 9.2 8.8  HGB -- 11.5* 12.0  PLT -- 312 346  LABCREA -- -- --  CREATININE 1.66* -- 1.21*     Basename 10/30/11 1035  VANCOTROUGH 23.9*  VANCOPEAK --  Drue Dun --  GENTTROUGH --  GENTPEAK --  GENTRANDOM --  TOBRATROUGH --  TOBRAPEAK --  TOBRARND --  AMIKACINPEAK --  AMIKACINTROU --  AMIKACIN --     Microbiology: No results found for this or any previous visit (from the past 720 hour(s)).  Anti-infectives     Start     Dose/Rate Route Frequency Ordered Stop   10/30/11 2000   vancomycin (VANCOCIN) 1,750 mg in sodium chloride 0.9 % 500 mL IVPB        1,750 mg 250 mL/hr over 120 Minutes Intravenous Every 24 hours 10/30/11 1153     10/29/11 2300   vancomycin (VANCOCIN) 1,500 mg in sodium chloride 0.9 % 500 mL IVPB  Status:  Discontinued        1,500 mg 250 mL/hr over 120 Minutes Intravenous Every 12 hours 10/29/11 1035 10/30/11 1149   10/29/11 1100   vancomycin (VANCOCIN) 2,000 mg in sodium chloride 0.9 % 500 mL IVPB        2,000 mg 250 mL/hr over 120 Minutes Intravenous  Once 10/29/11 1034 10/29/11 1317   10/29/11 1000   metroNIDAZOLE (FLAGYL) tablet 2,000 mg        2,000 mg Oral  Once 10/29/11 0958 10/29/11 1036   10/28/11 2200   ciprofloxacin (CIPRO) IVPB 400 mg  Status:  Discontinued        400 mg 200 mL/hr over 60 Minutes Intravenous Every  12 hours 10/28/11 2109 10/29/11 0925   10/28/11 2130   metroNIDAZOLE (FLAGYL) IVPB 500 mg  Status:  Discontinued        500 mg 100 mL/hr over 60 Minutes Intravenous Every 8 hours 10/28/11 2109 10/29/11 0925          Assessment: Vancomycin trough level = 23.9 Patient's SCr has increased from 1.21 to 1.66. Patient received contrast media on 10/28/11.  Goal of Therapy:  Vancomycin trough level 10-15 mcg/ml  Plan:  Will change Vancomycin to 1750 mg IV Q24hr. Monitor patient's renal function and adjust doses as needed.  Natasha Bence 10/30/2011,11:55 AM

## 2011-10-30 NOTE — Progress Notes (Signed)
INITIAL ADULT NUTRITION ASSESSMENT Date: 10/30/2011   Time: 1:58 PM Reason for Assessment: Diabetic Diet Education  ASSESSMENT: Female 36 y.o.  Dx.  Patient Active Problem List  Diagnosis  . Vulvar abscess   Hx:  Past Medical History  Diagnosis Date  . Diabetes mellitus   . Asthma   . CHF (congestive heart failure)   . Chronic back pain   . Migraine headache   . Hypertension   . Morbid obesity   . Polysubstance abuse   . Sleep apnea   . Drug-seeking behavior    Related Meds:     . furosemide  20 mg Oral Daily  . hydrochlorothiazide  25 mg Oral Daily  . insulin aspart  0-20 Units Subcutaneous TID WC  . insulin aspart  0-5 Units Subcutaneous QHS  . insulin glargine  22 Units Subcutaneous BID  . labetalol  100 mg Oral BID  . vancomycin  1,750 mg Intravenous Q24H  . DISCONTD: metoprolol tartrate  150 mg Oral Daily  . DISCONTD: metoprolol tartrate  50 mg Oral QHS  . DISCONTD: vancomycin  1,500 mg Intravenous Q12H    Ht: 5\' 6"  (167.6 cm)  Wt: 348 lb 12 oz (158.192 kg)  Ideal Wt: 59.3 kg % Ideal Wt: 268%  Usual Wt: 348 Lbs % Usual Wt: 100%  Body mass index is 56.29 kg/(m^2).  Food/Nutrition Related Hx: Non compliant to carbohydrate counting. Has eliminated most desserts and high fat foods, juice and breads.  4 years ago weighed 540 Lbs, and has lost approx 200 Lbs over the past 4 years. Wishes to loose additional weight.  Labs:  CBG (last 3)   Basename 10/30/11 1038 10/30/11 0533 10/29/11 2125  GLUCAP 204* 176* 203*   HA1C 10.8  Intake:   Intake/Output Summary (Last 24 hours) at 10/30/11 1403 Last data filed at 10/30/11 0541  Gross per 24 hour  Intake 1323.75 ml  Output   1860 ml  Net -536.25 ml   Output:   Diet Order: Carb Control Carb modified high 2000-2500 Kcal/day  Supplements/Tube Feeding:  IVF:    sodium chloride Last Rate: 125 mL/hr at 10/30/11 0541    Estimated Nutritional Needs:   Kcal: 23-2700  Protein: 85-100 g Fluid: 2.7  L  NUTRITION DIAGNOSIS: -Altered nutrition-related laboratory values (specify) (NI-2.2).  Status: Ongoing r/t non compliance to diet, inability to afford medications aeb elevated H A1C    MONITORING/EVALUATION(Goals): Improved glucose control, further weight loss  EDUCATION NEEDS: -Education needs addressed  INTERVENTION: Education on Carbohydrate modified diet completed. Meal plan for Type I diabetes reviewed with patient. Given carbohydrate goals.  Dietitian #:[id number]  DOCUMENTATION CODES Per approved criteria  -Morbid Obesity    Mallary Kreger,KATHY 10/30/2011, 1:58 PM

## 2011-10-30 NOTE — Consult Note (Addendum)
Inpatient Diabetes Program Recommendations  AACE/ADA: New Consensus Statement on Inpatient Glycemic Control (2009)  Target Ranges:  Prepandial:   less than 140 mg/dL      Peak postprandial:   less than 180 mg/dL (1-2 hours)      Critically ill patients:  140 - 180 mg/dL   Reason for Visit: Consult for education and recommendations for therapy for diabetes Spoke with pt regarding her history of diabetes and therapies she has used in the past.  She states that she has tried Actos, Metformin, Glyburide and Byetta as well as Lantus.  The Lantus is effective at controlling her basal levels, but she has had the most benefit from the Byetta.  She states however, that at that time, her insurance was not covering the cost of the Byetta, and she could not afford the $400.00 plus a month.  I have checked the [facility] Medicaid formulary, and Byetta is on their formulary.  Byetta is an incretin mimetic, a GLP1 inhibitor that is extremely effective in lowering/controlling  post-prandial glucose levels and in weight loss.  It is difficult for some pts to take due to the potential for N/V, however, this patient states it was helpful to her.  I would recommend starting this pt back on Byetta asap.  Typical starting dose for Byetta is 5 mcg bid, however, this patient was on 10 mcg bid.  Still, would start back at 5 mcg bid and to continue the Lantus as is ordered.   While here, could start on the Byetta as it is on formulary; until it is started, could order some Novolog meal coverage at 4 units tidwc in addition to the correction scale.  Pt knows how to administer the Byetta, so no instruction would be necessary t discharge.  My only concern with Byetta might be that it is not recommended with severely renal impaired patients, i.e. Creatinine clearance less than 30 ml/min.  Left printed information on Byetta in patient's shadow folder.  Note: Thank you. Please feel free to call. Lenor Coffin, RN, CNS, CDE [phone])

## 2011-10-31 DIAGNOSIS — L02219 Cutaneous abscess of trunk, unspecified: Secondary | ICD-10-CM

## 2011-10-31 DIAGNOSIS — E118 Type 2 diabetes mellitus with unspecified complications: Secondary | ICD-10-CM | POA: Diagnosis present

## 2011-10-31 DIAGNOSIS — R221 Localized swelling, mass and lump, neck: Secondary | ICD-10-CM | POA: Diagnosis present

## 2011-10-31 DIAGNOSIS — E119 Type 2 diabetes mellitus without complications: Secondary | ICD-10-CM

## 2011-10-31 DIAGNOSIS — Z720 Tobacco use: Secondary | ICD-10-CM | POA: Diagnosis present

## 2011-10-31 DIAGNOSIS — L03319 Cellulitis of trunk, unspecified: Secondary | ICD-10-CM

## 2011-10-31 DIAGNOSIS — G4733 Obstructive sleep apnea (adult) (pediatric): Secondary | ICD-10-CM | POA: Diagnosis present

## 2011-10-31 DIAGNOSIS — Z6841 Body Mass Index (BMI) 40.0 and over, adult: Secondary | ICD-10-CM

## 2011-10-31 DIAGNOSIS — F1910 Other psychoactive substance abuse, uncomplicated: Secondary | ICD-10-CM

## 2011-10-31 DIAGNOSIS — J449 Chronic obstructive pulmonary disease, unspecified: Secondary | ICD-10-CM | POA: Diagnosis present

## 2011-10-31 DIAGNOSIS — I5032 Chronic diastolic (congestive) heart failure: Secondary | ICD-10-CM

## 2011-10-31 DIAGNOSIS — I10 Essential (primary) hypertension: Secondary | ICD-10-CM | POA: Diagnosis present

## 2011-10-31 DIAGNOSIS — N764 Abscess of vulva: Secondary | ICD-10-CM

## 2011-10-31 DIAGNOSIS — R590 Localized enlarged lymph nodes: Secondary | ICD-10-CM | POA: Diagnosis present

## 2011-10-31 DIAGNOSIS — Z8679 Personal history of other diseases of the circulatory system: Secondary | ICD-10-CM

## 2011-10-31 LAB — BASIC METABOLIC PANEL
Calcium: 8.6 mg/dL (ref 8.4–10.5)
Creatinine, Ser: 1.47 mg/dL — ABNORMAL HIGH (ref 0.50–1.10)
GFR calc Af Amer: 52 mL/min — ABNORMAL LOW (ref >90)
GFR calc non Af Amer: 45 mL/min — ABNORMAL LOW (ref >90)
Sodium: 136 mEq/L (ref 135–145)

## 2011-10-31 LAB — GLUCOSE, CAPILLARY
Glucose-Capillary: 186 mg/dL — ABNORMAL HIGH (ref 70–99)
Glucose-Capillary: 166 mg/dL — ABNORMAL HIGH (ref 70–99)
Glucose-Capillary: 197 mg/dL — ABNORMAL HIGH (ref 70–99)

## 2011-10-31 MED ORDER — HYDROCHLOROTHIAZIDE 25 MG PO TABS
25.0000 mg | ORAL_TABLET | Freq: Every day | ORAL | Status: DC
  Administered 2011-10-31 – 2011-11-03 (×4): 25 mg via ORAL
  Filled 2011-10-31 (×5): qty 1

## 2011-10-31 MED ORDER — INSULIN GLARGINE 100 UNIT/ML ~~LOC~~ SOLN: 34.0000 [IU] | Freq: Two times a day (BID) | SUBCUTANEOUS | Status: DC

## 2011-10-31 MED ORDER — INSULIN GLARGINE 100 UNIT/ML ~~LOC~~ SOLN
40.0000 [IU] | Freq: Two times a day (BID) | SUBCUTANEOUS | Status: DC
  Administered 2011-10-31 – 2011-11-01 (×2): 40 [IU] via SUBCUTANEOUS

## 2011-10-31 NOTE — Progress Notes (Signed)
Pt just returned from being off the floor. She left via W/C to "get some fresh air" d/t family stress. Had to have security return pt after NT witnessed the pt and her friend in their [place], with at least the friend smoking.  The pt denies smoking and says she was just sitting in her [place] Niece.  Discussed our no smoking environment and the health hazards of smoking

## 2011-10-31 NOTE — Progress Notes (Addendum)
Subjective: Patient reports continued pain and is not having any drainage.  She continues to have poor glycemic control.  BP is also, poorly controlled.  Needs more medication. Objective: I have reviewed patient's vital signs, medications, labs, microbiology and radiology results.  Physical Exam  Vitals reviewed. Constitutional: No distress.  HENT:  Head: Normocephalic and atraumatic.  Cardiovascular: Normal rate.   Pulmonary/Chest: Effort normal.  Abdominal: Soft. There is no tenderness.  Genitourinary:       Vulva with induration on left mons approx. 4 x 6 cm.  No fluctuance noted.  She has minimal drainage from the left labia which is softer than mons.      Assessment/Plan: Patient Active Problem List  Diagnosis  . Vulvar abscess  . Diabetes mellitus type 2 with complications, uncontrolled  . Hypertension, uncontrolled  . History of congestive heart failure  . Morbid obesity with BMI of 50.0-59.9, adult  . Chronic renal insufficiency, stage III (moderate)  . Polysubstance abuse  . COPD (chronic obstructive pulmonary disease)  . Tobacco use  . Obstructive sleep apnea  . Neck mass  . Adenopathy, hilar   Slow improvement.  Will adjust medications as needed.  Continue IV abx and local treatment with warm compresses and sitz baths. IV pain control.  Will increase anti-hypertensives and increase her lantus.  LOS: 3 days    Christien Berthelot S 10/31/2011, 10:08 AM

## 2011-10-31 NOTE — Progress Notes (Signed)
10mg  IV Labetalol given at 1450 for BP 180/120

## 2011-10-31 NOTE — Consult Note (Signed)
[facility] Consult Follow-up Note  Debra Stokes:[id number] DOB: November 26, 1975 DOA: 10/28/2011 PCP: Lonell Face, MD Requesting physician: Dr. Penne Lash Reason for consultation: dm management  Impression/Recommendations   Diabetes Mellitus with complications, uncontrolled  - HgA1c 10.8  - Appreciate diabetic education and consult  - Please continue sliding scale regimen along with lantus 40 units BID for better glycemic control - Appreciate nutrition consult  - Please note positive cocaine on urine drug screen  Acute on chronic kidney disease, stage 3  - creatinine slowly trending down - can continue monitoring renal function although per [facility] record patient has had elevated creatinine values  - if it continues to trend up significantly I would recommend nephrology consult   HTN  - continue labetalol;  increased labetalol to 200 mg BID  - may add hydralazine for better BP control  - avoid metoprolol due to cocaine abuse   Disposition - will sign off  Please call for questions: [phone]  Code Status: full Family Communication: pt updated at bedside Disposition Plan: Per attending service  Manson Passey, MD  [facility] Pager [phone]  If 7PM-7AM, please contact night-coverage [email or web address] password [facility] 10/31/2011, 10:47 AM   LOS: 3 days   Interim History: Patient admitted for vulvar abscess, internal medicine consult requested for management of diabetes and other co morbidities including HTN and CKD.  Subjective: No acute events overnight  Objective: Filed Vitals:   10/31/11 0018 10/31/11 0109 10/31/11 0638 10/31/11 0700  BP: 162/105 125/82  162/122  Pulse: 79 72 71   Temp: 98 F (36.7 C) 98.1 F (36.7 C) 98.1 F (36.7 C)   TempSrc: Oral Oral Oral   Resp: 22 20 20    Height:      Weight:      SpO2: 93% 97% 96%      Intake/Output Summary (Last 24 hours) at 10/31/11 1047 Last data filed at 10/31/11 0600  Gross per 24 hour    Intake   2275 ml  Output      0 ml  Net   2275 ml    Exam: BP 144/94  Pulse 80  Temp 98.2 F (36.8 C) (Oral)  Resp 18  Ht 5\' 6"  (1.676 m)  Wt 158.192 kg (348 lb 12 oz)  BMI 56.29 kg/m2  SpO2 99%  LMP 10/25/2011  General Appearance:    Alert, cooperative, no distress, appears stated age  Head:    Normocephalic, without obvious abnormality, atraumatic  Eyes:    PERRL, conjunctiva/corneas clear, EOM's intact, fundi    benign, both eyes  Ears:    Normal TM's and external ear canals, both ears  Nose:   Nares normal, septum midline, mucosa normal, no drainage    or sinus tenderness  Throat:   Lips, mucosa, and tongue normal; teeth and gums normal  Neck:   Supple, symmetrical, trachea midline, no adenopathy;    thyroid:  no enlargement/tenderness/nodules; no carotid   bruit or JVD  Back:     Symmetric, no curvature, ROM normal, no CVA tenderness  Lungs:     Clear to auscultation bilaterally, respirations unlabored  Chest Wall:    No tenderness or deformity   Heart:    Regular rate and rhythm, S1 and S2 normal, no murmur, rub   or gallop  Breast Exam:    No tenderness, masses, or nipple abnormality  Abdomen:     Soft, non-tender, bowel sounds active all four quadrants,    no masses, no organomegaly  Extremities:  Extremities normal, atraumatic, no cyanosis or edema  Pulses:   2+ and symmetric all extremities  Skin:   Skin color, texture, turgor normal, no rashes or lesions  Lymph nodes:   Cervical, supraclavicular, and axillary nodes normal  Neurologic:   CNII-XII intact, normal strength, sensation and reflexes    throughout     Data Reviewed: Basic Metabolic Panel:  Lab 10/31/11 4098 10/30/11 0525 10/28/11 1512  NA 136 136 137  K 3.5 3.8 3.5  CL 100 97 98  CO2 27 29 28   GLUCOSE 175* 182* 249*  BUN 19 22 8   CREATININE 1.47* 1.66* 1.21*  CALCIUM 8.6 9.3 9.1  MG -- -- --  PHOS -- -- --   Liver Function Tests: No results found for this basename:  AST:5,ALT:5,ALKPHOS:5,BILITOT:5,PROT:5,ALBUMIN:5 in the last 168 hours No results found for this basename: LIPASE:5,AMYLASE:5 in the last 168 hours No results found for this basename: AMMONIA:5 in the last 168 hours CBC:  Lab 10/29/11 0518 10/28/11 1512  WBC 9.2 8.8  NEUTROABS 6.4 6.6  HGB 11.5* 12.0  HCT 37.4 37.1  MCV 91.4 89.2  PLT 312 346   Cardiac Enzymes: No results found for this basename: CKTOTAL:5,CKMB:5,CKMBINDEX:5,TROPONINI:5 in the last 168 hours BNP: No components found with this basename: POCBNP:5 CBG:  Lab 10/31/11 0652 10/30/11 2210 10/30/11 2032 10/30/11 1516 10/30/11 1038  GLUCAP 159* 279* 263* 220* 204*    Recent Results (from the past 240 hour(s))  WOUND CULTURE     Status: Normal (Preliminary result)   Collection Time   10/30/11  6:30 AM      Component Value Range Status Comment   Specimen Description VULVA   Final    Special Requests Normal   Final    Gram Stain     Final    Value: NO WBC SEEN     FEW SQUAMOUS EPITHELIAL CELLS PRESENT     NO ORGANISMS SEEN   Culture NO GROWTH 1 DAY   Final    Report Status PENDING   Incomplete   MRSA PCR SCREENING     Status: Normal   Collection Time   10/30/11 11:46 AM      Component Value Range Status Comment   MRSA by PCR NEGATIVE  NEGATIVE Final      Studies: Ct Angio Chest W/cm &/or Wo Cm  10/08/2011  **ADDENDUM** CREATED: 10/08/2011 15:51:38  The third paragraph of the impression of the chest CT should read the following:  Suspected mass extending from the right thoracic inlet into the  right paratracheal region. This may represent an aberrant  muscle or atrophy of the contralateral musculature. CT neck is  recommended to further evaluate as this abnormality is partially  imaged  **END ADDENDUM** SIGNED BY: Marlowe Aschoff. Hoss, M.D.   10/08/2011  *RADIOLOGY REPORT*  Clinical Data: Epigastric pain  CT ANGIOGRAPHY CHEST,CT ABDOMEN AND PELVIS WITH CONTRAST  Technique:  Multidetector CT imaging of the chest using the  standard protocol during bolus administration of intravenous contrast. Multiplanar reconstructed images including MIPs were obtained and reviewed to evaluate the vascular anatomy.,Technique: Mul  Contrast: OMNIPAQUE IOHEXOL 300 MG/ML  SOLN  Comparison: 03/08/2011  Findings: Motion artifacts severely degrades the exam.  No obvious filling defects in the pulmonary arterial tree to suggest acute pulmonary thromboembolism.  Emboli beyond the central vasculature could easily be missed by this study.  There is a right paratracheal soft tissue density extending from the thoracic inlet inferiorly it is asymmetrical with that of the  left side of the thoracic inlet.  It is inferior and posterior to the right lobe of the thyroid gland.  On image 4, it is 2.1 x 2.9 cm.  13 mm short axis diameter prevascular node on image 31.  Soft tissue densities in the hilar regions may represent borderline hilar adenopathy.  Scattered ground-glass opacities are seen throughout the lungs low lung volumes.  No acute bony deformity.  IMPRESSION: Study is markedly limited for the evaluation of the pulmonary arterial tree.  No obvious pulmonary thromboembolism.  Mild mediastinal adenopathy as described.  Followup study in 3-6 months is recommended to ensure stability.  Considerations include inflammatory and neoplastic etiology.  Suspected mass extending from the right thoracic inlet into the right paratracheal region.  This may represent and eight very muscle or atrophy of the contralateral musculature.  CT neck is recommended to further evaluate as this abnormality is partially imaged.  CT ABDOMEN AND PELVIS WITH CONTRAST  Technique:  Multidetector CT imaging of the abdomen and pelvis was performed using the standard protocol following bolus administration of intravenous contrast.  Contrast: OMNIPAQUE IOHEXOL 300 MG/ML  SOLN  Comparison:   None.  Findings:  Post cholecystectomy.  Liver, spleen, pancreas, adrenal glands, kidneys are  within normal limits.  Normal appendix.  No free fluid.  Bladder, uterus, and adnexa are unremarkable.  No abnormal adenopathy in the abdomen or pelvis.  No acute bony deformity.  IMPRESSION: No acute intra-abdominal pathology. Original Report Authenticated By: Donavan Burnet, M.D.   Ct Pelvis W Contrast  10/28/2011  *RADIOLOGY REPORT*  Clinical Data:  Left labial abscess, status post I&D on 6/10  CT PELVIS WITH CONTRAST  Technique:  Multidetector CT imaging of the pelvis was performed using the standard protocol following the bolus administration of intravenous contrast.  Contrast: OMNIPAQUE IOHEXOL 300 MG/ML  SOLN  Comparison:  None.  Findings:  No drainable fluid collection or abscess.  Mottled lucencies in the left perineum/labia (for example, series 2/image 52), likely related to recent intervention and/or known infection.  Overlying subcutaneous stranding/edema, reflecting cellulitis.  Uterus and bilateral ovaries are unremarkable.  Bladder is within normal limits.  No pelvic ascites.  Visualized bowel is unremarkable.  Mild degenerative changes at L5-S1.  IMPRESSION: No drainable fluid collection or abscess.  Mottled lucencies in the left perineum/labia, likely related to recent intervention and/or known infection.  Overlying subcutaneous stranding/edema, reflecting cellulitis.  Original Report Authenticated By: Charline Bills, M.D.   Ct Abdomen Pelvis W Contrast  10/08/2011  **ADDENDUM** CREATED: 10/08/2011 15:51:38  The third paragraph of the impression of the chest CT should read the following:  Suspected mass extending from the right thoracic inlet into the  right paratracheal region. This may represent an aberrant  muscle or atrophy of the contralateral musculature. CT neck is  recommended to further evaluate as this abnormality is partially  imaged  **END ADDENDUM** SIGNED BY: Marlowe Aschoff. Hoss, M.D.   10/08/2011  *RADIOLOGY REPORT*  Clinical Data: Epigastric pain  CT ANGIOGRAPHY CHEST,CT  ABDOMEN AND PELVIS WITH CONTRAST  Technique:  Multidetector CT imaging of the chest using the standard protocol during bolus administration of intravenous contrast. Multiplanar reconstructed images including MIPs were obtained and reviewed to evaluate the vascular anatomy.,Technique: Mul  Contrast: OMNIPAQUE IOHEXOL 300 MG/ML  SOLN  Comparison: 03/08/2011  Findings: Motion artifacts severely degrades the exam.  No obvious filling defects in the pulmonary arterial tree to suggest acute pulmonary thromboembolism.  Emboli beyond the central vasculature could  easily be missed by this study.  There is a right paratracheal soft tissue density extending from the thoracic inlet inferiorly it is asymmetrical with that of the left side of the thoracic inlet.  It is inferior and posterior to the right lobe of the thyroid gland.  On image 4, it is 2.1 x 2.9 cm.  13 mm short axis diameter prevascular node on image 31.  Soft tissue densities in the hilar regions may represent borderline hilar adenopathy.  Scattered ground-glass opacities are seen throughout the lungs low lung volumes.  No acute bony deformity.  IMPRESSION: Study is markedly limited for the evaluation of the pulmonary arterial tree.  No obvious pulmonary thromboembolism.  Mild mediastinal adenopathy as described.  Followup study in 3-6 months is recommended to ensure stability.  Considerations include inflammatory and neoplastic etiology.  Suspected mass extending from the right thoracic inlet into the right paratracheal region.  This may represent and eight very muscle or atrophy of the contralateral musculature.  CT neck is recommended to further evaluate as this abnormality is partially imaged.  CT ABDOMEN AND PELVIS WITH CONTRAST  Technique:  Multidetector CT imaging of the abdomen and pelvis was performed using the standard protocol following bolus administration of intravenous contrast.  Contrast: OMNIPAQUE IOHEXOL 300 MG/ML  SOLN  Comparison:    None.  Findings:  Post cholecystectomy.  Liver, spleen, pancreas, adrenal glands, kidneys are within normal limits.  Normal appendix.  No free fluid.  Bladder, uterus, and adnexa are unremarkable.  No abnormal adenopathy in the abdomen or pelvis.  No acute bony deformity.  IMPRESSION: No acute intra-abdominal pathology. Original Report Authenticated By: Donavan Burnet, M.D.    Scheduled Meds:   . hydrochlorothiazide  25 mg Oral Daily  . insulin aspart  0-20 Units Subcutaneous TID WC  . insulin aspart  0-5 Units Subcutaneous QHS  . insulin glargine  40 Units Subcutaneous BID  . labetalol  200 mg Oral BID  . vancomycin  1,750 mg Intravenous Q24H  . DISCONTD: furosemide  20 mg Oral Daily  . DISCONTD: hydrochlorothiazide  25 mg Oral Daily  . DISCONTD: insulin glargine  22 Units Subcutaneous BID  . DISCONTD: insulin glargine  30 Units Subcutaneous BID  . DISCONTD: insulin glargine  34 Units Subcutaneous BID  . DISCONTD: labetalol  100 mg Oral BID  . DISCONTD: vancomycin  1,500 mg Intravenous Q12H   Continuous Infusions:   . sodium chloride 125 mL/hr at 10/31/11 0010

## 2011-10-31 NOTE — Progress Notes (Signed)
10/31/11 1126  Vitals  Pulse Rate 74   Pulse Rate Source Dinamap  BP ! 162/106 mmHg  BP Location Left arm  BP Method Automatic   Dr. Shawnie Pons made aware-no new orders received.

## 2011-10-31 NOTE — Progress Notes (Signed)
Pt complaining of 9/10 pain after going to the bathroom and wiping. Offered pt oral pain medicine. Pt refused oral pain medicine saying that it was "pointless, and did not work." Explained to pt MD wanted oral pain medicine to be given first with IV pain meds to be given when pain unrelieved by po meds. Pt refused. IV pain medicine given.

## 2011-10-31 NOTE — Progress Notes (Signed)
10/31/11 1200  Clinical Encounter Type  Visited With Patient  Visit Type Follow-up;Spiritual support;Social support;Psychological support  Referral From Nurse Delorse Lek, RN)  Recommendations Spiritual Care will follow for support and encouragement.  Spiritual Encounters  Spiritual Needs Emotional  Stress Factors  Patient Stress Factors Family relationships;Exhausted    Made very lengthy visit with pt, who was down and distraught about a number of concerns, including the disappointment that her mother decided not to come visit today as previously planned, but will come to town Saturday instead.  Lincy is feeling stressed and burdened by the amount of caregiving she does at home (for children and young adults entrusted to her by other family), especially coupled with how little care and support she receives from others.  We talked extensively about her hopes and goals for herself, centering around establishing firmer boundaries in order to increase and improve her own self-care while reducing the care she provides for others.  Her guiding image is "peace," wanting to develop more peace in her own life (fewer meds, greater health, more time and space to herself, focusing on her own goals instead of overstretching herself to help others).  Thinking about change incrementally lightened her affect and verbal communication, shifting to an optimistic, empowered orientation.  Thinking about this hospital stay as an opportunity for rest and re-envisioning her life was also helpful.  By the end of the visit, Tamecka was emotionally very tired, but also more hopeful.  Provided pastoral listening and reflection, affirmation of her feelings and personhood when feeling rejected by her mom (and others), encouragement, brainstorming and empowerment for envisioning positive change in her life (namely, taking more ownership of her own needs and fulfillment).  Spiritual Care will continue to follow for spiritual  and emotional support.  Pt is aware of ongoing chaplain availability.    [address] [place], [place] [phone]

## 2011-11-01 LAB — GLUCOSE, CAPILLARY
Glucose-Capillary: 143 mg/dL — ABNORMAL HIGH (ref 70–99)
Glucose-Capillary: 91 mg/dL (ref 70–99)
Glucose-Capillary: 122 mg/dL — ABNORMAL HIGH (ref 70–99)

## 2011-11-01 LAB — WOUND CULTURE: Special Requests: NORMAL

## 2011-11-01 MED ORDER — INSULIN GLARGINE 100 UNIT/ML ~~LOC~~ SOLN
40.0000 [IU] | Freq: Every day | SUBCUTANEOUS | Status: DC
  Administered 2011-11-02 – 2011-11-04 (×3): 40 [IU] via SUBCUTANEOUS

## 2011-11-01 MED ORDER — EXENATIDE 5 MCG/0.02ML ~~LOC~~ SOPN
5.0000 ug | PEN_INJECTOR | Freq: Two times a day (BID) | SUBCUTANEOUS | Status: DC
  Filled 2011-11-01 (×15): qty 0.02

## 2011-11-01 MED ORDER — EXENATIDE 5 MCG/0.02ML ~~LOC~~ SOPN: 5.0000 ug | PEN_INJECTOR | Freq: Two times a day (BID) | SUBCUTANEOUS | Status: DC

## 2011-11-01 MED ORDER — LISINOPRIL 10 MG PO TABS
10.0000 mg | ORAL_TABLET | Freq: Every day | ORAL | Status: DC
  Administered 2011-11-01 – 2011-11-04 (×4): 10 mg via ORAL
  Filled 2011-11-01 (×4): qty 1

## 2011-11-01 MED ORDER — EXENATIDE 5 MCG/0.02ML ~~LOC~~ SOPN
5.0000 ug | PEN_INJECTOR | Freq: Two times a day (BID) | SUBCUTANEOUS | Status: DC
  Administered 2011-11-01 – 2011-11-04 (×5): 5 ug via SUBCUTANEOUS
  Filled 2011-11-01: qty 1.2

## 2011-11-01 NOTE — Progress Notes (Signed)
Subjective: Patient reports tolerating PO and no problems voiding.  Feels only a slight improvement in her vulvar pain.  Objective: I have reviewed patient's vital signs, intake and output, medications, labs and microbiology.  General: alert Resp: clear to auscultation bilaterally Cardio: regular rate and rhythm, S1, S2 normal, no murmur, click, rub or gallop GI: soft, non-tender; bowel sounds normal; no masses,  no organomegaly Extremities: extremities normal, atraumatic, no cyanosis or edema Vulva- left 5 X 5 tender indurated area on left mons.  Left vulva soft, non-draining i & d site  Assessment/Plan: Cellulitis of mons- continue vancomycin Diabetes and HTN- improving  LOS: 4 days    Tyhir Schwan C. 11/01/2011, 9:58 AM

## 2011-11-01 NOTE — Consult Note (Addendum)
Inpatient Diabetes Program Recommendations  AACE/ADA: New Consensus Statement on Inpatient Glycemic Control (2009)  Target Ranges:  Prepandial:   less than 140 mg/dL      Peak postprandial:   less than 180 mg/dL (1-2 hours)      Critically ill patients:  140 - 180 mg/dL   Reason for Visit: Consult per Dr Jae Dire with Dr Marice Potter here on the unit regarding Byetta twice a day for this patient.  Pt states she has been on this med prior, approximately 5-6 years ago before her insurance ran out.  It will be started while here to test effectiveness.  Lantus was decreased to half her dose (down to 40 units daily) and correction scale will be continued as ordered.  Byetta to start tonight with supper. May not see results of Byetta immediately, and pt may need more Lantus, but this is a safe start.    Note: Thank you, Lenor Coffin, RN, CNS, Diabetes Coordinator [phone]) (cell  [phone])

## 2011-11-02 LAB — GLUCOSE, CAPILLARY
Glucose-Capillary: 115 mg/dL — ABNORMAL HIGH (ref 70–99)
Glucose-Capillary: 120 mg/dL — ABNORMAL HIGH (ref 70–99)

## 2011-11-02 NOTE — Progress Notes (Addendum)
Pt has received 30 mg of labetalol IVP and BP remains high.  Pt is anxious and states she just needs to relax.  Plan to administer requested pain med and anti anxiety med when due then will recheck BP.  Will continue to monitor.

## 2011-11-02 NOTE — Progress Notes (Signed)
Administered pain and anxiety medications.  Pt has also received her 2200 dose of PO labetalol.  Pt has gotten comfortable and seems more relaxed.  Will recheck BP.

## 2011-11-02 NOTE — Progress Notes (Signed)
UR Chart review completed.  

## 2011-11-02 NOTE — Progress Notes (Signed)
Subjective: Patient reports tolerating PO, + flatus and no problems voiding.    Objective: I have reviewed patient's vital signs, medications, labs and microbiology.  General: alert Resp: clear to auscultation bilaterally Cardio: regular rate and rhythm, S1, S2 normal, no murmur, click, rub or gallop GI: soft, non-tender; bowel sounds normal; no masses,  no organomegaly Mons-definite improvement in size of induration Excellent sugars with byetta  Assessment/Plan: Mons cellulitis- improving. Possible discharge in 1 or 2 days.  LOS: 5 days    Arlette Schaad C. 11/02/2011, 9:52 AM

## 2011-11-02 NOTE — Progress Notes (Signed)
ANTIBIOTIC CONSULT NOTE - FOLLOW UP  Pharmacy Consult for Vancomycin Indication: Left labial infection  No Known Allergies  Patient Measurements: Height: 5\' 6"  (167.6 cm) Weight: 348 lb 12 oz (158.192 kg) IBW/kg (Calculated) : 59.3  Adjusted Body Weight: 89 kg  Vital Signs: Temp: 98.2 F (36.8 C) (06/20 1000) Temp src: Oral (06/20 0604) BP: 154/107 mmHg (06/20 1000) Pulse Rate: 74  (06/20 1000) Intake/Output from previous day: 06/19 0701 - 06/20 0700 In: 3830.4 [P.O.:720; I.V.:3110.4] Out: 1350 [Urine:1350] Intake/Output from this shift:    Labs:  Basename 10/31/11 0550  WBC --  HGB --  PLT --  LABCREA --  CREATININE 1.47*       Microbiology: Recent Results (from the past 720 hour(s))  WOUND CULTURE     Status: Normal   Collection Time   10/30/11  6:30 AM      Component Value Range Status Comment   Specimen Description VULVA   Final    Special Requests Normal   Final    Gram Stain     Final    Value: NO WBC SEEN     FEW SQUAMOUS EPITHELIAL CELLS PRESENT     NO ORGANISMS SEEN   Culture RARE CANDIDA ALBICANS   Final    Report Status 11/01/2011 FINAL   Final   MRSA PCR SCREENING     Status: Normal   Collection Time   10/30/11 11:46 AM      Component Value Range Status Comment   MRSA by PCR NEGATIVE  NEGATIVE Final     Anti-infectives     Start     Dose/Rate Route Frequency Ordered Stop   10/30/11 2000   vancomycin (VANCOCIN) 1,750 mg in sodium chloride 0.9 % 500 mL IVPB        1,750 mg 250 mL/hr over 120 Minutes Intravenous Every 24 hours 10/30/11 1153     10/29/11 2300   vancomycin (VANCOCIN) 1,500 mg in sodium chloride 0.9 % 500 mL IVPB  Status:  Discontinued        1,500 mg 250 mL/hr over 120 Minutes Intravenous Every 12 hours 10/29/11 1035 10/30/11 1149   10/29/11 1100   vancomycin (VANCOCIN) 2,000 mg in sodium chloride 0.9 % 500 mL IVPB        2,000 mg 250 mL/hr over 120 Minutes Intravenous  Once 10/29/11 1034 10/29/11 1317   10/29/11 1000    metroNIDAZOLE (FLAGYL) tablet 2,000 mg        2,000 mg Oral  Once 10/29/11 0958 10/29/11 1036   10/28/11 2200   ciprofloxacin (CIPRO) IVPB 400 mg  Status:  Discontinued        400 mg 200 mL/hr over 60 Minutes Intravenous Every 12 hours 10/28/11 2109 10/29/11 0925   10/28/11 2130   metroNIDAZOLE (FLAGYL) IVPB 500 mg  Status:  Discontinued        500 mg 100 mL/hr over 60 Minutes Intravenous Every 8 hours 10/28/11 2109 10/29/11 0925          Assessment: Last SCr measured on 6/18 was 1.47 which was down from 1.66.  Infection improving on current dose.  Goal of Therapy:  Vancomycin trough level 10-15 mcg/ml  Plan:  Continue Vancomycin at current dose.   Check SCr in AM. Plan today was for possible discharge in 1-2 days.   Will not check vancomycin level at this time.  Natasha Bence 11/02/2011,11:44 AM

## 2011-11-02 NOTE — Progress Notes (Signed)
Rechecked BP; BP 119/75 and pulse is 79.  Pt is comfortable and states she has no pain.  Will continue to monitor.

## 2011-11-03 LAB — CBC
MCH: 28.2 pg (ref 26.0–34.0)
MCHC: 30.7 g/dL (ref 30.0–36.0)
MCV: 91.7 fL (ref 78.0–100.0)
Platelets: 277 10*3/uL (ref 150–400)
RBC: 3.73 MIL/uL — ABNORMAL LOW (ref 3.87–5.11)
RDW: 15.8 % — ABNORMAL HIGH (ref 11.5–15.5)

## 2011-11-03 LAB — GLUCOSE, CAPILLARY
Glucose-Capillary: 127 mg/dL — ABNORMAL HIGH (ref 70–99)
Glucose-Capillary: 93 mg/dL (ref 70–99)

## 2011-11-03 LAB — BASIC METABOLIC PANEL
BUN: 16 mg/dL (ref 6–23)
CO2: 26 mEq/L (ref 19–32)
Calcium: 9.1 mg/dL (ref 8.4–10.5)
Creatinine, Ser: 1.68 mg/dL — ABNORMAL HIGH (ref 0.50–1.10)
GFR calc non Af Amer: 39 mL/min — ABNORMAL LOW (ref >90)
Glucose, Bld: 139 mg/dL — ABNORMAL HIGH (ref 70–99)

## 2011-11-03 MED ORDER — LABETALOL HCL 200 MG PO TABS
400.0000 mg | ORAL_TABLET | Freq: Three times a day (TID) | ORAL | Status: DC
  Administered 2011-11-03 (×2): 400 mg via ORAL
  Filled 2011-11-03 (×4): qty 2

## 2011-11-03 MED ORDER — IBUPROFEN 600 MG PO TABS: 600.0000 mg | ORAL_TABLET | Freq: Four times a day (QID) | ORAL | Status: DC | PRN

## 2011-11-03 MED ORDER — HYDROCHLOROTHIAZIDE 25 MG PO TABS
50.0000 mg | ORAL_TABLET | Freq: Every day | ORAL | Status: DC
  Administered 2011-11-04: 50 mg via ORAL
  Filled 2011-11-03 (×2): qty 2

## 2011-11-03 MED ORDER — HYDROMORPHONE HCL 2 MG PO TABS
4.0000 mg | ORAL_TABLET | ORAL | Status: DC | PRN
  Administered 2011-11-03 – 2011-11-04 (×2): 4 mg via ORAL
  Filled 2011-11-03 (×2): qty 1
  Filled 2011-11-03: qty 2

## 2011-11-03 MED ORDER — LABETALOL HCL 300 MG PO TABS
600.0000 mg | ORAL_TABLET | Freq: Three times a day (TID) | ORAL | Status: DC
  Administered 2011-11-03 – 2011-11-04 (×2): 600 mg via ORAL
  Filled 2011-11-03 (×3): qty 2

## 2011-11-03 NOTE — Progress Notes (Signed)
Patient ID: Debra Stokes, female   DOB: 1975/07/03, 36 y.o.   MRN: [id number] Subjective: Patient reports tolerating PO, + flatus and no problems voiding.  Patient complaining of pain at IV site. Patient reports persistent pain over mons but slowly improving since admission  Objective: I have reviewed patient's vital signs, medications, labs and microbiology.  General: alert Resp: clear to auscultation bilaterally Cardio: regular rate and rhythm, S1, S2 normal, no murmur, click, rub or gallop GI: soft, non-tender; bowel sounds normal; no masses,  no organomegaly Mons-No erythema, 5 cm area of induration tender to touch  Assessment/Plan: Mons cellulitis- improving.  Continue current care BP elevated. Will increase labetalol to TID dosing CBG better controlled   LOS: 6 days    Debra Stokes 11/03/2011, 8:52 AM

## 2011-11-04 DIAGNOSIS — Z6841 Body Mass Index (BMI) 40.0 and over, adult: Secondary | ICD-10-CM

## 2011-11-04 DIAGNOSIS — I10 Essential (primary) hypertension: Secondary | ICD-10-CM

## 2011-11-04 DIAGNOSIS — E1165 Type 2 diabetes mellitus with hyperglycemia: Secondary | ICD-10-CM

## 2011-11-04 DIAGNOSIS — F1910 Other psychoactive substance abuse, uncomplicated: Secondary | ICD-10-CM

## 2011-11-04 DIAGNOSIS — E669 Obesity, unspecified: Secondary | ICD-10-CM

## 2011-11-04 DIAGNOSIS — IMO0002 Reserved for concepts with insufficient information to code with codable children: Secondary | ICD-10-CM

## 2011-11-04 LAB — GLUCOSE, CAPILLARY: Glucose-Capillary: 96 mg/dL (ref 70–99)

## 2011-11-04 MED ORDER — LISINOPRIL 10 MG PO TABS: 10.0000 mg | ORAL_TABLET | Freq: Every day | ORAL | Status: DC

## 2011-11-04 MED ORDER — DOXYCYCLINE HYCLATE 100 MG PO TABS: 100.0000 mg | ORAL_TABLET | Freq: Two times a day (BID) | ORAL | Status: AC

## 2011-11-04 MED ORDER — INSULIN ASPART 100 UNIT/ML ~~LOC~~ SOLN: 0.0000 [IU] | Freq: Three times a day (TID) | SUBCUTANEOUS | Status: DC

## 2011-11-04 MED ORDER — INSULIN ASPART 100 UNIT/ML ~~LOC~~ SOLN: 0.0000 [IU] | Freq: Every day | SUBCUTANEOUS | Status: DC

## 2011-11-04 MED ORDER — LABETALOL HCL 300 MG PO TABS: 600.0000 mg | ORAL_TABLET | Freq: Three times a day (TID) | ORAL | Status: DC

## 2011-11-04 MED ORDER — INSULIN GLARGINE 100 UNIT/ML ~~LOC~~ SOLN: 40.0000 [IU] | Freq: Every day | SUBCUTANEOUS | Status: DC

## 2011-11-04 MED ORDER — EXENATIDE 5 MCG/0.02ML ~~LOC~~ SOPN: 5.0000 ug | PEN_INJECTOR | Freq: Two times a day (BID) | SUBCUTANEOUS | Status: DC

## 2011-11-04 MED ORDER — HYDROCODONE-ACETAMINOPHEN 5-325 MG PO TABS: 1.0000 | ORAL_TABLET | Freq: Four times a day (QID) | ORAL | Status: AC | PRN

## 2011-11-04 MED ORDER — IBUPROFEN 600 MG PO TABS: 600.0000 mg | ORAL_TABLET | Freq: Four times a day (QID) | ORAL | Status: AC | PRN

## 2011-11-04 MED ORDER — HYDROCHLOROTHIAZIDE 50 MG PO TABS: 50.0000 mg | ORAL_TABLET | Freq: Every day | ORAL | Status: DC

## 2011-11-04 NOTE — Progress Notes (Signed)
Discharge instructions reviewed with patient.  Patient states understanding of home care and signs/symptoms to report to MD.  No home equipment needed.  Wheelchair to car with staff without incident.  Discharged to home with significant other.

## 2011-11-04 NOTE — Discharge Summary (Signed)
Physician Discharge Summary  Patient ID: Debra Stokes MRN: [id number] DOB/AGE: 36-Jun-1977 36 y.o.  Admit date: 10/28/2011 Discharge date: 11/04/2011  Admission Diagnoses: Vulvar abscess  Discharge Diagnoses: Resolving vulvar abscess Principal Problem:  *Vulvar abscess Active Problems:  Diabetes mellitus type 2 with complications, uncontrolled  Hypertension, uncontrolled  History of congestive heart failure  Morbid obesity with BMI of 50.0-59.9, adult  Chronic renal insufficiency, stage III (moderate)  Polysubstance abuse  COPD (chronic obstructive pulmonary disease)  Tobacco use  Obstructive sleep apnea  Neck mass  Adenopathy, hilar   Discharged Condition: good  Hospital Course: 36 yo admitted on 6/15 for inpatient management of vulvar abscess. Patient had previously failed outpatient treatment s/p I&D of labial abscess and oral antibiotics. Patient returned with worsening vulva pain and the abscess now involving her mons pubis. Patient was admitted and treated with IV vancomycin. Her labial abscess resolved and her mons pubis abscess slowly improved by decreasing in size. Throughout her hospitalization, patient remained afebrile, and never had an elevated white count. Patient also suffers from other medical co-morbidities  (HTN, Type II DM, CHF, morbid obesity, CRI) for which a medicine consult was called. Her HTN and diabetes were better controlled. Patient found to be stable for discharge on HD#7.  Consults: Internal medicine  Significant Diagnostic Studies: radiology: CT scan: localized abscess to mons and vulva  Treatments: antibiotics: vancomycin  Discharge Exam: Blood pressure 139/87, pulse 83, temperature 98 F (36.7 C), temperature source Oral, resp. rate 20, height 5\' 6"  (1.676 m), weight 158.192 kg (348 lb 12 oz), last menstrual period 10/25/2011, SpO2 98.00%. General appearance: alert, cooperative, no distress and eager to be discharged GI: soft, non-tender; bowel  sounds normal; no masses,  no organomegaly and obese Pelvic: 2 x 4 cm area of induration in mons pubis, softer to the touch, still tender. No drainage. Labia normal without induration, drainage or erythema  Disposition: 01-Home or Self Care   Medication List  As of 11/04/2011  7:06 AM   STOP taking these medications         furosemide 20 MG tablet      metoprolol 100 MG tablet      metoprolol 50 MG tablet      sulfamethoxazole-trimethoprim 800-160 MG per tablet         TAKE these medications         doxycycline 100 MG tablet   Commonly known as: VIBRA-TABS   Take 1 tablet (100 mg total) by mouth 2 (two) times daily.      exenatide 5 MCG/0.02ML Soln   Commonly known as: BYETTA   Inject 0.02 mLs (5 mcg total) into the skin 2 (two) times daily with a meal.      hydrochlorothiazide 50 MG tablet   Commonly known as: HYDRODIURIL   Take 1 tablet (50 mg total) by mouth daily.      HYDROcodone-acetaminophen 5-325 MG per tablet   Commonly known as: NORCO   Take 1-2 tablets by mouth every 6 (six) hours as needed for pain.      ibuprofen 600 MG tablet   Commonly known as: ADVIL,MOTRIN   Take 1 tablet (600 mg total) by mouth every 6 (six) hours as needed.      insulin aspart 100 UNIT/ML injection   Commonly known as: novoLOG   Inject 0-20 Units into the skin 3 (three) times daily with meals.      insulin aspart 100 UNIT/ML injection   Commonly known as: novoLOG   Inject  0-5 Units into the skin at bedtime.      insulin glargine 100 UNIT/ML injection   Commonly known as: LANTUS   Inject 40 Units into the skin daily.      labetalol 300 MG tablet   Commonly known as: NORMODYNE   Take 2 tablets (600 mg total) by mouth 3 (three) times daily.      lisinopril 10 MG tablet   Commonly known as: PRINIVIL,ZESTRIL   Take 1 tablet (10 mg total) by mouth daily.      LORazepam 2 MG tablet   Commonly known as: ATIVAN   Take 2 mg by mouth every 6 (six) hours as needed. For anxiety.        naproxen sodium 220 MG tablet   Commonly known as: ANAPROX   Take 220 mg by mouth 2 (two) times daily with a meal.      TYLENOL COLD MULTI-SYMPTOM 5-6.25-10-325 MG/15ML Liqd   Generic drug: Phenyleph-Doxylamine-DM-APAP   Take 30 mLs by mouth every 6 (six) hours as needed. For cough and cold           Follow-up Information    Follow up with [facility] in 2 weeks.   Contact information:   [address] [place] [address]       Please follow up. (Schedule appointment with primary care physician)        Patient advised to continue applying warm compresses to area 3-4 times a day. Patient to schedule appointment with primary care physician for other medical co-morbidities  Signed: Azari Hasler 11/04/2011, 7:06 AM

## 2011-11-04 NOTE — Discharge Instructions (Signed)
Vulvar Abscess  The vulva is made up of the large and small flaps of skin around the vagina opening. A vulvar abscess is an infected sac of yellowish white fluid (pus) in the skin flaps. Your doctor may make a small cut in the skin to drain the vulvar abscess.  HOME CARE  Only take medicine as told by your doctor.   Soak or take a warm water bath (sitz bath) 3 to 4 times a day for 15 to 20 minutes.   After you pee (urinate), always wipe from front to back.   Clean the vulvar abscess with soap and warm water. Do this after going to the bathroom.   Wear loose-fitting clothing.   Do not have sex until the vulvar abscess is gone or as told by your doctor.  GET HELP RIGHT AWAY IF:   You have a temperature by mouth above 100.4 F (38 C).   The vulva area becomes more painful, puffy (swollen), or red.   You have fluid coming from the vulva area that is red or tan.   You have pain when you pee or have a hard time peeing.  MAKE SURE YOU:  Understand these instructions.   Will watch your condition.   Will get help if you are not doing well or get worse.  Document Released: 07/28/2008 Document Revised: 04/20/2011 Document Reviewed: 07/28/2008 [facility] Patient Information 2012 [place], [place].

## 2011-11-04 NOTE — Progress Notes (Signed)
Pt was saline locked to take a shower.  IV site was covered up.  Pt stated IV became dislodged while in the shower.  Tried to save IV, but pt stated it was too painful.  IV was removed and pt states she does not want another at this time.  Will communicate this to day shift nurse.

## 2011-11-11 ENCOUNTER — Inpatient Hospital Stay (HOSPITAL_COMMUNITY): Payer: Medicare Other

## 2011-11-11 ENCOUNTER — Encounter (HOSPITAL_COMMUNITY): Payer: Self-pay | Admitting: *Deleted

## 2011-11-11 ENCOUNTER — Inpatient Hospital Stay (HOSPITAL_COMMUNITY)
Admission: AD | Admit: 2011-11-11 | Discharge: 2011-11-12 | Disposition: A | Discharge status: Home / Self Care | Payer: Medicare Other | Source: Ambulatory Visit | Attending: Emergency Medicine | Admitting: Emergency Medicine

## 2011-11-11 DIAGNOSIS — R600 Localized edema: Secondary | ICD-10-CM

## 2011-11-11 DIAGNOSIS — I509 Heart failure, unspecified: Secondary | ICD-10-CM | POA: Insufficient documentation

## 2011-11-11 DIAGNOSIS — R609 Edema, unspecified: Secondary | ICD-10-CM | POA: Insufficient documentation

## 2011-11-11 DIAGNOSIS — N189 Chronic kidney disease, unspecified: Secondary | ICD-10-CM | POA: Insufficient documentation

## 2011-11-11 DIAGNOSIS — I129 Hypertensive chronic kidney disease with stage 1 through stage 4 chronic kidney disease, or unspecified chronic kidney disease: Secondary | ICD-10-CM | POA: Insufficient documentation

## 2011-11-11 DIAGNOSIS — E119 Type 2 diabetes mellitus without complications: Secondary | ICD-10-CM | POA: Insufficient documentation

## 2011-11-11 HISTORY — DX: Unspecified osteoarthritis, unspecified site: M19.90

## 2011-11-11 HISTORY — DX: Urinary tract infection, site not specified: N39.0

## 2011-11-11 LAB — CBC
Platelets: 298 10*3/uL (ref 150–400)
RDW: 15.8 % — ABNORMAL HIGH (ref 11.5–15.5)
WBC: 5.5 10*3/uL (ref 4.0–10.5)

## 2011-11-11 LAB — POCT I-STAT, CHEM 8
Calcium, Ion: 1.17 mmol/L (ref 1.12–1.32)
Chloride: 102 mEq/L (ref 96–112)
HCT: 38 % (ref 36.0–46.0)
Potassium: 3.4 mEq/L — ABNORMAL LOW (ref 3.5–5.1)
Sodium: 140 mEq/L (ref 135–145)

## 2011-11-11 LAB — PRO B NATRIURETIC PEPTIDE: Pro B Natriuretic peptide (BNP): 4164 pg/mL — ABNORMAL HIGH (ref 0–125)

## 2011-11-11 MED ORDER — ONDANSETRON HCL 4 MG/2ML IJ SOLN
4.0000 mg | Freq: Once | INTRAMUSCULAR | Status: AC
  Administered 2011-11-11: 4 mg via INTRAVENOUS
  Filled 2011-11-11: qty 2

## 2011-11-11 MED ORDER — NITROGLYCERIN 2 % TD OINT
1.0000 [in_us] | TOPICAL_OINTMENT | Freq: Once | TRANSDERMAL | Status: AC
  Administered 2011-11-11: 1 [in_us] via TOPICAL
  Filled 2011-11-11: qty 30

## 2011-11-11 MED ORDER — HYDROMORPHONE HCL PF 1 MG/ML IJ SOLN
1.0000 mg | Freq: Once | INTRAMUSCULAR | Status: AC
  Administered 2011-11-11: 1 mg via INTRAVENOUS
  Filled 2011-11-11: qty 1

## 2011-11-11 MED ORDER — FUROSEMIDE 10 MG/ML IJ SOLN
10.0000 mg | Freq: Once | INTRAMUSCULAR | Status: AC
  Administered 2011-11-11: 10 mg via INTRAVENOUS
  Filled 2011-11-11: qty 4

## 2011-11-11 NOTE — ED Notes (Signed)
ZOX:WRUE<AV> Expected date:11/11/11<BR> Expected time: 8:31 PM<BR> Means of arrival:Ambulance<BR> Comments:<BR> [facility], probable heart failure.

## 2011-11-11 NOTE — MAU Note (Signed)
IV attempted x1, blood return, 'blew' when started fluids.  CBG 166.

## 2011-11-11 NOTE — MAU Provider Note (Signed)
History     CSN: [id number]  Arrival date and time: 11/11/11 1847   First Provider Initiated Contact with Patient 11/11/11 1917      Chief Complaint  Patient presents with  . Foot Swelling  . Leg Pain   HPI Debra Stokes 36 y.o. Comes to MAU today with severe lower leg swelling and pain in her feet for one week.  Was discharged from [facility] one week ago (11-04-11) and swelling started then.  She had been admitted on 10-28-11 for a course of IV Vancomycin for abscesses in the left labia and left mons pubis.  Today the abscesses are not tender but continue to itch.  Client has numerous chronic health problems including hypertension, Type II DM, CHF, morbid obesity, and chronic renal insufficiency.  Her weight on 11-02-11 was 348 and today is 373 - 25 pound weight gain in 9 day.  Reports she has been taking her medication.  Tearful as her legs and feet hurt so much she is barely able to walk.  Has taken hydrocodone   OB History    Grav Para Term Preterm Abortions TAB SAB Ect Mult Living   1    1  1    0      Past Medical History  Diagnosis Date  . Diabetes mellitus   . Asthma   . CHF (congestive heart failure)   . Chronic back pain   . Migraine headache   . Hypertension   . Morbid obesity   . Polysubstance abuse   . Sleep apnea   . Drug-seeking behavior   . Urinary tract infection   . Arthritis     Past Surgical History  Procedure Date  . Cholecystectomy   . Cardiac catheterization     two, no blockage    Family History  Problem Relation Age of Onset  . Other Neg Hx     History  Substance Use Topics  . Smoking status: Current Everyday Smoker -- 0.5 packs/day for 16 years    Types: Cigarettes  . Smokeless tobacco: Not on file  . Alcohol Use: No     quit 5 yrs ago    Allergies: No Known Allergies  Prescriptions prior to admission  Medication Sig Dispense Refill  . albuterol (PROVENTIL HFA;VENTOLIN HFA) 108 (90 BASE) MCG/ACT inhaler Inhale 2 puffs into  the lungs every 6 (six) hours as needed. Asthma attacks      . doxycycline (VIBRA-TABS) 100 MG tablet Take 1 tablet (100 mg total) by mouth 2 (two) times daily.  28 tablet  0  . exenatide (BYETTA) 5 MCG/0.02ML SOLN Inject 0.02 mLs (5 mcg total) into the skin 2 (two) times daily with a meal.  1.2 mL  5  . hydrochlorothiazide (HYDRODIURIL) 50 MG tablet Take 1 tablet (50 mg total) by mouth daily.  30 tablet  3  . HYDROcodone-acetaminophen (NORCO) 5-325 MG per tablet Take 1-2 tablets by mouth every 6 (six) hours as needed for pain.  30 tablet  0  . ibuprofen (ADVIL,MOTRIN) 600 MG tablet Take 1 tablet (600 mg total) by mouth every 6 (six) hours as needed.  30 tablet  1  . insulin aspart (NOVOLOG) 100 UNIT/ML injection Inject 0-20 Units into the skin 3 (three) times daily with meals.  1 vial  5  . insulin aspart (NOVOLOG) 100 UNIT/ML injection Inject 0-5 Units into the skin at bedtime.  1 vial  5  . insulin glargine (LANTUS) 100 UNIT/ML injection Inject 40 Units into the  skin daily.  10 mL  5  . labetalol (NORMODYNE) 300 MG tablet Take 2 tablets (600 mg total) by mouth 3 (three) times daily.  180 tablet  3  . lisinopril (PRINIVIL,ZESTRIL) 10 MG tablet Take 1 tablet (10 mg total) by mouth daily.  30 tablet  3  . LORazepam (ATIVAN) 2 MG tablet Take 2 mg by mouth every 6 (six) hours as needed. For anxiety.         Review of Systems  Constitutional: Negative for fever.       Weight gain  Cardiovascular: Positive for leg swelling. Negative for chest pain.       Edema in lower legs and feet  Gastrointestinal: Negative for abdominal pain.  Genitourinary:       No pain in previous abscesses   Physical Exam   Blood pressure 213/144, pulse 86, temperature 98.4 F (36.9 C), temperature source Oral, resp. rate 24, height 5\' 5"  (1.651 m), weight 373 lb 6.4 oz (169.373 kg), last menstrual period 10/25/2011, SpO2 97.00%.  Physical Exam  Nursing note and vitals reviewed. Constitutional: She is oriented to  person, place, and time. She appears well-developed and well-nourished.  HENT:  Head: Normocephalic.  Eyes: EOM are normal.  Neck: Neck supple.  Cardiovascular: Regular rhythm and normal heart sounds.   Respiratory: Breath sounds normal.       O2 sat 100% when sitting and at times breathing appears labored; breathing even more labored when lying down.  GI: Soft. There is no tenderness. There is no rebound and no guarding.  Genitourinary:       Abscess site in left labia well healed and has firm area of less than 1 cm x 1 cm.  Site in left Mons at groin is larger - approx 5 cm x 3.5 cm but nontender to palpation.  No drainage.  Musculoskeletal: Normal range of motion.  Neurological: She is alert and oriented to person, place, and time.  Skin: Skin is warm and dry.  Psychiatric: She has a normal mood and affect.    MAU Course  Procedures  MDM Today's problem is edema in feet and legs which is likely related to cardiac and renal status.  Previous GYN problem is stable.   Consult with Dr. Lynelle Doctor at [facility] who accepts transfer for further evaluation.  Assessment and Plan  Edema in legs 25 pound weight gain in 9 days Multiple chronic health problems - elevated BP  Plan Transfer to [facility] ER for further evalution  Kamika Goodloe 11/11/2011, 7:46 PM

## 2011-11-11 NOTE — ED Notes (Signed)
Pt alert, [facility], c/o lower ext pain and swelling, recently seen at [facility] for labial abscess, resp even unlabored, skin pwd

## 2011-11-11 NOTE — MAU Note (Signed)
Patient states she has recently been in the hospital for boils on her vagina. States she has been having leg and feet pain with swelling since that time.

## 2011-11-11 NOTE — ED Notes (Signed)
Pt [facility]ial [facility]cesses, returned today for lower ext swelling, pain, resp even unlabored, lower ext pain, pedal edema, RA O2 sats

## 2011-11-11 NOTE — ED Provider Notes (Signed)
History     CSN: [id number]  Arrival date & time 11/11/11  1847   First MD Initiated Contact with Patient 11/11/11 2054      Chief Complaint  Patient presents with  . Foot Swelling  . Leg Pain    (Consider location/radiation/quality/duration/timing/severity/associated sxs/prior treatment) HPI Comments: patient with worsening lower extremity edema not controlled with home medications, Is currently being treated with antibiotics for labia abscesses   Patient is a 36 y.o. female presenting with leg pain. The history is provided by the patient.  Leg Pain  The incident occurred 2 days ago. The incident occurred at home. There was no injury mechanism. The pain is present in the left leg and right leg. The quality of the pain is described as aching. The pain is at a severity of 10/10. The pain is severe. The pain has been constant since onset. Pertinent negatives include no numbness, no inability to bear weight, no loss of motion, no muscle weakness, no loss of sensation and no tingling. The symptoms are aggravated by activity and bearing weight. She has tried nothing for the symptoms.    Past Medical History  Diagnosis Date  . Diabetes mellitus   . Asthma   . CHF (congestive heart failure)   . Chronic back pain   . Migraine headache   . Hypertension   . Morbid obesity   . Polysubstance abuse   . Sleep apnea   . Drug-seeking behavior   . Urinary tract infection   . Arthritis     Past Surgical History  Procedure Date  . Cholecystectomy   . Cardiac catheterization     two, no blockage    Family History  Problem Relation Age of Onset  . Other Neg Hx     History  Substance Use Topics  . Smoking status: Current Everyday Smoker -- 0.5 packs/day for 16 years    Types: Cigarettes  . Smokeless tobacco: Not on file  . Alcohol Use: No     quit 5 yrs ago    OB History    Grav Para Term Preterm Abortions TAB SAB Ect Mult Living   1    1  1    0      Review of Systems    Constitutional: Negative for fever and chills.  HENT: Negative for rhinorrhea.   Respiratory: Positive for chest tightness and shortness of breath.   Gastrointestinal: Negative for nausea, vomiting, diarrhea and constipation.  Musculoskeletal: Positive for joint swelling and gait problem.  Neurological: Positive for weakness. Negative for dizziness, tingling and numbness.    Allergies  Review of patient's allergies indicates no known allergies.  Home Medications   Current Outpatient Rx  Name Route Sig Dispense Refill  . ALBUTEROL SULFATE HFA 108 (90 BASE) MCG/ACT IN AERS Inhalation Inhale 2 puffs into the lungs every 6 (six) hours as needed. Asthma attacks    . DOXYCYCLINE HYCLATE 100 MG PO TABS Oral Take 1 tablet (100 mg total) by mouth 2 (two) times daily. 28 tablet 0  . EXENATIDE 5 MCG/0.02ML [place] SOLN Subcutaneous Inject 0.02 mLs (5 mcg total) into the skin 2 (two) times daily with a meal. 1.2 mL 5  . HYDROCHLOROTHIAZIDE 50 MG PO TABS Oral Take 1 tablet (50 mg total) by mouth daily. 30 tablet 3  . HYDROCODONE-ACETAMINOPHEN 5-325 MG PO TABS Oral Take 1-2 tablets by mouth every 6 (six) hours as needed for pain. 30 tablet 0  . IBUPROFEN 600 MG PO TABS Oral Take  1 tablet (600 mg total) by mouth every 6 (six) hours as needed. 30 tablet 1  . INSULIN ASPART 100 UNIT/ML [place] SOLN Subcutaneous Inject 0-20 Units into the skin 3 (three) times daily with meals. 1 vial 5  . INSULIN ASPART 100 UNIT/ML [place] SOLN Subcutaneous Inject 0-5 Units into the skin at bedtime. 1 vial 5  . INSULIN GLARGINE 100 UNIT/ML [place] SOLN Subcutaneous Inject 40 Units into the skin daily. 10 mL 5  . LABETALOL HCL 300 MG PO TABS Oral Take 2 tablets (600 mg total) by mouth 3 (three) times daily. 180 tablet 3  . LISINOPRIL 10 MG PO TABS Oral Take 1 tablet (10 mg total) by mouth daily. 30 tablet 3  . LORAZEPAM 2 MG PO TABS Oral Take 2 mg by mouth every 6 (six) hours as needed. For anxiety.     . FUROSEMIDE 20 MG PO TABS Oral Take 1  tablet (20 mg total) by mouth daily. 10 tablet 0    BP 160/99  Pulse 75  Temp 97.7 F (36.5 C) (Oral)  Resp 15  Ht 5\' 5"  (1.651 m)  Wt 373 lb 6.4 oz (169.373 kg)  BMI 62.14 kg/m2  SpO2 98%  LMP 10/25/2011  Physical Exam  Constitutional: She is oriented to person, place, and time. She appears well-developed and well-nourished.  HENT:  Head: Normocephalic.  Eyes: Pupils are equal, round, and reactive to light.  Cardiovascular: Normal rate.   Pulmonary/Chest: Effort normal and breath sounds normal. No respiratory distress. She has no wheezes. She exhibits no tenderness.  Abdominal:       Morbidly obese  Musculoskeletal: She exhibits edema and tenderness.  Neurological: She is alert and oriented to person, place, and time.  Skin: Skin is warm. No erythema.    ED Course  Procedures (including critical care time)  Labs Reviewed  CBC - Abnormal; Notable for the following:    Hemoglobin 11.4 (*)     HCT 35.3 (*)     RDW 15.8 (*)     All other components within normal limits  POCT I-STAT, CHEM 8 - Abnormal; Notable for the following:    Potassium 3.4 (*)     Creatinine, Ser 1.40 (*)     Glucose, Bld 174 (*)     All other components within normal limits  PRO B NATRIURETIC PEPTIDE - Abnormal; Notable for the following:    Pro B Natriuretic peptide (BNP) 4164.0 (*)     All other components within normal limits   Dg Chest Port 1 View  11/11/2011  *RADIOLOGY REPORT*  Clinical Data: Shortness of breath  PORTABLE CHEST - 1 VIEW  Comparison: 10/08/2011 CT  Findings: Degraded by patient body habitus and portable technique. Cardiomegaly.  Central vascular congestion.  Interstitial prominence may be accentuated by overlying soft tissues.  Cannot exclude a retrocardiac process.  No pneumothorax.  No definite pleural effusion.  No acute osseous finding.  IMPRESSION: Cardiomegaly with central vascular congestion.  Mild interstitial edema not excluded.  Original Report Authenticated By: Waneta Martins, M.D.     ED ECG REPORT   Date: 11/12/2011  EKG Time: 1:55 AM  Rate: 88  Rhythm: normal sinus rhythm,  unchanged from previous tracings  Axis: normal  Intervals:prolonged QT interval   Narrative Interpretation: abnormal   feeling better legs less tender/swollen patient agreeable to go home with short course of Lasix PO Has appointment with Dr. Mikeal Hawthorne on July 8th  MDM  Edema most likely due to the discontinuation of her diuretic         Arman Filter, NP 11/12/11 0155

## 2011-11-11 NOTE — MAU Note (Addendum)
Pain and swelling in lower legs started after discharge last Sun.   Not really having problems now with the boils- other than itching.

## 2011-11-11 NOTE — MAU Note (Signed)
Need for medically indicated transfer.  Pt states came here because these problems started after was treated here.

## 2011-11-12 MED ORDER — FUROSEMIDE 20 MG PO TABS: 20.0000 mg | ORAL_TABLET | Freq: Every day | ORAL | Status: DC

## 2011-11-12 NOTE — Discharge Instructions (Signed)
Edema Edema is a buildup of fluids. It is most common in the feet, ankles, and legs. This happens more as a person ages. It may affect one or both legs. HOME CARE   Raise (elevate) the legs or ankles above the level of the heart while lying down.   Avoid sitting or standing still for a long time.   Exercise the legs to help the puffiness (swelling) go down.   A low-salt diet may help lessen the puffiness.   Only take medicine as told by your doctor.  GET HELP RIGHT AWAY IF:   You develop shortness of breath or chest pain.   You cannot breathe when you lie down.   You have more puffiness that does not go away with treatment.   You develop pain or redness in the areas that are puffy.   You have a temperature by mouth above 102 F (38.9 C), not controlled by medicine.   You gain 3 lb/1.4 kg or more in 1 day or 5 lb/2.3 kg in a week.  MAKE SURE YOU:   Understand these instructions.   Will watch your condition.   Will get help right away if you are not doing well or get worse.  Document Released: 10/18/2007 Document Revised: 04/20/2011 Document Reviewed: 10/18/2007 [facility] Patient Information 2012 [place], [place]. You have been give a short course of Lasix You will need to have your creatinine rechecked by Dr. Mikeal Hawthorne to evaluation safety of continuing this medication long term

## 2011-11-13 LAB — GLUCOSE, CAPILLARY: Glucose-Capillary: 166 mg/dL — ABNORMAL HIGH (ref 70–99)

## 2011-11-13 NOTE — ED Provider Notes (Signed)
Medical screening examination/treatment/procedure(s) were performed by non-physician practitioner and as supervising physician I was immediately available for consultation/collaboration.  Bairon Klemann, MD 11/13/11 0045 

## 2011-11-23 ENCOUNTER — Ambulatory Visit: Payer: Medicare Other | Admitting: Obstetrics & Gynecology

## 2011-11-25 ENCOUNTER — Inpatient Hospital Stay (HOSPITAL_COMMUNITY)
Admission: EM | Admit: 2011-11-25 | Discharge: 2011-11-28 | DRG: 291 | Disposition: A | Discharge status: Home / Self Care | Payer: Medicare Other | Attending: Internal Medicine | Admitting: Internal Medicine

## 2011-11-25 ENCOUNTER — Emergency Department (HOSPITAL_COMMUNITY): Payer: Medicare Other

## 2011-11-25 DIAGNOSIS — E1142 Type 2 diabetes mellitus with diabetic polyneuropathy: Secondary | ICD-10-CM | POA: Diagnosis present

## 2011-11-25 DIAGNOSIS — R079 Chest pain, unspecified: Secondary | ICD-10-CM | POA: Diagnosis present

## 2011-11-25 DIAGNOSIS — I10 Essential (primary) hypertension: Secondary | ICD-10-CM

## 2011-11-25 DIAGNOSIS — Z9119 Patient's noncompliance with other medical treatment and regimen: Secondary | ICD-10-CM

## 2011-11-25 DIAGNOSIS — Z6841 Body Mass Index (BMI) 40.0 and over, adult: Secondary | ICD-10-CM

## 2011-11-25 DIAGNOSIS — K3184 Gastroparesis: Secondary | ICD-10-CM | POA: Diagnosis present

## 2011-11-25 DIAGNOSIS — N183 Chronic kidney disease, stage 3 unspecified: Secondary | ICD-10-CM | POA: Diagnosis present

## 2011-11-25 DIAGNOSIS — R590 Localized enlarged lymph nodes: Secondary | ICD-10-CM

## 2011-11-25 DIAGNOSIS — N764 Abscess of vulva: Secondary | ICD-10-CM

## 2011-11-25 DIAGNOSIS — F1410 Cocaine abuse, uncomplicated: Secondary | ICD-10-CM | POA: Diagnosis present

## 2011-11-25 DIAGNOSIS — D631 Anemia in chronic kidney disease: Secondary | ICD-10-CM | POA: Diagnosis present

## 2011-11-25 DIAGNOSIS — N039 Chronic nephritic syndrome with unspecified morphologic changes: Secondary | ICD-10-CM | POA: Diagnosis present

## 2011-11-25 DIAGNOSIS — J449 Chronic obstructive pulmonary disease, unspecified: Secondary | ICD-10-CM

## 2011-11-25 DIAGNOSIS — E118 Type 2 diabetes mellitus with unspecified complications: Secondary | ICD-10-CM

## 2011-11-25 DIAGNOSIS — E876 Hypokalemia: Secondary | ICD-10-CM | POA: Diagnosis present

## 2011-11-25 DIAGNOSIS — G4733 Obstructive sleep apnea (adult) (pediatric): Secondary | ICD-10-CM

## 2011-11-25 DIAGNOSIS — F1910 Other psychoactive substance abuse, uncomplicated: Secondary | ICD-10-CM

## 2011-11-25 DIAGNOSIS — J9620 Acute and chronic respiratory failure, unspecified whether with hypoxia or hypercapnia: Secondary | ICD-10-CM | POA: Diagnosis present

## 2011-11-25 DIAGNOSIS — I509 Heart failure, unspecified: Secondary | ICD-10-CM | POA: Diagnosis present

## 2011-11-25 DIAGNOSIS — Z91199 Patient's noncompliance with other medical treatment and regimen due to unspecified reason: Secondary | ICD-10-CM

## 2011-11-25 DIAGNOSIS — J4489 Other specified chronic obstructive pulmonary disease: Secondary | ICD-10-CM | POA: Diagnosis present

## 2011-11-25 DIAGNOSIS — I5033 Acute on chronic diastolic (congestive) heart failure: Principal | ICD-10-CM | POA: Diagnosis present

## 2011-11-25 DIAGNOSIS — Z720 Tobacco use: Secondary | ICD-10-CM | POA: Diagnosis present

## 2011-11-25 DIAGNOSIS — R221 Localized swelling, mass and lump, neck: Secondary | ICD-10-CM

## 2011-11-25 DIAGNOSIS — E1165 Type 2 diabetes mellitus with hyperglycemia: Secondary | ICD-10-CM

## 2011-11-25 DIAGNOSIS — F17200 Nicotine dependence, unspecified, uncomplicated: Secondary | ICD-10-CM | POA: Diagnosis present

## 2011-11-25 DIAGNOSIS — I129 Hypertensive chronic kidney disease with stage 1 through stage 4 chronic kidney disease, or unspecified chronic kidney disease: Secondary | ICD-10-CM | POA: Diagnosis present

## 2011-11-25 DIAGNOSIS — E1149 Type 2 diabetes mellitus with other diabetic neurological complication: Secondary | ICD-10-CM | POA: Diagnosis present

## 2011-11-25 DIAGNOSIS — Z8679 Personal history of other diseases of the circulatory system: Secondary | ICD-10-CM

## 2011-11-25 LAB — CBC WITH DIFFERENTIAL/PLATELET
Basophils Absolute: 0 10*3/uL (ref 0.0–0.1)
Basophils Relative: 0 % (ref 0–1)
MCHC: 31.4 g/dL (ref 30.0–36.0)
Neutro Abs: 3.7 10*3/uL (ref 1.7–7.7)
Neutrophils Relative %: 68 % (ref 43–77)
Platelets: 261 10*3/uL (ref 150–400)
RDW: 16 % — ABNORMAL HIGH (ref 11.5–15.5)
WBC: 5.5 10*3/uL (ref 4.0–10.5)

## 2011-11-25 LAB — PRO B NATRIURETIC PEPTIDE: Pro B Natriuretic peptide (BNP): 3786 pg/mL — ABNORMAL HIGH (ref 0–125)

## 2011-11-25 LAB — CARDIAC PANEL(CRET KIN+CKTOT+MB+TROPI)
CK, MB: 2.4 ng/mL (ref 0.3–4.0)
Relative Index: INVALID (ref 0.0–2.5)

## 2011-11-25 LAB — BASIC METABOLIC PANEL
Chloride: 98 mEq/L (ref 96–112)
Creatinine, Ser: 1.45 mg/dL — ABNORMAL HIGH (ref 0.50–1.10)
GFR calc Af Amer: 53 mL/min — ABNORMAL LOW (ref >90)
Potassium: 3 mEq/L — ABNORMAL LOW (ref 3.5–5.1)
Sodium: 136 mEq/L (ref 135–145)

## 2011-11-25 LAB — TROPONIN I: Troponin I: <0.3 ng/mL (ref <0.30)

## 2011-11-25 LAB — LIPID PANEL
Cholesterol: 128 mg/dL (ref 0–200)
HDL: 53 mg/dL (ref >39)
Total CHOL/HDL Ratio: 2.4 RATIO
Triglycerides: 130 mg/dL (ref <150)

## 2011-11-25 LAB — MAGNESIUM: Magnesium: 2 mg/dL (ref 1.5–2.5)

## 2011-11-25 LAB — HEMOGLOBIN A1C: Hgb A1c MFr Bld: 9 % — ABNORMAL HIGH (ref <5.7)

## 2011-11-25 MED ORDER — FUROSEMIDE 10 MG/ML IJ SOLN
40.0000 mg | Freq: Every day | INTRAMUSCULAR | Status: DC
  Administered 2011-11-26: 40 mg via INTRAVENOUS
  Filled 2011-11-25: qty 4

## 2011-11-25 MED ORDER — FENTANYL CITRATE 0.05 MG/ML IJ SOLN
50.0000 ug | Freq: Once | INTRAMUSCULAR | Status: AC
  Administered 2011-11-25: 50 ug via INTRAVENOUS
  Filled 2011-11-25: qty 2

## 2011-11-25 MED ORDER — ASPIRIN EC 81 MG PO TBEC
81.0000 mg | DELAYED_RELEASE_TABLET | Freq: Every day | ORAL | Status: DC
  Administered 2011-11-26 – 2011-11-28 (×3): 81 mg via ORAL
  Filled 2011-11-25 (×4): qty 1

## 2011-11-25 MED ORDER — ONDANSETRON HCL 4 MG/2ML IJ SOLN: 4.0000 mg | Freq: Four times a day (QID) | INTRAMUSCULAR | Status: DC | PRN

## 2011-11-25 MED ORDER — INSULIN ASPART 100 UNIT/ML ~~LOC~~ SOLN: 0.0000 [IU] | Freq: Every day | SUBCUTANEOUS | Status: DC

## 2011-11-25 MED ORDER — MORPHINE SULFATE 2 MG/ML IJ SOLN: 1.0000 mg | INTRAMUSCULAR | Status: DC | PRN

## 2011-11-25 MED ORDER — POTASSIUM CHLORIDE CRYS ER 20 MEQ PO TBCR
40.0000 meq | EXTENDED_RELEASE_TABLET | Freq: Two times a day (BID) | ORAL | Status: AC
  Administered 2011-11-25 – 2011-11-26 (×2): 40 meq via ORAL
  Filled 2011-11-25 (×2): qty 2

## 2011-11-25 MED ORDER — INSULIN ASPART 100 UNIT/ML ~~LOC~~ SOLN: 0.0000 [IU] | Freq: Three times a day (TID) | SUBCUTANEOUS | Status: DC

## 2011-11-25 MED ORDER — SODIUM CHLORIDE 0.9 % IJ SOLN
3.0000 mL | Freq: Two times a day (BID) | INTRAMUSCULAR | Status: DC
  Administered 2011-11-25 – 2011-11-26 (×2): 3 mL via INTRAVENOUS

## 2011-11-25 MED ORDER — ENOXAPARIN SODIUM 30 MG/0.3ML ~~LOC~~ SOLN
30.0000 mg | SUBCUTANEOUS | Status: DC
  Administered 2011-11-25: 30 mg via SUBCUTANEOUS
  Filled 2011-11-25 (×2): qty 0.3

## 2011-11-25 MED ORDER — EXENATIDE 5 MCG/0.02ML ~~LOC~~ SOPN
5.0000 ug | PEN_INJECTOR | Freq: Two times a day (BID) | SUBCUTANEOUS | Status: DC
  Administered 2011-11-27 – 2011-11-28 (×2): 5 ug via SUBCUTANEOUS

## 2011-11-25 MED ORDER — LISINOPRIL 10 MG PO TABS
10.0000 mg | ORAL_TABLET | Freq: Every day | ORAL | Status: DC
  Administered 2011-11-25 – 2011-11-27 (×3): 10 mg via ORAL
  Filled 2011-11-25 (×3): qty 1

## 2011-11-25 MED ORDER — INSULIN GLARGINE 100 UNIT/ML ~~LOC~~ SOLN
40.0000 [IU] | Freq: Every day | SUBCUTANEOUS | Status: DC
  Administered 2011-11-25 – 2011-11-27 (×3): 40 [IU] via SUBCUTANEOUS
  Filled 2011-11-25: qty 1

## 2011-11-25 MED ORDER — HYDROCHLOROTHIAZIDE 50 MG PO TABS
50.0000 mg | ORAL_TABLET | Freq: Every day | ORAL | Status: DC
  Administered 2011-11-25 – 2011-11-27 (×3): 50 mg via ORAL
  Filled 2011-11-25 (×3): qty 1

## 2011-11-25 MED ORDER — ASPIRIN 81 MG PO CHEW
CHEWABLE_TABLET | ORAL | Status: AC
  Filled 2011-11-25: qty 1

## 2011-11-25 MED ORDER — INSULIN ASPART 100 UNIT/ML ~~LOC~~ SOLN: 3.0000 [IU] | Freq: Every day | SUBCUTANEOUS | Status: DC

## 2011-11-25 MED ORDER — INSULIN ASPART 100 UNIT/ML ~~LOC~~ SOLN
0.0000 [IU] | SUBCUTANEOUS | Status: DC
  Administered 2011-11-25: 2 [IU] via SUBCUTANEOUS
  Administered 2011-11-25: 3 [IU] via SUBCUTANEOUS
  Administered 2011-11-26 (×3): 2 [IU] via SUBCUTANEOUS
  Administered 2011-11-26: 1 [IU] via SUBCUTANEOUS
  Administered 2011-11-26: 2 [IU] via SUBCUTANEOUS
  Administered 2011-11-27: 1 [IU] via SUBCUTANEOUS
  Administered 2011-11-27 (×2): 2 [IU] via SUBCUTANEOUS
  Administered 2011-11-27: 3 [IU] via SUBCUTANEOUS
  Administered 2011-11-27 – 2011-11-28 (×2): 2 [IU] via SUBCUTANEOUS
  Administered 2011-11-28: 1 [IU] via SUBCUTANEOUS
  Administered 2011-11-28: 2 [IU] via SUBCUTANEOUS

## 2011-11-25 MED ORDER — SODIUM CHLORIDE 0.9 % IJ SOLN: 3.0000 mL | INTRAMUSCULAR | Status: DC | PRN

## 2011-11-25 MED ORDER — HYDROMORPHONE HCL PF 1 MG/ML IJ SOLN
1.0000 mg | INTRAMUSCULAR | Status: DC | PRN
  Administered 2011-11-25 – 2011-11-28 (×18): 1 mg via INTRAVENOUS
  Filled 2011-11-25 (×20): qty 1

## 2011-11-25 MED ORDER — ONDANSETRON HCL 4 MG PO TABS: 4.0000 mg | ORAL_TABLET | Freq: Four times a day (QID) | ORAL | Status: DC | PRN

## 2011-11-25 MED ORDER — FENTANYL CITRATE 0.05 MG/ML IJ SOLN
100.0000 ug | Freq: Once | INTRAMUSCULAR | Status: AC
  Administered 2011-11-25: 100 ug via INTRAVENOUS
  Filled 2011-11-25: qty 2

## 2011-11-25 MED ORDER — ALBUTEROL SULFATE HFA 108 (90 BASE) MCG/ACT IN AERS: 2.0000 | INHALATION_SPRAY | Freq: Four times a day (QID) | RESPIRATORY_TRACT | Status: DC | PRN

## 2011-11-25 MED ORDER — LABETALOL HCL 300 MG PO TABS
600.0000 mg | ORAL_TABLET | Freq: Three times a day (TID) | ORAL | Status: DC
  Administered 2011-11-25 – 2011-11-28 (×8): 600 mg via ORAL
  Filled 2011-11-25 (×10): qty 2

## 2011-11-25 MED ORDER — FUROSEMIDE 10 MG/ML IJ SOLN
40.0000 mg | Freq: Once | INTRAMUSCULAR | Status: AC
  Administered 2011-11-25: 40 mg via INTRAVENOUS
  Filled 2011-11-25: qty 4

## 2011-11-25 MED ORDER — SODIUM CHLORIDE 0.9 % IV SOLN: 250.0000 mL | INTRAVENOUS | Status: DC | PRN

## 2011-11-25 MED ORDER — EXENATIDE 5 MCG/0.02ML ~~LOC~~ SOPN
5.0000 ug | PEN_INJECTOR | Freq: Two times a day (BID) | SUBCUTANEOUS | Status: DC
  Filled 2011-11-25: qty 1.2

## 2011-11-25 MED ORDER — LORAZEPAM 1 MG PO TABS
1.0000 mg | ORAL_TABLET | Freq: Four times a day (QID) | ORAL | Status: DC | PRN
  Administered 2011-11-26 – 2011-11-27 (×3): 1 mg via ORAL
  Filled 2011-11-25 (×4): qty 1

## 2011-11-25 MED ORDER — SODIUM CHLORIDE 0.9 % IJ SOLN
3.0000 mL | Freq: Two times a day (BID) | INTRAMUSCULAR | Status: DC
  Administered 2011-11-25 – 2011-11-28 (×7): 3 mL via INTRAVENOUS

## 2011-11-25 NOTE — ED Provider Notes (Signed)
History     CSN: [id number]  Arrival date & time 11/25/11  1339   First MD Initiated Contact with Patient 11/25/11 1500      Chief Complaint  Patient presents with  . Leg Swelling    (Consider location/radiation/quality/duration/timing/severity/associated sxs/prior treatment) The history is provided by the patient.   patient presents with shortness of breath and chest pain. She states she's been out of her Lasix. She states that her doctors in [place] and she cannot get her medications. She states her legs hurt due to the pain. She states that her chest hurts when she breathes. She's a history of CHF with medication noncompliance. No cough. No fevers. Patient is morbidly obese.   Past Medical History  Diagnosis Date  . Diabetes mellitus   . Asthma   . CHF (congestive heart failure)   . Chronic back pain   . Migraine headache   . Hypertension   . Morbid obesity   . Polysubstance abuse   . Sleep apnea   . Drug-seeking behavior   . Urinary tract infection   . Arthritis     Past Surgical History  Procedure Date  . Cholecystectomy   . Cardiac catheterization     two, no blockage    Family History  Problem Relation Age of Onset  . Other Neg Hx     History  Substance Use Topics  . Smoking status: Current Everyday Smoker -- 0.5 packs/day for 16 years    Types: Cigarettes  . Smokeless tobacco: Not on file  . Alcohol Use: No     quit 5 yrs ago    OB History    Grav Para Term Preterm Abortions TAB SAB Ect Mult Living   1    1  1    0      Review of Systems  Constitutional: Negative for fatigue.  Respiratory: Positive for shortness of breath. Negative for cough.   Cardiovascular: Positive for chest pain.  Gastrointestinal: Negative for abdominal pain.  Musculoskeletal: Negative for back pain.  Neurological: Negative for light-headedness.  Psychiatric/Behavioral: Negative for behavioral problems.    Allergies  Review of patient's allergies indicates no known  allergies.  Home Medications   No current outpatient prescriptions on file.  BP 176/111  Pulse 88  Temp 97.6 F (36.4 C) (Oral)  Resp 18  SpO2 100%  LMP 11/23/2011  Physical Exam  Nursing note and vitals reviewed. Constitutional: She is oriented to person, place, and time. She appears well-developed and well-nourished.       Patient is morbidly obese.  HENT:  Head: Normocephalic and atraumatic.  Eyes: EOM are normal. Pupils are equal, round, and reactive to light.  Neck: Normal range of motion. Neck supple.  Cardiovascular: Normal rate, regular rhythm and normal heart sounds.   No murmur heard. Pulmonary/Chest: Effort normal and breath sounds normal. No respiratory distress. She has no wheezes. She has no rales.       Patient is morbidly obese and has a poor lung exam due to body habitus.  Abdominal: Soft. Bowel sounds are normal. She exhibits no distension. There is no tenderness. There is no rebound and no guarding.  Musculoskeletal: Normal range of motion. She exhibits edema.       Bilateral lower extremity pitting edema.  Neurological: She is alert and oriented to person, place, and time. No cranial nerve deficit.  Skin: Skin is warm and dry.  Psychiatric: She has a normal mood and affect. Her speech is normal.  ED Course  Procedures (including critical care time)  Labs Reviewed  CBC WITH DIFFERENTIAL - Abnormal; Notable for the following:    Hemoglobin 11.7 (*)     RDW 16.0 (*)     All other components within normal limits  BASIC METABOLIC PANEL - Abnormal; Notable for the following:    Potassium 3.0 (*)     Glucose, Bld 254 (*)     Creatinine, Ser 1.45 (*)     GFR calc non Af Amer 46 (*)     GFR calc Af Amer 53 (*)     All other components within normal limits  PRO B NATRIURETIC PEPTIDE - Abnormal; Notable for the following:    Pro B Natriuretic peptide (BNP) 3786.0 (*)     All other components within normal limits  HEMOGLOBIN A1C - Abnormal; Notable for  the following:    Hemoglobin A1C 9.0 (*)     Mean Plasma Glucose 212 (*)     All other components within normal limits  GLUCOSE, CAPILLARY - Abnormal; Notable for the following:    Glucose-Capillary 181 (*)     All other components within normal limits  MRSA PCR SCREENING - Abnormal; Notable for the following:    MRSA by PCR POSITIVE (*)     All other components within normal limits  GLUCOSE, CAPILLARY - Abnormal; Notable for the following:    Glucose-Capillary 231 (*)     All other components within normal limits  TROPONIN I  MAGNESIUM  PHOSPHORUS  TSH  CARDIAC PANEL(CRET KIN+CKTOT+MB+TROPI)  LIPID PANEL  CARDIAC PANEL(CRET KIN+CKTOT+MB+TROPI)  BASIC METABOLIC PANEL  CBC  URINE RAPID DRUG SCREEN (HOSP PERFORMED)   Dg Chest 2 View  11/25/2011  *RADIOLOGY REPORT*  Clinical Data: Chest pain and shortness of breath.  CHF.  Asthma. Smoker.  CHEST - 2 VIEW  Comparison: 11/11/2011  Findings: The lateral view is mildly motion degraded.  The frontal view is mildly degraded by patient body habitus. Moderate cardiomegaly.  No definite pleural effusion. No pneumothorax.  Mild interstitial edema. No lobar consolidation.  IMPRESSION: Cardiomegaly with mild congestive heart failure.  Decreased sensitivity and specificity exam due to technique related factors, as described above.  Original Report Authenticated By: Consuello Bossier, M.D.     1. CHF (congestive heart failure)   2. Chronic renal insufficiency, stage III (moderate)   3. COPD (chronic obstructive pulmonary disease)   4. Diabetes mellitus type 2 with complications, uncontrolled      Date: 11/25/2011  Rate: 90  Rhythm: normal sinus rhythm  QRS Axis: normal  Intervals: normal  ST/T Wave abnormalities: nonspecific T wave changes  Conduction Disutrbances:none  Narrative Interpretation:   Old EKG Reviewed: unchanged    MDM  Patient with shortness of breath and CHF. History same. She has had some medication noncompliance with her  Lasix. She'll be admitted to medicine for further workup. Admitting Dr. requested that they choose to medications for admission.        Juliet Rude. Rubin Payor, MD 11/25/11 1610

## 2011-11-25 NOTE — H&P (Addendum)
[facility] History and Physical  Debra Stokes ZOX:[id number] DOB: [birth year] DOA: 11/25/2011  Referring physician: ED physician PCP: Lonell Face, MD   Chief Complaint: Chest pain and shortness of breath  HPI:  Pt is 36 yo female who presents with substernal, intermittent and pressure like chest pain that started the morning of admission, 7/10 in severity when present, non radiating but associated with shortness of breath, with no specific aggravating or alleviating factors. She denies similar episodes in the past but reports running out of Lasix and her PCP is out of town and she was not able to obtain Lasix.  Review of Systems:   Constitutional: Negative for fever, chills and malaise/fatigue. Negative for diaphoresis.  HENT: Negative for hearing loss, ear pain, nosebleeds, congestion, sore throat, neck pain, tinnitus and ear discharge.   Eyes: Negative for blurred vision, double vision, photophobia, pain, discharge and redness.  Respiratory: Negative for cough, hemoptysis, sputum production, positive for shortness of breath   Cardiovascular: Positive for chest pain, negative for palpitations, orthopnea, claudication and leg swelling.  Gastrointestinal: Negative for nausea, vomiting and abdominal pain. Negative for heartburn, constipation, blood in stool and melena.  Genitourinary: Negative for dysuria, urgency, frequency, hematuria and flank pain.  Musculoskeletal: Negative for myalgias, back pain, joint pain and falls.  Skin: Negative for itching and rash.  Neurological: Negative for dizziness and weakness. Negative for tingling, tremors, sensory change, speech change, focal weakness, loss of consciousness and headaches.  Endo/Heme/Allergies: Negative for environmental allergies and polydipsia. Does not bruise/bleed easily.  Psychiatric/Behavioral: Negative for suicidal ideas. The patient is not nervous/anxious.      Past Medical History  Diagnosis Date  . Diabetes  mellitus   . Asthma   . CHF (congestive heart failure)   . Chronic back pain   . Migraine headache   . Hypertension   . Morbid obesity   . Polysubstance abuse   . Sleep apnea   . Drug-seeking behavior   . Urinary tract infection   . Arthritis    Past Surgical History  Procedure Date  . Cholecystectomy   . Cardiac catheterization     two, no blockage   Social History:  reports that she has been smoking Cigarettes.  She has a 8 pack-year smoking history. She does not have any smokeless tobacco history on file. She reports that she does not drink alcohol or use illicit drugs.  No Known Allergies  Family History  Problem Relation Age of Onset  . Other Neg Hx     Prior to Admission medications   Medication Sig Start Date End Date Taking? Authorizing Provider  albuterol (PROVENTIL HFA;VENTOLIN HFA) 108 (90 BASE) MCG/ACT inhaler Inhale 2 puffs into the lungs every 6 (six) hours as needed. Asthma attacks   Yes Historical Provider, MD  exenatide (BYETTA) 5 MCG/0.02ML SOLN Inject 0.02 mLs (5 mcg total) into the skin 2 (two) times daily with a meal. 11/04/11  Yes Peggy Constant, MD  furosemide (LASIX) 20 MG tablet Take 1 tablet (20 mg total) by mouth daily. 11/12/11 11/11/12 Yes Arman Filter, NP  hydrochlorothiazide (HYDRODIURIL) 50 MG tablet Take 1 tablet (50 mg total) by mouth daily. 11/04/11 11/03/12 Yes Peggy Constant, MD  insulin aspart (NOVOLOG) 100 UNIT/ML injection Inject 0-20 Units into the skin 3 (three) times daily with meals. 11/04/11 11/03/12 Yes Peggy Constant, MD  insulin aspart (NOVOLOG) 100 UNIT/ML injection Inject 3-20 Units into the skin at bedtime. 11/04/11 11/03/12 Yes Peggy Constant, MD  insulin glargine (LANTUS) 100 UNIT/ML  injection Inject 40 Units into the skin daily. 11/04/11 11/03/12 Yes Peggy Constant, MD  labetalol (NORMODYNE) 300 MG tablet Take 2 tablets (600 mg total) by mouth 3 (three) times daily. 11/04/11 11/03/12 Yes Peggy Constant, MD  lisinopril (PRINIVIL,ZESTRIL)  10 MG tablet Take 1 tablet (10 mg total) by mouth daily. 11/04/11 11/03/12 Yes Peggy Constant, MD  LORazepam (ATIVAN) 2 MG tablet Take 2 mg by mouth every 6 (six) hours as needed. For anxiety.    Yes Historical Provider, MD   Physical Exam: Filed Vitals:   11/25/11 1420 11/25/11 1424 11/25/11 1426 11/25/11 1722  BP: 187/106   201/116  Pulse:  90  87  Temp: 98.8 F (37.1 C)     Resp:    18  SpO2:  83% 91% 100%    Physical Exam  Constitutional: Appears well-developed and well-nourished. No distress.  HENT: Normocephalic. External right and left ear normal. Oropharynx is clear and moist.  Eyes: Conjunctivae and EOM are normal. PERRLA, no scleral icterus.  Neck: Normal ROM. Neck supple. No JVD. No tracheal deviation. No thyromegaly.  CVS: RRR, S1/S2 +, no murmurs, no gallops, no carotid bruit.  Pulmonary: Bibasilar crackles, no wheezing, no stridor Abdominal: Soft. BS +,  no distension, tenderness, rebound or guarding.  Musculoskeletal: Normal range of motion. Bilateral pitting lower extremity edema + 1 Lymphadenopathy: No lymphadenopathy noted, cervical, inguinal. Neuro: Alert. Normal reflexes, muscle tone coordination. No cranial nerve deficit. Skin: Skin is warm and dry. No rash noted. Not diaphoretic. No erythema. No pallor.  Psychiatric: Normal mood and affect. Behavior, judgment, thought content normal.   Labs on Admission:  Basic Metabolic Panel:  Lab 11/25/11 1610  NA 136  K 3.0*  CL 98  CO2 27  GLUCOSE 254*  BUN 18  CREATININE 1.45*  CALCIUM 9.2  MG --  PHOS --   CBC:  Lab 11/25/11 1520  WBC 5.5  NEUTROABS 3.7  HGB 11.7*  HCT 37.3  MCV 90.1  PLT 261   Cardiac Enzymes:  Lab 11/25/11 1525  CKTOTAL --  CKMB --  CKMBINDEX --  TROPONINI <0.30    Radiological Exams on Admission: Dg Chest 2 View  11/25/2011  *RADIOLOGY REPORT*  Clinical Data: Chest pain and shortness of breath.  CHF.  Asthma. Smoker.  CHEST - 2 VIEW  Comparison: 11/11/2011  Findings: The  lateral view is mildly motion degraded.  The frontal view is mildly degraded by patient body habitus. Moderate cardiomegaly.  No definite pleural effusion. No pneumothorax.  Mild interstitial edema. No lobar consolidation.  IMPRESSION: Cardiomegaly with mild congestive heart failure.  Decreased sensitivity and specificity exam due to technique related factors, as described above.  Original Report Authenticated By: Consuello Bossier, M.D.    EKG: Normal sinus rhythm, no ST/T wave changes  Assessment/Plan  Chest pain - unclear etiology and will have to admit to tele for further evaluation and management - will obtain CE's and TSH, along with UDS - treat symptomatically with analgesia, aspirin and oxygen as needed - 12 lead EKG in AM - obtain A1C, lipid panel, monitor BP and readjust the medications as indicated  HTN, accelerated - will continue home medication regimen for now - this includes Lisinopril and Labetalol - monitor vitals per floor protocol  Acute renal failure - continue Lasix and obtain BMP in AM - will adjust the dose of Lasix as indicated  Hypokalemia - will supplement as indicated - will check Mg level - BMP in AM  Diabetes - will  check A1C and will continue home medication regimen  Code Status: Full Family Communication: Pt at bedside Disposition Plan: Home when stable  [name], MD  [facility] Pager [phone]  If 7PM-7AM, please contact night-coverage [email or web address] Password [facility] 11/25/2011, 5:35 PM

## 2011-11-25 NOTE — ED Notes (Signed)
Pt w/ known Hx of CHF, currently out of Lasix. Presents w/ feet, leg edema onset Wednesday of this week. States "feels like fluid is building around my heart", can't take a full breath. Denies previous MI, morbidly obese

## 2011-11-26 ENCOUNTER — Encounter (HOSPITAL_COMMUNITY): Payer: Self-pay | Admitting: *Deleted

## 2011-11-26 DIAGNOSIS — I509 Heart failure, unspecified: Secondary | ICD-10-CM | POA: Diagnosis present

## 2011-11-26 DIAGNOSIS — E876 Hypokalemia: Secondary | ICD-10-CM | POA: Diagnosis present

## 2011-11-26 DIAGNOSIS — R079 Chest pain, unspecified: Secondary | ICD-10-CM | POA: Diagnosis present

## 2011-11-26 LAB — BASIC METABOLIC PANEL
BUN: 19 mg/dL (ref 6–23)
Chloride: 97 mEq/L (ref 96–112)
Creatinine, Ser: 1.73 mg/dL — ABNORMAL HIGH (ref 0.50–1.10)
GFR calc Af Amer: 43 mL/min — ABNORMAL LOW (ref >90)
Glucose, Bld: 182 mg/dL — ABNORMAL HIGH (ref 70–99)
Potassium: 3.2 mEq/L — ABNORMAL LOW (ref 3.5–5.1)

## 2011-11-26 LAB — CBC
HCT: 34.3 % — ABNORMAL LOW (ref 36.0–46.0)
Hemoglobin: 10.7 g/dL — ABNORMAL LOW (ref 12.0–15.0)
MCH: 28.3 pg (ref 26.0–34.0)
MCHC: 31.2 g/dL (ref 30.0–36.0)
MCV: 90.7 fL (ref 78.0–100.0)
Platelets: 233 10*3/uL (ref 150–400)
RBC: 3.78 MIL/uL — ABNORMAL LOW (ref 3.87–5.11)
RDW: 16.3 % — ABNORMAL HIGH (ref 11.5–15.5)
WBC: 5.9 10*3/uL (ref 4.0–10.5)

## 2011-11-26 LAB — GLUCOSE, CAPILLARY
Glucose-Capillary: 182 mg/dL — ABNORMAL HIGH (ref 70–99)
Glucose-Capillary: 175 mg/dL — ABNORMAL HIGH (ref 70–99)
Glucose-Capillary: 158 mg/dL — ABNORMAL HIGH (ref 70–99)

## 2011-11-26 LAB — CARDIAC PANEL(CRET KIN+CKTOT+MB+TROPI)
CK, MB: 2.3 ng/mL (ref 0.3–4.0)
Relative Index: INVALID (ref 0.0–2.5)
Total CK: 88 U/L (ref 7–177)
Troponin I: <0.3 ng/mL (ref <0.30)

## 2011-11-26 MED ORDER — ENOXAPARIN SODIUM 40 MG/0.4ML ~~LOC~~ SOLN
40.0000 mg | SUBCUTANEOUS | Status: DC
  Administered 2011-11-26 – 2011-11-27 (×2): 40 mg via SUBCUTANEOUS
  Filled 2011-11-26 (×3): qty 0.4

## 2011-11-26 MED ORDER — MUPIROCIN 2 % EX OINT
1.0000 "application " | TOPICAL_OINTMENT | Freq: Two times a day (BID) | CUTANEOUS | Status: DC
  Administered 2011-11-26 – 2011-11-28 (×5): 1 via NASAL
  Filled 2011-11-26: qty 22

## 2011-11-26 MED ORDER — FUROSEMIDE 40 MG PO TABS
40.0000 mg | ORAL_TABLET | Freq: Every day | ORAL | Status: DC
  Administered 2011-11-27 – 2011-11-28 (×2): 40 mg via ORAL
  Filled 2011-11-26 (×2): qty 1

## 2011-11-26 MED ORDER — POTASSIUM CHLORIDE CRYS ER 20 MEQ PO TBCR
40.0000 meq | EXTENDED_RELEASE_TABLET | Freq: Every day | ORAL | Status: DC
  Administered 2011-11-26 – 2011-11-28 (×2): 40 meq via ORAL
  Filled 2011-11-26 (×3): qty 2

## 2011-11-26 MED ORDER — HYDRALAZINE HCL 25 MG PO TABS
25.0000 mg | ORAL_TABLET | Freq: Three times a day (TID) | ORAL | Status: DC
Start: 2011-11-26 — End: 2011-11-28
  Administered 2011-11-26 – 2011-11-28 (×6): 25 mg via ORAL
  Filled 2011-11-26 (×9): qty 1

## 2011-11-26 MED ORDER — AMLODIPINE BESYLATE 10 MG PO TABS
10.0000 mg | ORAL_TABLET | Freq: Every day | ORAL | Status: DC
  Administered 2011-11-26 – 2011-11-28 (×3): 10 mg via ORAL
  Filled 2011-11-26 (×3): qty 1

## 2011-11-26 MED ORDER — CHLORHEXIDINE GLUCONATE CLOTH 2 % EX PADS
6.0000 | MEDICATED_PAD | Freq: Every day | CUTANEOUS | Status: DC
  Administered 2011-11-27 – 2011-11-28 (×2): 6 via TOPICAL

## 2011-11-26 MED ORDER — OXYCODONE-ACETAMINOPHEN 5-325 MG PO TABS
2.0000 | ORAL_TABLET | Freq: Once | ORAL | Status: AC
  Administered 2011-11-26: 2 via ORAL
  Filled 2011-11-26: qty 2

## 2011-11-26 NOTE — Progress Notes (Signed)
Discussed HF with patient, daily weights, diet and warning signs.

## 2011-11-26 NOTE — Progress Notes (Signed)
Patient is allowing multiple family members in the room without gowning up. There have been several small children visiting, and I advised the parents the importance of everyone wearing PPE because patient has MRSA. They have refused to wear the clothing nevertheless.

## 2011-11-26 NOTE — Progress Notes (Signed)
Patient ID: Debra Stokes, female   DOB: 08/30/1975, 36 y.o.   MRN: [id number]  [facility] PROGRESS NOTE  Kamy Poinsett WUJ:[id number] DOB: 03-22-1976 DOA: 11/25/2011 PCP: Lonell Face, MD  Brief narrative: Pt is 36 yo female who was admitted 11/25/2011 for evaluation of chest pain and shortness of breath. She is currently being worked up for acute CHF exacerbation which was determined to be secondary to medical non compliance.   Principal Problem:  *CHF (congestive heart failure) - per last 2 D ECHO 2012 pt had combined systolic and diastolic CHF - she was supposed to be on Lasix but due to medical noncompliance she has now been off medication for over 2 weeks - she reports clinical improvement this AM but still short of breath with even minimal exertion - will plan on getting 2 D ECHO in AM, follow up on daily weights, I's and O's - change Lasix to PO - BMP in AM   Chest pain - CE's x 3 negative and pain resolved this AM - UDS pending - continue supportive care  Active Problems:  Diabetes mellitus type 2 with complications, uncontrolled - check A1C - continue insulin and readjust the dosing as indicated  Hypokalemia - secondary to diuresis - supplement - BMP in AM   Hypertension, accelerated - increase the dose of Labetalol, add Hydralazine   Morbid obesity with BMI of 50.0-59.9, adult - dietary recommendations provided along with physical activity    Chronic renal insufficiency, stage III (moderate) - creatinine up from yesterday - will lower doe of Lasix - BMP in AM   Polysubstance abuse - UDS pending   COPD (chronic obstructive pulmonary disease) - continue supportive care   Tobacco use - consultation on cessation provided  Consultants:  None  Procedures:  None  Antibiotics:  None  HPI/Subjective: No events overnight.   Objective: Filed Vitals:   11/25/11 1426 11/25/11 1722 11/25/11 2050 11/26/11 0526  BP:  201/116 176/111 156/108    Pulse:  87 88 75  Temp:   97.6 F (36.4 C) 97.9 F (36.6 C)  TempSrc:   Oral Oral  Resp:  18 18 18   Height:   5\' 5"  (1.651 m)   Weight:   169.37 kg (373 lb 6.3 oz)   SpO2: 91% 100% 100% 98%    Intake/Output Summary (Last 24 hours) at 11/26/11 1258 Last data filed at 11/26/11 0653  Gross per 24 hour  Intake      0 ml  Output    450 ml  Net   -450 ml    Exam:   General:  Pt is alert, follows commands appropriately, not in acute distress, morbidly obese  Cardiovascular: Regular rate and rhythm, S1/S2, no murmurs, no rubs, no gallops  Respiratory: Decreased breath sounds and exam limited due to body habitus  Abdomen: Soft, non tender, non distended, bowel sounds present, no guarding  Extremities: No edema, pulses DP and PT palpable bilaterally  Neuro: Grossly nonfocal  Data Reviewed: Basic Metabolic Panel:  Lab 11/26/11 9562 11/25/11 1744 11/25/11 1520  NA 138 -- 136  K 3.2* -- 3.0*  CL 97 -- 98  CO2 30 -- 27  GLUCOSE 182* -- 254*  BUN 19 -- 18  CREATININE 1.73* -- 1.45*  CALCIUM 9.0 -- 9.2  MG -- 2.0 --  PHOS -- 3.6 --   CBC:  Lab 11/26/11 0138 11/25/11 1520  WBC 5.9 5.5  NEUTROABS -- 3.7  HGB 10.7* 11.7*  HCT 34.3* 37.3  MCV  90.7 90.1  PLT 233 261   Cardiac Enzymes:  Lab 11/26/11 0138 11/25/11 1740 11/25/11 1525  CKTOTAL 88 98 --  CKMB 2.3 2.4 --  CKMBINDEX -- -- --  TROPONINI <0.30 <0.30 <0.30   CBG:  Lab 11/26/11 1134 11/26/11 0732 11/26/11 0406 11/25/11 2259 11/25/11 1958  GLUCAP 175* 182* 195* 231* 181*    Recent Results (from the past 240 hour(s))  MRSA PCR SCREENING     Status: Abnormal   Collection Time   11/25/11  9:01 PM      Component Value Range Status Comment   MRSA by PCR POSITIVE (*) NEGATIVE Final      Studies: Dg Chest 2 View  11/25/2011  *RADIOLOGY REPORT*  Clinical Data: Chest pain and shortness of breath.  CHF.  Asthma. Smoker.  CHEST - 2 VIEW  Comparison: 11/11/2011  Findings: The lateral view is mildly motion  degraded.  The frontal view is mildly degraded by patient body habitus. Moderate cardiomegaly.  No definite pleural effusion. No pneumothorax.  Mild interstitial edema. No lobar consolidation.  IMPRESSION: Cardiomegaly with mild congestive heart failure.  Decreased sensitivity and specificity exam due to technique related factors, as described above.  Original Report Authenticated By: Consuello Bossier, M.D.    Scheduled Meds:   . aspirin EC  81 mg Oral Daily  . enoxaparin (LOVENOX) injection  40 mg Subcutaneous Q24H  . exenatide  5 mcg Subcutaneous BID WC  . fentaNYL  100 mcg Intravenous Once  . fentaNYL  50 mcg Intravenous Once  . furosemide  40 mg Intravenous Once  . furosemide  40 mg Intravenous Daily  . hydrochlorothiazide  50 mg Oral Daily  . insulin aspart  0-9 Units Subcutaneous Q4H  . insulin glargine  40 Units Subcutaneous Daily  . labetalol  600 mg Oral TID  . lisinopril  10 mg Oral Daily  . oxyCODONE-acetaminophen  2 tablet Oral Once  . potassium chloride  40 mEq Oral BID  . sodium chloride  3 mL Intravenous Q12H  . sodium chloride  3 mL Intravenous Q12H  . DISCONTD: enoxaparin (LOVENOX) injection  30 mg Subcutaneous Q24H  . DISCONTD: exenatide  5 mcg Subcutaneous BID WC  . DISCONTD: insulin aspart  0-20 Units Subcutaneous TID WC  . DISCONTD: insulin aspart  0-5 Units Subcutaneous QHS  . DISCONTD: insulin aspart  3-20 Units Subcutaneous QHS   Continuous Infusions:    Code Status: Full Family Communication: Pt at bedside Disposition Plan: Home when stable  [name], MD  [facility] Pager [phone]  If 7PM-7AM, please contact night-coverage [email or web address] Password [email or web address] 11/26/2011, 12:58 PM   LOS: 1 day

## 2011-11-26 NOTE — Progress Notes (Addendum)
Patient complaining of heel pain.  On inspection looks discolored in comparison to surrounding tissue.  Noted discoloration of toe nails and big toe.  Applied heel dressing.

## 2011-11-27 DIAGNOSIS — R599 Enlarged lymph nodes, unspecified: Secondary | ICD-10-CM

## 2011-11-27 LAB — BASIC METABOLIC PANEL
CO2: 30 mEq/L (ref 19–32)
Chloride: 97 mEq/L (ref 96–112)
GFR calc non Af Amer: 30 mL/min — ABNORMAL LOW (ref >90)
Glucose, Bld: 104 mg/dL — ABNORMAL HIGH (ref 70–99)
Potassium: 3.2 mEq/L — ABNORMAL LOW (ref 3.5–5.1)
Sodium: 137 mEq/L (ref 135–145)

## 2011-11-27 LAB — CBC
Hemoglobin: 10.5 g/dL — ABNORMAL LOW (ref 12.0–15.0)
RBC: 3.66 MIL/uL — ABNORMAL LOW (ref 3.87–5.11)
WBC: 5.8 10*3/uL (ref 4.0–10.5)

## 2011-11-27 LAB — GLUCOSE, CAPILLARY
Glucose-Capillary: 162 mg/dL — ABNORMAL HIGH (ref 70–99)
Glucose-Capillary: 201 mg/dL — ABNORMAL HIGH (ref 70–99)
Glucose-Capillary: 160 mg/dL — ABNORMAL HIGH (ref 70–99)

## 2011-11-27 MED ORDER — INSULIN GLARGINE 100 UNIT/ML ~~LOC~~ SOLN
45.0000 [IU] | Freq: Every day | SUBCUTANEOUS | Status: DC
  Administered 2011-11-28: 45 [IU] via SUBCUTANEOUS

## 2011-11-27 MED ORDER — POTASSIUM CHLORIDE CRYS ER 20 MEQ PO TBCR
40.0000 meq | EXTENDED_RELEASE_TABLET | Freq: Two times a day (BID) | ORAL | Status: AC
  Administered 2011-11-27 (×2): 40 meq via ORAL
  Filled 2011-11-27 (×2): qty 2

## 2011-11-27 MED ORDER — CARVEDILOL 6.25 MG PO TABS
6.2500 mg | ORAL_TABLET | Freq: Two times a day (BID) | ORAL | Status: DC
  Administered 2011-11-27 – 2011-11-28 (×2): 6.25 mg via ORAL
  Filled 2011-11-27 (×4): qty 1

## 2011-11-27 NOTE — Progress Notes (Signed)
  Echocardiogram 2D Echocardiogram has been performed.  Debra Stokes 11/27/2011, 3:24 PM

## 2011-11-27 NOTE — Progress Notes (Signed)
Patient ID: Debra Stokes, female   DOB: [birth year], 36 y.o.   MRN: [id number]  [facility] PROGRESS NOTE  Debra Stokes WUJ:[id number] DOB: 1975/12/13 DOA: 11/25/2011 PCP: Lonell Face, MD  Brief narrative:  Pt is 36 yo female morbidly obese, who was admitted 11/25/2011 for evaluation of chest pain and shortness of breath. She is currently being worked up for acute CHF exacerbation which was determined to be secondary to medical non compliance. She has shown clinical improvement but while on Lasix Creatinine has been trending up.   Principal Problem:  *CHF (congestive heart failure)  - per last 2 D ECHO 2012 pt had combined systolic and diastolic CHF  - she was supposed to be on Lasix but due to medical noncompliance she has now been off medication for over 2 weeks  - she reports clinical improvement this AM but still short of breath mostly with exertion - 2 D ECHO done this AM and results pending  - d/c Lisinopril and other nephrotoxin, continue low dose Lasix - follow up on daily weights, I's and O's  - BMP in AM   Chest pain  - CE's x 3 negative and pain resolved this AM  - UDS pending  - continue supportive care   Active Problems:  Diabetes mellitus type 2 with complications, uncontrolled, gastroparesis and neuropathy   - A1C > 9 - continue insulin and readjust the dosing as indicated   Hypokalemia  - secondary to diuresis  - supplement  - BMP in AM   Hypertension, accelerated  - increase the dose of Labetalol, add Hydralazine  - add Amlodipine  Morbid obesity with BMI of 50.0-59.9, adult  - dietary recommendations provided along with physical activity   Chronic renal insufficiency, stage III (moderate)  - creatinine up from yesterday  - will lower dose of Lasix  - BMP in AM  Anemia of chronic disease - Hg and Hct are stable and at pt's baseline - CBC in AM  Polysubstance abuse  - UDS pending   COPD (chronic obstructive pulmonary disease)  - continue  supportive care   Tobacco use  - consultation on cessation provided   Consultants:  None  Procedures:  None  Antibiotics:  None  HPI/Subjective:  No events overnight    Objective: Filed Vitals:   11/27/11 1131 11/27/11 1256 11/27/11 1300 11/27/11 1523  BP: 171/107 161/94  143/91  Pulse: 78 70 77 69  Temp:   98 F (36.7 C)   TempSrc:      Resp:   17   Height:      Weight:      SpO2:   100%     Intake/Output Summary (Last 24 hours) at 11/27/11 1601 Last data filed at 11/27/11 1133  Gross per 24 hour  Intake      3 ml  Output   1550 ml  Net  -1547 ml    Exam:   General:  Pt is alert, follows commands appropriately, not in acute distress  Cardiovascular: Regular rate and rhythm, S1/S2, no murmurs, no rubs, no gallops  Respiratory: Diminished breath sounds throughout, limited exam due to body habitus   Abdomen: Soft, non tender, non distended, bowel sounds present, no guarding  Extremities: Trace bilateral lower extremity edema, pulses DP and PT palpable bilaterally  Neuro: Grossly nonfocal  Data Reviewed: Basic Metabolic Panel:  Lab 11/27/11 9562 11/26/11 0138 11/25/11 1744 11/25/11 1520  NA 137 138 -- 136  K 3.2* 3.2* -- 3.0*  CL 97  97 -- 98  CO2 30 30 -- 27  GLUCOSE 104* 182* -- 254*  BUN 26* 19 -- 18  CREATININE 2.06* 1.73* -- 1.45*  CALCIUM 9.1 9.0 -- 9.2  MG -- -- 2.0 --  PHOS -- -- 3.6 --   CBC:  Lab 11/27/11 0441 11/26/11 0138 11/25/11 1520  WBC 5.8 5.9 5.5  NEUTROABS -- -- 3.7  HGB 10.5* 10.7* 11.7*  HCT 33.6* 34.3* 37.3  MCV 91.8 90.7 90.1  PLT 253 233 261   Cardiac Enzymes:  Lab 11/26/11 0138 11/25/11 1740 11/25/11 1525  CKTOTAL 88 98 --  CKMB 2.3 2.4 --  CKMBINDEX -- -- --  TROPONINI <0.30 <0.30 <0.30   BNP: No components found with this basename: POCBNP:5 CBG:  Lab 11/27/11 1154 11/27/11 0823 11/27/11 0350 11/26/11 2357 11/26/11 1948  GLUCAP 201* 162* 108* 175* 158*    Recent Results (from the past 240 hour(s))    MRSA PCR SCREENING     Status: Abnormal   Collection Time   11/25/11  9:01 PM      Component Value Range Status Comment   MRSA by PCR POSITIVE (*) NEGATIVE Final      Studies: Dg Chest 2 View  11/25/2011  *RADIOLOGY REPORT*  Clinical Data: Chest pain and shortness of breath.  CHF.  Asthma. Smoker.  CHEST - 2 VIEW  Comparison: 11/11/2011  Findings: The lateral view is mildly motion degraded.  The frontal view is mildly degraded by patient body habitus. Moderate cardiomegaly.  No definite pleural effusion. No pneumothorax.  Mild interstitial edema. No lobar consolidation.  IMPRESSION: Cardiomegaly with mild congestive heart failure.  Decreased sensitivity and specificity exam due to technique related factors, as described above.  Original Report Authenticated By: Consuello Bossier, M.D.    Scheduled Meds:   . amLODipine  10 mg Oral Daily  . aspirin EC  81 mg Oral Daily  . Chlorhexidine Gluc  6 each Topical Q0600  . enoxaparin  injection  40 mg Subcutaneous Q24H  . exenatide  5 mcg Subcutaneous BID WC  . furosemide  40 mg Oral Daily  . hydrALAZINE  25 mg Oral Q8H  . hydrochlorothiazide  50 mg Oral Daily  . insulin aspart  0-9 Units Subcutaneous Q4H  . insulin glargine  40 Units Subcutaneous Daily  . labetalol  600 mg Oral TID  . lisinopril  10 mg Oral Daily  . potassium chloride  40 mEq Oral Daily  . potassium chloride  40 mEq Oral BID   Continuous Infusions:    Code Status: Full Family Communication: Pt at bedside Disposition Plan: Home when medically stable  Debra Presto, MD  [facility] Pager [phone]  If 7PM-7AM, please contact night-coverage [email or web address] Password [email or web address] 11/27/2011, 4:01 PM   LOS: 2 days

## 2011-11-27 NOTE — Evaluation (Signed)
Physical Therapy Evaluation Patient Details Name: Debra Stokes MRN: [id number] DOB: May 31, 1975 Today's Date: 11/27/2011 Time: 7829-5621 PT Time Calculation (min): 24 min  PT Assessment / Plan / Recommendation Clinical Impression  Pt presents with CHF with history of DM, chronic back pain and HTN.  Noted that pt has wound on R heel/Achilles area with dressing for protection.  Per pt, wound team to address wound.  Tolerated some ambulation in room to door, however pt with increased pain in RLE.  Pt will benefit from skilled PT in acute venue to address  deficits.  PT recommends HHPT for follow up therapy at D/C  to return pt ot PLOF.      PT Assessment  Patient needs continued PT services    Follow Up Recommendations  Home health PT    Barriers to Discharge None      Equipment Recommendations  Rolling walker with 5" wheels (bari walker)    Recommendations for Other Services     Frequency Min 3X/week    Precautions / Restrictions Precautions Precautions: Fall Precaution Comments: Pt with wound on R heel/achilles area.  Wound team to see pt.  Restrictions Weight Bearing Restrictions: No   Pertinent Vitals/Pain 8/10      Mobility  Bed Mobility Bed Mobility: Supine to Sit;Sitting - Scoot to Edge of Bed Supine to Sit: 4: Min assist Sitting - Scoot to [place] of Bed: 4: Min guard Details for Bed Mobility Assistance: Some assist for RLE to EOB due to pain.  Min cues for technique and safety.  Transfers Transfers: Sit to Stand;Stand to Sit Sit to Stand: 4: Min guard;With upper extremity assist;From bed Stand to Sit: 4: Min guard;With upper extremity assist;With armrests;To chair/3-in-1 Details for Transfer Assistance: Min/guard for safety with cues for hand placement when sitting/standing.  Ambulation/Gait Ambulation/Gait Assistance: 4: Min assist Ambulation Distance (Feet): 10 Feet Assistive device: Rolling walker Ambulation/Gait Assistance Details: Cues for  sequencing/technique with RW and to use UEs to take weight off RLE as much as possible.  Also cues for upright posture and deep breathing due to tendency to hold breath.  Gait Pattern: Step-to pattern;Trunk flexed;Decreased stance time - right;Decreased step length - left Gait velocity: decreased Stairs: No Wheelchair Mobility Wheelchair Mobility: No    Exercises     PT Diagnosis: Difficulty walking;Generalized weakness;Acute pain  PT Problem List: Decreased strength;Decreased activity tolerance;Decreased balance;Decreased mobility;Decreased knowledge of use of DME;Cardiopulmonary status limiting activity;Pain;Decreased knowledge of precautions PT Treatment Interventions: DME instruction;Gait training;Stair training;Functional mobility training;Therapeutic activities;Therapeutic exercise;Balance training;Patient/family education   PT Goals Acute Rehab PT Goals PT Goal Formulation: With patient Time For Goal Achievement: 12/11/11 Potential to Achieve Goals: Good Pt will go Sit to Stand: with modified independence PT Goal: Sit to Stand - Progress: Goal set today Pt will go Stand to Sit: with modified independence PT Goal: Stand to Sit - Progress: Goal set today Pt will Ambulate: 51 - 150 feet;with modified independence;with least restrictive assistive device PT Goal: Ambulate - Progress: Goal set today Pt will Go Up / Down Stairs: Flight;with min assist;with least restrictive assistive device;with rail(s) PT Goal: Up/Down Stairs - Progress: Goal set today Pt will Perform Home Exercise Program: with supervision, verbal cues required/provided PT Goal: Perform Home Exercise Program - Progress: Goal set today  Visit Information  Last PT Received On: 11/27/11 Assistance Needed: +1 PT/OT Co-Evaluation/Treatment: Yes    Subjective Data  Subjective: It feels like there are lumps all around the bottom of my foot.  Patient Stated Goal: get  pain under control    Prior Functioning  Home  Living Lives With: Other (Comment) (G/F, son, niece and nephew. ) Available Help at Discharge: Family Type of Home: House Home Access: Stairs to enter Secretary/administrator of Steps: 3 Entrance Stairs-Rails: Right;Left;Can reach both Home Layout: Two level;Bed/bath upstairs Alternate Level Stairs-Number of Steps: Flight Alternate Level Stairs-Rails: Right Bathroom Shower/Tub: Engineer, manufacturing systems: Standard Home Adaptive Equipment: None Additional Comments: bed and bath upstairs; she stays on second floor Prior Function Level of Independence: Independent Able to Take Stairs?: Yes (with family assisting) Driving: No Communication Communication: No difficulties    Cognition  Overall Cognitive Status: Appears within functional limits for tasks assessed/performed Arousal/Alertness: Awake/alert Orientation Level: Appears intact for tasks assessed Behavior During Session: [facility] for tasks performed    Extremity/Trunk Assessment Right Lower Extremity Assessment RLE ROM/Strength/Tone: Unable to fully assess;Due to pain (Educated pt on very little WB on RLE) RLE Coordination: WFL - gross motor Left Lower Extremity Assessment LLE ROM/Strength/Tone: WFL for tasks assessed LLE Coordination: WFL - gross motor Trunk Assessment Trunk Assessment: Kyphotic   Balance    End of Session PT - End of Session Activity Tolerance: Patient limited by pain Patient left: in chair;with call bell/phone within reach;with family/visitor present Nurse Communication: Mobility status  GP     Page, Meribeth Mattes 11/27/2011, 10:28 AM

## 2011-11-27 NOTE — Care Management Note (Signed)
    Page 1 of 1   11/28/2011     11:53:40 AM   CARE MANAGEMENT NOTE 11/28/2011  Patient:  [facility]   Account Number:  [id number]  Date Initiated:  11/27/2011  Documentation initiated by:  Lanier Clam  Subjective/Objective Assessment:   ADMITTED W/SOB.CHF.READMIT-VULVAR ABSCESS.     Action/Plan:   FROM HOME   Anticipated DC Date:  11/28/2011   Anticipated DC Plan:  HOME W HOME HEALTH SERVICES      DC Planning Services  CM consult      Choice offered to / List presented to:  C-1 Patient   DME arranged  [facility]      DME agency  [facility]     HH arranged  HH-1 RN  HH-2 PT      [facility] agency  [facility]   Status of service:  Completed, signed off Medicare Important Message given?   (If response is "NO", the following Medicare IM given date fields will be blank) Date Medicare IM given:   Date Additional Medicare IM given:    Discharge Disposition:  HOME W HOME HEALTH SERVICES  Per UR Regulation:  Reviewed for med. necessity/level of care/duration of stay  If discussed at Long Length of Stay Meetings, dates discussed:    Comments:  11/28/11 Eytan Carrigan RN,BSN NCM [phone] [facility] CHOSEN SUSAN D[facility]AS[facility])AWARE OF ?D/C & FOLLOWING FOR HHRN/PT,ALREADY USES [facility] HOME 02 WILL NEED TRAVEL TANK BROUGHT TO RM.PATIENT ALSO STATED MAY HAVE LOST HOME 02 [facility] MADE AWARE.MD PLEASE PUT IN [facility] ORDERS,RESUME HOME 02,& FACE TO FACE.  11/25/11 Lorretta Kerce RN,BSN NCM [phone] PROVIDED PATIENT W/[facility] AGENCY LIST.PT-HH.OT-NA

## 2011-11-27 NOTE — Evaluation (Signed)
Occupational Therapy Evaluation Patient Details Name: Debra Stokes MRN: [id number] DOB: 04-30-1976 Today's Date: 11/27/2011 Time: 4098-1191 OT Time Calculation (min): 24 min  OT Assessment / Plan / Recommendation Clinical Impression  This 36 year old female was admitted with CHF.  Additionally, she has a h/o back pain and currently L foot is very painful--wound care nurse to see her.  Pt would benefit from skilled OT in acute to increase activity tolerance.  Will not need follow up OT after acute.    OT Assessment  Patient needs continued OT Services    Follow Up Recommendations  No OT follow up    Barriers to Discharge Other (comment) (stairs to enter; full flight to bed/bath)    Equipment Recommendations  Rolling walker with 5" wheels (bari walker; ot to further assess)    Recommendations for Other Services    Frequency  Min 2X/week    Precautions / Restrictions Precautions Precautions: Fall Precaution Comments: Pt with wound on R heel/achilles area.  Wound team to see pt.  Restrictions Weight Bearing Restrictions: No   Pertinent Vitals/Pain 8/10 L foot:  premedicated    ADL  Eating/Feeding: Simulated;Independent Where Assessed - Eating/Feeding: Chair Grooming: Simulated;Set up Where Assessed - Grooming: Supported sitting Upper Body Bathing: Simulated;Set up Where Assessed - Upper Body Bathing: Supported sitting Lower Body Bathing: Simulated;Minimal assistance Where Assessed - Lower Body Bathing: Supported sit to stand Upper Body Dressing: Simulated;Set up Where Assessed - Upper Body Dressing: Supported sitting Lower Body Dressing: Simulated;Set up (pants only (wears slip on shoes)) Where Assessed - Lower Body Dressing: Supported sit to stand Toilet Transfer: Simulated;Min guard (bed to recliner after ambulating a little) Toileting - Clothing Manipulation and Hygiene: Simulated;Min guard Where Assessed - Toileting Clothing Manipulation and Hygiene: Sit to stand from  3-in-1 or toilet Tub/Shower Transfer:  (discussed sponge bathing initially; cannot step over tub) Equipment Used: Rolling walker Transfers/Ambulation Related to ADLs: cotx with PT.  Pt with decreased tolerance for ambulating due to L foot pain. ADL Comments: Will have help for adls if needed at home.     OT Diagnosis: Generalized weakness  OT Problem List: Pain;Decreased activity tolerance;Decreased strength OT Treatment Interventions: Self-care/ADL training;Patient/family education;DME and/or AE instruction   OT Goals Acute Rehab OT Goals OT Goal Formulation: With patient Time For Goal Achievement: 12/11/11 Potential to Achieve Goals: Good ADL Goals Pt Will Transfer to Toilet: with supervision;Ambulation;Regular height toilet (vs 3:1 on top) ADL Goal: Toilet Transfer - Progress: Goal set today Miscellaneous OT Goals Miscellaneous OT Goal #1: Pt will ambulate to sink with supervision and perform 2 tasks at this level for increased standing tolerance OT Goal: Miscellaneous Goal #1 - Progress: Goal set today  Visit Information  Last OT Received On: 11/27/11 Assistance Needed: +1 PT/OT Co-Evaluation/Treatment: Yes    Subjective Data  Subjective: I just stay up on the second floor.  I could get up from the commode with extra time Patient Stated Goal: get rid of pain in L foot to be able to function more easily   Prior Functioning  Vision/Perception  Home Living Lives With: Other (Comment) (girlfriend, son, niece and nephew. ) Available Help at Discharge: Family Type of Home: House Home Access: Stairs to enter Secretary/administrator of Steps: 3 Entrance Stairs-Rails: Right;Left;Can reach both Home Layout: Two level;Bed/bath upstairs Alternate Level Stairs-Number of Steps: Flight Alternate Level Stairs-Rails: Right Bathroom Shower/Tub: Engineer, manufacturing systems: Standard Home Adaptive Equipment: None Additional Comments: bed and bath upstairs; she stays on second  floor  Prior Function Level of Independence: Independent Able to Take Stairs?: Yes (with family assisting) Driving: No Communication Communication: No difficulties      Cognition  Overall Cognitive Status: Appears within functional limits for tasks assessed/performed Arousal/Alertness: Awake/alert Orientation Level: Appears intact for tasks assessed Behavior During Session: [facility] for tasks performed    Extremity/Trunk Assessment Right Upper Extremity Assessment RUE ROM/Strength/Tone: Within functional levels Left Upper Extremity Assessment LUE ROM/Strength/Tone: Within functional levels  Mobility Bed Mobility Bed Mobility: Supine to Sit;Sitting - Scoot to Edge of Bed Supine to Sit: 4: Min assist Sitting - Scoot to [place] of Bed: 4: Min guard Details for Bed Mobility Assistance: Some assist for RLE to EOB due to pain.  Min cues for technique and safety.  Transfers Sit to Stand: 4: Min guard;With upper extremity assist;From bed Stand to Sit: 4: Min guard;With upper extremity assist;With armrests;To chair/3-in-1 Details for Transfer Assistance: Min/guard for safety with cues for hand placement when sitting/standing.    Exercise    Balance    End of Session OT - End of Session Activity Tolerance: Patient limited by pain Patient left: in chair;with call bell/phone within reach  GO     [facility] 11/27/2011, 10:46 AM Marica Otter, OTR/L [phone] 11/27/2011

## 2011-11-28 LAB — GLUCOSE, CAPILLARY
Glucose-Capillary: 150 mg/dL — ABNORMAL HIGH (ref 70–99)
Glucose-Capillary: 158 mg/dL — ABNORMAL HIGH (ref 70–99)

## 2011-11-28 LAB — CBC
MCHC: 31.6 g/dL (ref 30.0–36.0)
Platelets: 231 10*3/uL (ref 150–400)
RDW: 16.6 % — ABNORMAL HIGH (ref 11.5–15.5)
WBC: 5.3 10*3/uL (ref 4.0–10.5)

## 2011-11-28 LAB — BASIC METABOLIC PANEL
Chloride: 96 mEq/L (ref 96–112)
Creatinine, Ser: 2.06 mg/dL — ABNORMAL HIGH (ref 0.50–1.10)
GFR calc Af Amer: 35 mL/min — ABNORMAL LOW (ref >90)
GFR calc non Af Amer: 30 mL/min — ABNORMAL LOW (ref >90)
Potassium: 4.3 mEq/L (ref 3.5–5.1)

## 2011-11-28 LAB — RAPID URINE DRUG SCREEN, HOSP PERFORMED
Barbiturates: NOT DETECTED
Cocaine: POSITIVE — AB
Tetrahydrocannabinol: NOT DETECTED

## 2011-11-28 MED ORDER — HYDRALAZINE HCL 25 MG PO TABS: 25.0000 mg | ORAL_TABLET | Freq: Three times a day (TID) | ORAL | Status: DC

## 2011-11-28 MED ORDER — CARVEDILOL 6.25 MG PO TABS: 6.2500 mg | ORAL_TABLET | Freq: Two times a day (BID) | ORAL | Status: DC

## 2011-11-28 MED ORDER — LORAZEPAM 2 MG PO TABS: 2.0000 mg | ORAL_TABLET | Freq: Four times a day (QID) | ORAL | Status: DC | PRN

## 2011-11-28 MED ORDER — ALBUTEROL SULFATE HFA 108 (90 BASE) MCG/ACT IN AERS: 2.0000 | INHALATION_SPRAY | Freq: Four times a day (QID) | RESPIRATORY_TRACT | Status: DC | PRN

## 2011-11-28 MED ORDER — AMLODIPINE BESYLATE 10 MG PO TABS: 10.0000 mg | ORAL_TABLET | Freq: Every day | ORAL | Status: DC

## 2011-11-28 MED ORDER — OXYCODONE-ACETAMINOPHEN 7.5-325 MG PO TABS: 1.0000 | ORAL_TABLET | ORAL | Status: AC | PRN

## 2011-11-28 MED ORDER — POTASSIUM CHLORIDE CRYS ER 20 MEQ PO TBCR: 40.0000 meq | EXTENDED_RELEASE_TABLET | Freq: Every day | ORAL | Status: DC

## 2011-11-28 MED ORDER — LABETALOL HCL 300 MG PO TABS: 600.0000 mg | ORAL_TABLET | Freq: Three times a day (TID) | ORAL | Status: DC

## 2011-11-28 MED ORDER — ASPIRIN 81 MG PO TBEC: 81.0000 mg | DELAYED_RELEASE_TABLET | Freq: Every day | ORAL | Status: DC

## 2011-11-28 MED ORDER — FUROSEMIDE 20 MG PO TABS: 20.0000 mg | ORAL_TABLET | Freq: Every day | ORAL | Status: DC

## 2011-11-28 NOTE — Discharge Summary (Signed)
Physician Discharge Summary  Debra Stokes FAO:[id number] DOB: August 26, 1975 DOA: 11/25/2011  PCP: Lonell Face, MD  Admit date: 11/25/2011 Discharge date: 11/28/2011  Recommendations for Outpatient Follow-up:  1. Pt will need to see PCP in 2-3 weeks post discharge 2. Please note that pt has had positive urine drug screen for cocaine 3. She was discharged on Lasix and therefore please check BMP to ensure potassium and creatinine remain stable and at pt's baseline 4. Please also check CBC to evaluate if Hg remains stable and at pt's baseline  Discharge Diagnoses: Acute on chronic respiratory failure secondary to diastolic CHF exacerbation    Principal Problem:  *CHF (congestive heart failure) Active Problems:  Diabetes mellitus type 2 with complications, uncontrolled  Morbid obesity with BMI of 50.0-59.9, adult  Chronic renal insufficiency, stage III (moderate)  Polysubstance abuse  COPD (chronic obstructive pulmonary disease)  Tobacco use  Chest pain  Hypokalemia  Discharge Condition: Stable  Diet recommendation: Low sodium diet discussed  History of present illness:  Pt is 36 yo female who presents with substernal, intermittent and pressure like chest pain that started the morning of admission, 7/10 in severity when present, non radiating but associated with shortness of breath, with no specific aggravating or alleviating factors. She denies similar episodes in the past but reports running out of Lasix and her PCP is out of town and she was not able to obtain Lasix.  Hospital Course:   Principal Problem:  *CHF (congestive heart failure)  - per last 2 D ECHO 2012 pt had combined systolic and diastolic CHF  - she was supposed to be on Lasix but due to medical noncompliance she has now been off medication for over 2 weeks  - she reports clinical improvement this AM but still short of breath mostly with exertion  - 2 D ECHO done this AM and results pending  - d/c Lisinopril and  other nephrotoxins, continue low dose Lasix   Chest pain  - CE's x 3 negative and pain resolved this AM  - UDS positive for cocaine 4 days after she has been in the hospital, highly suspicious for drug use while in hospital  - continues supportive care   Active Problems:  Diabetes mellitus type 2 with complications, uncontrolled, gastroparesis and neuropathy  - A1C > 9  - continued insulin and readjusted the dosing as indicated   Hypokalemia  - secondary to diuresis  - supplemented   Hypertension, accelerated  - increase the dose of Labetalol, add Hydralazine  - add Amlodipine   Morbid obesity with BMI of 50.0-59.9, adult  - dietary recommendations provided along with physical activity   Chronic renal insufficiency, stage III (moderate)  - creatinine up from yesterday  - will lower dose of Lasix   Anemia of chronic disease  - Hg and Hct are stable and at pt's baseline   Polysubstance abuse  - UDS cocaine positive   COPD (chronic obstructive pulmonary disease)  - continued supportive care   Tobacco use  - consultation on cessation provided   Consultants:  None  Procedures:  None  Antibiotics:  None  Discharge Exam: Filed Vitals:   11/28/11 0433  BP: 111/76  Pulse: 66  Temp: 98 F (36.7 C)  Resp: 20   Filed Vitals:   11/27/11 2051 11/28/11 0014 11/28/11 0433 11/28/11 0500  BP: 120/84 106/72 111/76   Pulse: 71 68 66   Temp: 98.1 F (36.7 C) 98.1 F (36.7 C) 98 F (36.7 C)   TempSrc:  Oral Oral Oral   Resp: 18 18 20    Height:      Weight:    163.204 kg (359 lb 12.8 oz)  SpO2: 100% 100% 97%     General: Pt is alert, follows commands appropriately, not in acute distress,morbidly obese  Cardiovascular: Regular rate and rhythm, S1/S2 +, no murmurs, no rubs, no gallops Respiratory: Clear to auscultation bilaterally, no wheezing, no crackles, no rhonchi Abdominal: Soft, non tender, non distended, bowel sounds +, no guarding Extremities: no edema, no  cyanosis, pulses palpable bilaterally DP and PT Neuro: Grossly nonfocal  Discharge Instructions  Discharge Orders    Future Orders Please Complete By Expires   Diet - low sodium heart healthy      Increase activity slowly        Medication List  As of 11/28/2011 11:21 AM   STOP taking these medications         hydrochlorothiazide 50 MG tablet      lisinopril 10 MG tablet         TAKE these medications         albuterol 108 (90 BASE) MCG/ACT inhaler   Commonly known as: PROVENTIL HFA;VENTOLIN HFA   Inhale 2 puffs into the lungs every 6 (six) hours as needed. Asthma attacks      amLODipine 10 MG tablet   Commonly known as: NORVASC   Take 1 tablet (10 mg total) by mouth daily.      aspirin 81 MG EC tablet   Take 1 tablet (81 mg total) by mouth daily.      carvedilol 6.25 MG tablet   Commonly known as: COREG   Take 1 tablet (6.25 mg total) by mouth 2 (two) times daily with a meal.      exenatide 5 MCG/0.02ML Soln   Commonly known as: BYETTA   Inject 0.02 mLs (5 mcg total) into the skin 2 (two) times daily with a meal.      furosemide 20 MG tablet   Commonly known as: LASIX   Take 1 tablet (20 mg total) by mouth daily.      hydrALAZINE 25 MG tablet   Commonly known as: APRESOLINE   Take 1 tablet (25 mg total) by mouth every 8 (eight) hours.      insulin aspart 100 UNIT/ML injection   Commonly known as: novoLOG   Inject 0-20 Units into the skin 3 (three) times daily with meals.      insulin aspart 100 UNIT/ML injection   Commonly known as: novoLOG   Inject 3-20 Units into the skin at bedtime.      insulin glargine 100 UNIT/ML injection   Commonly known as: LANTUS   Inject 40 Units into the skin daily.      labetalol 300 MG tablet   Commonly known as: NORMODYNE   Take 2 tablets (600 mg total) by mouth 3 (three) times daily.      LORazepam 2 MG tablet   Commonly known as: ATIVAN   Take 1 tablet (2 mg total) by mouth every 6 (six) hours as needed for anxiety.  For anxiety.      oxyCODONE-acetaminophen 7.5-325 MG per tablet   Commonly known as: PERCOCET   Take 1 tablet by mouth every 4 (four) hours as needed for pain.      potassium chloride SA 20 MEQ tablet   Commonly known as: K-DUR,KLOR-CON   Take 2 tablets (40 mEq total) by mouth daily.  Follow-up Information    Follow up with CORBIER,PAUL A, MD in 2 weeks.   Contact information:   [address] [phone]           The results of significant diagnostics from this hospitalization (including imaging, microbiology, ancillary and laboratory) are listed below for reference.    Significant Diagnostic Studies: Dg Chest 2 View  11/25/2011  *RADIOLOGY REPORT*  Clinical Data: Chest pain and shortness of breath.  CHF.  Asthma. Smoker.  CHEST - 2 VIEW  Comparison: 11/11/2011  Findings: The lateral view is mildly motion degraded.  The frontal view is mildly degraded by patient body habitus. Moderate cardiomegaly.  No definite pleural effusion. No pneumothorax.  Mild interstitial edema. No lobar consolidation.  IMPRESSION: Cardiomegaly with mild congestive heart failure.  Decreased sensitivity and specificity exam due to technique related factors, as described above.  Original Report Authenticated By: Consuello Bossier, M.D.   Dg Chest Port 1 View  11/11/2011  *RADIOLOGY REPORT*  Clinical Data: Shortness of breath  PORTABLE CHEST - 1 VIEW  Comparison: 10/08/2011 CT  Findings: Degraded by patient body habitus and portable technique. Cardiomegaly.  Central vascular congestion.  Interstitial prominence may be accentuated by overlying soft tissues.  Cannot exclude a retrocardiac process.  No pneumothorax.  No definite pleural effusion.  No acute osseous finding.  IMPRESSION: Cardiomegaly with central vascular congestion.  Mild interstitial edema not excluded.  Original Report Authenticated By: Waneta Martins, M.D.    Microbiology: Recent Results (from the past 240 hour(s))  MRSA  PCR SCREENING     Status: Abnormal   Collection Time   11/25/11  9:01 PM      Component Value Range Status Comment   MRSA by PCR POSITIVE (*) NEGATIVE Final      Labs: Basic Metabolic Panel:  Lab 11/28/11 0981 11/27/11 0441 11/26/11 0138 11/25/11 1744 11/25/11 1520  NA 136 137 138 -- 136  K 4.3 3.2* 3.2* -- 3.0*  CL 96 97 97 -- 98  CO2 28 30 30  -- 27  GLUCOSE 159* 104* 182* -- 254*  BUN 25* 26* 19 -- 18  CREATININE 2.06* 2.06* 1.73* -- 1.45*  CALCIUM 9.0 9.1 9.0 -- 9.2  MG -- -- -- 2.0 --  PHOS -- -- -- 3.6 --   CBC:  Lab 11/28/11 0425 11/27/11 0441 11/26/11 0138 11/25/11 1520  WBC 5.3 5.8 5.9 5.5  NEUTROABS -- -- -- 3.7  HGB 10.8* 10.5* 10.7* 11.7*  HCT 34.2* 33.6* 34.3* 37.3  MCV 91.0 91.8 90.7 90.1  PLT 231 253 233 261   Cardiac Enzymes:  Lab 11/26/11 0138 11/25/11 1740 11/25/11 1525  CKTOTAL 88 98 --  CKMB 2.3 2.4 --  CKMBINDEX -- -- --  TROPONINI <0.30 <0.30 <0.30   BNP: BNP (last 3 results)  Basename 11/25/11 1525 11/11/11 2106 12/05/10 0640  PROBNP 3786.0* 4164.0* 140.1*   CBG:  Lab 11/28/11 0414 11/28/11 0009 11/27/11 2047 11/27/11 1644 11/27/11 1154  GLUCAP 158* 150* 140* 160* 201*    Time coordinating discharge: Over 30 minutes  Signed:  [name], MD  [facility] 11/28/2011, 11:21 AM Pager [phone]  If 7PM-7AM, please contact night-coverage [email or web address] Password [email or web address]

## 2011-11-28 NOTE — Progress Notes (Signed)
Patient discharge to home  family at bedside, discharge instructions and follow up appointment done and given to patient verbalized understanding. Bayeta Indulin Injection returned to patient.PIV removed no s/s of infiltration  Or swelling noted.

## 2011-11-28 NOTE — Plan of Care (Signed)
Problem: Discharge Progression Outcomes Goal: If EF < 40% ACEI/ARB addressed at discharge Outcome: Not Applicable Date Met:  11/28/11 EF 55-60%

## 2011-11-28 NOTE — Progress Notes (Signed)
Physical Therapy Treatment Patient Details Name: Kynslie Ringle MRN: [id number] DOB: 01/02/1976 Today's Date: 11/28/2011 Time: 1001-1011 PT Time Calculation (min): 10 min  PT Assessment / Plan / Recommendation Comments on Treatment Session  Pt pleasant and motivated to progress with mobility. 10/10 pain in R foot with walking, with pre-medication. Pt ambulated 69' with RW today. Bariatric RW recommended for home.     Follow Up Recommendations  Home health PT    Barriers to Discharge        Equipment Recommendations  Rolling walker with 5" wheels (bari walker; ot to further assess)    Recommendations for Other Services    Frequency Min 3X/week   Plan Discharge plan remains appropriate    Precautions / Restrictions Precautions Precautions: Fall Precaution Comments: Pt with wound R heel. Pt on contact precautions   Pertinent Vitals/Pain *10/10 R foot with walking; 7/10 at rest Pt premedicated, BLEs elevated after walking**    Mobility  Bed Mobility Bed Mobility: Supine to Sit Supine to Sit: HOB elevated;6: Modified independent (Device/Increase time);With rails Sitting - Scoot to Edge of Bed: 6: Modified independent (Device/Increase time);With rail Details for Bed Mobility Assistance: HOB 40* Transfers Sit to Stand: With upper extremity assist;5: Supervision;From bed Stand to Sit: To chair/3-in-1;With upper extremity assist;5: Supervision;With armrests Details for Transfer Assistance: min verbal cues for hand placmennt.  Ambulation/Gait Ambulation/Gait Assistance: 5: Supervision Ambulation Distance (Feet): 18 Feet Assistive device: Rolling walker Ambulation/Gait Assistance Details: no LOB, R foot pain limits activity tolerance Gait Pattern: Step-to pattern Gait velocity: decreased General Gait Details: Pt initially stated she was afraid to use a RW, thought it might roll out from under her. Pt agreed to trial of RW and noted some pain relief in R foot.     Exercises      PT Diagnosis:    PT Problem List:   PT Treatment Interventions:     PT Goals Acute Rehab PT Goals PT Goal Formulation: With patient Time For Goal Achievement: 12/11/11 Potential to Achieve Goals: Good Pt will go Sit to Stand: with modified independence PT Goal: Sit to Stand - Progress: Progressing toward goal Pt will go Stand to Sit: with modified independence PT Goal: Stand to Sit - Progress: Progressing toward goal Pt will Ambulate: 51 - 150 feet;with modified independence;with least restrictive assistive device PT Goal: Ambulate - Progress: Progressing toward goal Pt will Go Up / Down Stairs: Flight;with min assist;with least restrictive assistive device;with rail(s) Pt will Perform Home Exercise Program: with supervision, verbal cues required/provided  Visit Information  Last PT Received On: 11/28/11 Assistance Needed: +1    Subjective Data  Subjective: It feels like there's a big lump on the bottom of my R foot. I'm so used to being independent.  Patient Stated Goal: get back to being independent   Cognition  Overall Cognitive Status: Appears within functional limits for tasks assessed/performed Arousal/Alertness: Awake/alert Orientation Level: Appears intact for tasks assessed Behavior During Session: [facility] for tasks performed    Balance     End of Session PT - End of Session Activity Tolerance: Patient limited by pain Patient left: in chair;with call bell/phone within reach Nurse Communication: Mobility status   GP     Ralene Bathe Kistler 11/28/2011, 10:19 AM [phone]

## 2011-11-28 NOTE — Progress Notes (Signed)
Occupational Therapy Treatment Patient Details Name: Debra Stokes MRN: [id number] DOB: 1976-04-25 Today's Date: 11/28/2011 Time: 1308-6578 OT Time Calculation (min): 18 min  OT Assessment / Plan / Recommendation Comments on Treatment Session Pt limited by pain. Was able to assess for DME and pt does not need a 3in1 at this time.    Follow Up Recommendations  No OT follow up    Barriers to Discharge       Equipment Recommendations  None recommended by OT;Rolling walker with 5" wheels;Other (comment) (bari walker)    Recommendations for Other Services    Frequency Min 2X/week   Plan Discharge plan remains appropriate    Precautions / Restrictions Precautions Precautions: Fall Precaution Comments: Pt with wound R heel. Pt on contact precautions        ADL  Toilet Transfer: Simulated;Min guard Toilet Transfer Method: Other (comment) (ambulating with RW, min verbal cues) Toilet Transfer Equipment: Regular height toilet;Other (comment) (simulated regular toilet with bed rail as window ledge) ADL Comments: Pt still with alot of pain in R foot but agreeable to work with OT. Simulated regular toilet height with window sill like at pt's home and she was able to sit and stand with min guard assist. Do not feel pt needs a 3in1. Pt agrees. Pt agreeable to continue to sponge bathe at discharge.    OT Diagnosis:    OT Problem List:   OT Treatment Interventions:     OT Goals ADL Goals ADL Goal: Toilet Transfer - Progress: Progressing toward goals  Visit Information  Last OT Received On: 11/28/11 Assistance Needed: +1    Subjective Data  Subjective: I can try Patient Stated Goal: pt agreeable to try to get up with OT; wants pain to decrease   Prior Functioning       Cognition  Overall Cognitive Status: Appears within functional limits for tasks assessed/performed Arousal/Alertness: Awake/alert Orientation Level: Appears intact for tasks assessed Behavior During Session: [facility]  for tasks performed    Mobility Bed Mobility Bed Mobility: Supine to Sit Supine to Sit: 4: Min guard;HOB elevated Sitting - Scoot to Edge of Bed: 4: Min guard Details for Bed Mobility Assistance: painful with movement but pt able to manuever self. Transfers Transfers: Sit to Stand;Stand to Sit Sit to Stand: 4: Min guard;From chair/3-in-1;With upper extremity assist Stand to Sit: 4: Min guard;To chair/3-in-1;With upper extremity assist Details for Transfer Assistance: min verbal cues for hand placmennt.    Exercises    Balance    End of Session OT - End of Session Activity Tolerance: Patient limited by pain Patient left: in bed;with call bell/phone within reach  GO     Lennox Laity [phone] 11/28/2011, 10:04 AM

## 2011-12-19 ENCOUNTER — Encounter (HOSPITAL_COMMUNITY): Payer: Self-pay | Admitting: *Deleted

## 2011-12-19 ENCOUNTER — Inpatient Hospital Stay (HOSPITAL_COMMUNITY)
Admission: EM | Admit: 2011-12-19 | Discharge: 2011-12-22 | DRG: 292 | Disposition: A | Discharge status: Home / Self Care | Payer: Medicare Other | Attending: Family Medicine | Admitting: Family Medicine

## 2011-12-19 ENCOUNTER — Emergency Department (HOSPITAL_COMMUNITY): Payer: Medicare Other

## 2011-12-19 DIAGNOSIS — I5033 Acute on chronic diastolic (congestive) heart failure: Principal | ICD-10-CM | POA: Diagnosis present

## 2011-12-19 DIAGNOSIS — Z6841 Body Mass Index (BMI) 40.0 and over, adult: Secondary | ICD-10-CM

## 2011-12-19 DIAGNOSIS — E1149 Type 2 diabetes mellitus with other diabetic neurological complication: Secondary | ICD-10-CM | POA: Diagnosis present

## 2011-12-19 DIAGNOSIS — E876 Hypokalemia: Secondary | ICD-10-CM | POA: Diagnosis present

## 2011-12-19 DIAGNOSIS — I5031 Acute diastolic (congestive) heart failure: Secondary | ICD-10-CM

## 2011-12-19 DIAGNOSIS — G5793 Unspecified mononeuropathy of bilateral lower limbs: Secondary | ICD-10-CM | POA: Diagnosis present

## 2011-12-19 DIAGNOSIS — J449 Chronic obstructive pulmonary disease, unspecified: Secondary | ICD-10-CM

## 2011-12-19 DIAGNOSIS — E118 Type 2 diabetes mellitus with unspecified complications: Secondary | ICD-10-CM | POA: Diagnosis present

## 2011-12-19 DIAGNOSIS — E1165 Type 2 diabetes mellitus with hyperglycemia: Secondary | ICD-10-CM

## 2011-12-19 DIAGNOSIS — G4733 Obstructive sleep apnea (adult) (pediatric): Secondary | ICD-10-CM | POA: Diagnosis present

## 2011-12-19 DIAGNOSIS — Z9119 Patient's noncompliance with other medical treatment and regimen: Secondary | ICD-10-CM

## 2011-12-19 DIAGNOSIS — I10 Essential (primary) hypertension: Secondary | ICD-10-CM

## 2011-12-19 DIAGNOSIS — I129 Hypertensive chronic kidney disease with stage 1 through stage 4 chronic kidney disease, or unspecified chronic kidney disease: Secondary | ICD-10-CM | POA: Diagnosis present

## 2011-12-19 DIAGNOSIS — I161 Hypertensive emergency: Secondary | ICD-10-CM | POA: Diagnosis present

## 2011-12-19 DIAGNOSIS — J4489 Other specified chronic obstructive pulmonary disease: Secondary | ICD-10-CM

## 2011-12-19 DIAGNOSIS — F17200 Nicotine dependence, unspecified, uncomplicated: Secondary | ICD-10-CM | POA: Diagnosis present

## 2011-12-19 DIAGNOSIS — E1142 Type 2 diabetes mellitus with diabetic polyneuropathy: Secondary | ICD-10-CM | POA: Diagnosis present

## 2011-12-19 DIAGNOSIS — N183 Chronic kidney disease, stage 3 unspecified: Secondary | ICD-10-CM

## 2011-12-19 DIAGNOSIS — I509 Heart failure, unspecified: Secondary | ICD-10-CM

## 2011-12-19 DIAGNOSIS — Z91199 Patient's noncompliance with other medical treatment and regimen due to unspecified reason: Secondary | ICD-10-CM

## 2011-12-19 LAB — COMPREHENSIVE METABOLIC PANEL
ALT: 13 U/L (ref 0–35)
AST: 15 U/L (ref 0–37)
Albumin: 3.9 g/dL (ref 3.5–5.2)
Alkaline Phosphatase: 55 U/L (ref 39–117)
BUN: 18 mg/dL (ref 6–23)
Chloride: 98 mEq/L (ref 96–112)
Potassium: 4.4 mEq/L (ref 3.5–5.1)
Sodium: 136 mEq/L (ref 135–145)
Total Bilirubin: 0.5 mg/dL (ref 0.3–1.2)

## 2011-12-19 LAB — CBC WITH DIFFERENTIAL/PLATELET
Eosinophils Absolute: 0.1 10*3/uL (ref 0.0–0.7)
Eosinophils Relative: 1 % (ref 0–5)
Hemoglobin: 12.8 g/dL (ref 12.0–15.0)
Lymphs Abs: 1.5 10*3/uL (ref 0.7–4.0)
MCH: 29 pg (ref 26.0–34.0)
MCV: 89.6 fL (ref 78.0–100.0)
Monocytes Relative: 7 % (ref 3–12)
Neutrophils Relative %: 70 % (ref 43–77)
RBC: 4.42 MIL/uL (ref 3.87–5.11)

## 2011-12-19 LAB — GLUCOSE, CAPILLARY
Glucose-Capillary: 119 mg/dL — ABNORMAL HIGH (ref 70–99)
Glucose-Capillary: 169 mg/dL — ABNORMAL HIGH (ref 70–99)

## 2011-12-19 LAB — D-DIMER, QUANTITATIVE: D-Dimer, Quant: 0.3 ug/mL-FEU (ref 0.00–0.48)

## 2011-12-19 LAB — RAPID URINE DRUG SCREEN, HOSP PERFORMED: Opiates: NOT DETECTED

## 2011-12-19 LAB — CARDIAC PANEL(CRET KIN+CKTOT+MB+TROPI)
CK, MB: 2.6 ng/mL (ref 0.3–4.0)
Troponin I: <0.3 ng/mL (ref <0.30)

## 2011-12-19 LAB — URINALYSIS, ROUTINE W REFLEX MICROSCOPIC
Bilirubin Urine: NEGATIVE
Leukocytes, UA: NEGATIVE
Nitrite: NEGATIVE
Specific Gravity, Urine: 1.017 (ref 1.005–1.030)
pH: 5.5 (ref 5.0–8.0)

## 2011-12-19 LAB — POCT I-STAT TROPONIN I

## 2011-12-19 MED ORDER — HYDRALAZINE HCL 25 MG PO TABS
25.0000 mg | ORAL_TABLET | Freq: Three times a day (TID) | ORAL | Status: DC
  Filled 2011-12-19 (×2): qty 1

## 2011-12-19 MED ORDER — HYDRALAZINE HCL 50 MG PO TABS
50.0000 mg | ORAL_TABLET | Freq: Three times a day (TID) | ORAL | Status: DC
  Filled 2011-12-19 (×2): qty 1

## 2011-12-19 MED ORDER — ENOXAPARIN SODIUM 80 MG/0.8ML ~~LOC~~ SOLN
80.0000 mg | SUBCUTANEOUS | Status: DC
  Administered 2011-12-19 – 2011-12-21 (×3): 80 mg via SUBCUTANEOUS
  Filled 2011-12-19 (×4): qty 0.8

## 2011-12-19 MED ORDER — ASPIRIN EC 81 MG PO TBEC
81.0000 mg | DELAYED_RELEASE_TABLET | Freq: Every day | ORAL | Status: DC
  Administered 2011-12-19 – 2011-12-22 (×4): 81 mg via ORAL
  Filled 2011-12-19 (×4): qty 1

## 2011-12-19 MED ORDER — NITROGLYCERIN IN D5W 200-5 MCG/ML-% IV SOLN
2.0000 ug/min | INTRAVENOUS | Status: DC
  Administered 2011-12-19: 20 ug/min via INTRAVENOUS
  Administered 2011-12-19: 10 ug/min via INTRAVENOUS

## 2011-12-19 MED ORDER — HYDROMORPHONE HCL PF 1 MG/ML IJ SOLN
1.0000 mg | INTRAMUSCULAR | Status: DC | PRN
  Administered 2011-12-19 – 2011-12-21 (×12): 1 mg via INTRAVENOUS
  Filled 2011-12-19 (×12): qty 1

## 2011-12-19 MED ORDER — SODIUM CHLORIDE 0.9 % IV SOLN: 250.0000 mL | INTRAVENOUS | Status: DC | PRN

## 2011-12-19 MED ORDER — ONDANSETRON HCL 4 MG PO TABS: 4.0000 mg | ORAL_TABLET | Freq: Four times a day (QID) | ORAL | Status: DC | PRN

## 2011-12-19 MED ORDER — INSULIN GLARGINE 100 UNIT/ML ~~LOC~~ SOLN
40.0000 [IU] | Freq: Every day | SUBCUTANEOUS | Status: DC
  Administered 2011-12-19: 40 [IU] via SUBCUTANEOUS

## 2011-12-19 MED ORDER — NITROGLYCERIN IN D5W 200-5 MCG/ML-% IV SOLN
5.0000 ug/min | INTRAVENOUS | Status: DC
  Administered 2011-12-19: 5 ug/min via INTRAVENOUS
  Filled 2011-12-19: qty 250

## 2011-12-19 MED ORDER — POTASSIUM CHLORIDE CRYS ER 20 MEQ PO TBCR
40.0000 meq | EXTENDED_RELEASE_TABLET | Freq: Every day | ORAL | Status: DC
  Administered 2011-12-19 – 2011-12-22 (×4): 40 meq via ORAL
  Filled 2011-12-19 (×4): qty 2

## 2011-12-19 MED ORDER — FUROSEMIDE 10 MG/ML IJ SOLN
60.0000 mg | Freq: Once | INTRAMUSCULAR | Status: AC
  Administered 2011-12-19: 60 mg via INTRAVENOUS
  Filled 2011-12-19: qty 8

## 2011-12-19 MED ORDER — LORAZEPAM 1 MG PO TABS
2.0000 mg | ORAL_TABLET | Freq: Four times a day (QID) | ORAL | Status: DC | PRN
  Administered 2011-12-21: 2 mg via ORAL
  Filled 2011-12-19: qty 2

## 2011-12-19 MED ORDER — HYDROMORPHONE HCL PF 1 MG/ML IJ SOLN
1.0000 mg | Freq: Once | INTRAMUSCULAR | Status: AC
  Administered 2011-12-19: 1 mg via INTRAVENOUS
  Filled 2011-12-19: qty 1

## 2011-12-19 MED ORDER — ALBUTEROL SULFATE HFA 108 (90 BASE) MCG/ACT IN AERS
2.0000 | INHALATION_SPRAY | Freq: Four times a day (QID) | RESPIRATORY_TRACT | Status: DC | PRN
  Filled 2011-12-19: qty 6.7

## 2011-12-19 MED ORDER — INSULIN ASPART 100 UNIT/ML ~~LOC~~ SOLN
0.0000 [IU] | Freq: Three times a day (TID) | SUBCUTANEOUS | Status: DC
  Administered 2011-12-20 (×3): 3 [IU] via SUBCUTANEOUS
  Administered 2011-12-21: 5 [IU] via SUBCUTANEOUS
  Administered 2011-12-21 – 2011-12-22 (×3): 3 [IU] via SUBCUTANEOUS
  Administered 2011-12-22: 5 [IU] via SUBCUTANEOUS

## 2011-12-19 MED ORDER — EXENATIDE 5 MCG/0.02ML ~~LOC~~ SOPN
5.0000 ug | PEN_INJECTOR | Freq: Every morning | SUBCUTANEOUS | Status: DC
Start: 2011-12-20 — End: 2011-12-22

## 2011-12-19 MED ORDER — MORPHINE SULFATE 4 MG/ML IJ SOLN
4.0000 mg | Freq: Once | INTRAMUSCULAR | Status: AC
  Administered 2011-12-19: 4 mg via INTRAVENOUS
  Filled 2011-12-19: qty 1

## 2011-12-19 MED ORDER — GABAPENTIN 100 MG PO CAPS
200.0000 mg | ORAL_CAPSULE | Freq: Two times a day (BID) | ORAL | Status: DC
  Administered 2011-12-19 – 2011-12-22 (×6): 200 mg via ORAL
  Filled 2011-12-19 (×7): qty 2

## 2011-12-19 MED ORDER — ACETAMINOPHEN 325 MG PO TABS: 650.0000 mg | ORAL_TABLET | Freq: Four times a day (QID) | ORAL | Status: DC | PRN

## 2011-12-19 MED ORDER — AMLODIPINE BESYLATE 10 MG PO TABS
10.0000 mg | ORAL_TABLET | Freq: Every day | ORAL | Status: DC
  Administered 2011-12-19 – 2011-12-22 (×4): 10 mg via ORAL
  Filled 2011-12-19 (×4): qty 1

## 2011-12-19 MED ORDER — FUROSEMIDE 10 MG/ML IJ SOLN
40.0000 mg | Freq: Three times a day (TID) | INTRAMUSCULAR | Status: DC
  Administered 2011-12-19 – 2011-12-21 (×6): 40 mg via INTRAVENOUS
  Filled 2011-12-19 (×8): qty 4

## 2011-12-19 MED ORDER — SODIUM CHLORIDE 0.9 % IJ SOLN: 3.0000 mL | INTRAMUSCULAR | Status: DC | PRN

## 2011-12-19 MED ORDER — ENOXAPARIN SODIUM 30 MG/0.3ML ~~LOC~~ SOLN: 30.0000 mg | SUBCUTANEOUS | Status: DC

## 2011-12-19 MED ORDER — LORAZEPAM 2 MG/ML IJ SOLN
1.0000 mg | Freq: Once | INTRAMUSCULAR | Status: AC
  Administered 2011-12-19: 1 mg via INTRAVENOUS
  Filled 2011-12-19: qty 1

## 2011-12-19 MED ORDER — SODIUM CHLORIDE 0.9 % IJ SOLN
3.0000 mL | Freq: Two times a day (BID) | INTRAMUSCULAR | Status: DC
  Administered 2011-12-19 – 2011-12-21 (×4): 3 mL via INTRAVENOUS

## 2011-12-19 MED ORDER — SODIUM CHLORIDE 0.9 % IV SOLN
Freq: Once | INTRAVENOUS | Status: AC
  Administered 2011-12-19: 13:00:00 via INTRAVENOUS

## 2011-12-19 MED ORDER — HYDRALAZINE HCL 50 MG PO TABS
50.0000 mg | ORAL_TABLET | Freq: Three times a day (TID) | ORAL | Status: DC
  Administered 2011-12-19 – 2011-12-22 (×9): 50 mg via ORAL
  Filled 2011-12-19 (×11): qty 1

## 2011-12-19 MED ORDER — ONDANSETRON HCL 4 MG/2ML IJ SOLN: 4.0000 mg | Freq: Four times a day (QID) | INTRAMUSCULAR | Status: DC | PRN

## 2011-12-19 MED ORDER — ACETAMINOPHEN 650 MG RE SUPP: 650.0000 mg | Freq: Four times a day (QID) | RECTAL | Status: DC | PRN

## 2011-12-19 NOTE — H&P (Addendum)
[facility] History and Physical  Debra Stokes VHQ:[id number] DOB: 1976/03/01 DOA: 12/19/2011  Referring physician: EDP PCP: Lonell Face, MD   Chief Complaint: shortness of breath, leg pain and swelling  HPI:  Debra Stokes is a 35/F w/ multiple medical problems significant for DM, HTN, chronic diastolic CHF, CKD3, COPD, morbid obesity, polysubstance use was recently discharged from [facility] 3 weeks ago after an admission for diastolic CHF exacerbation. The patient reports compliance with her medication, and diet however has not checked her weight since discharge. She reports gradually progressive shortness of breath worse on the last 3 days, associated with swelling in her feet, numbness and sharp pain in her feet, orthopnea, dry cough. Denies fevers or chills, denies wheezing, denies recent drug use. Upon evaluation in the emergency room, noted to be in hypertensive emergency with mild pulmonary edema.  Review of Systems:  12 system review done and negative except as noted in history of present illness  Past Medical History  Diagnosis Date  . Diabetes mellitus   . Asthma   . CHF (congestive heart failure)   . Chronic back pain   . Migraine headache   . Hypertension   . Morbid obesity   . Polysubstance abuse   . Sleep apnea   . Drug-seeking behavior   . Urinary tract infection   . Arthritis    Past Surgical History  Procedure Date  . Cholecystectomy   . Cardiac catheterization     two, no blockage   Social History:  reports that she has been smoking Cigarettes.  She has a 8 pack-year smoking history. She does not have any smokeless tobacco history on file. She reports that she does not drink alcohol or use illicit drugs. Lives at home with girl friend and kids Smokes 1/2 PPD, denies ETOH or drug use, although UDS positive for cocaine 3 weeks ago  No Known Allergies  Family History  Problem Relation Age of Onset  . Other Neg Hx   unknown per patient  Prior  to Admission medications   Medication Sig Start Date End Date Taking? Authorizing Provider  albuterol (PROVENTIL HFA;VENTOLIN HFA) 108 (90 BASE) MCG/ACT inhaler Inhale 2 puffs into the lungs every 6 (six) hours as needed. Asthma attacks 11/28/11  Yes Dorothea Ogle, MD  amLODipine (NORVASC) 10 MG tablet Take 1 tablet (10 mg total) by mouth daily. 11/28/11 11/27/12 Yes Dorothea Ogle, MD  aspirin EC 81 MG EC tablet Take 1 tablet (81 mg total) by mouth daily. 11/28/11 11/27/12 Yes Iskra Aurther Loft, MD  carvedilol (COREG) 6.25 MG tablet Take 1 tablet (6.25 mg total) by mouth 2 (two) times daily with a meal. 11/28/11 11/27/12 Yes Dorothea Ogle, MD  exenatide (BYETTA) 5 MCG/0.02ML SOLN Inject 5 mcg into the skin every morning. 11/04/11  Yes Peggy Constant, MD  furosemide (LASIX) 20 MG tablet Take 1 tablet (20 mg total) by mouth daily. 11/28/11 11/27/12 Yes Iskra Aurther Loft, MD  hydrALAZINE (APRESOLINE) 25 MG tablet Take 1 tablet (25 mg total) by mouth every 8 (eight) hours. 11/28/11 11/27/12 Yes Iskra Aurther Loft, MD  insulin aspart (NOVOLOG) 100 UNIT/ML injection Inject 0-20 Units into the skin 3 (three) times daily with meals. 11/04/11 11/03/12 Yes Peggy Constant, MD  insulin aspart (NOVOLOG) 100 UNIT/ML injection Inject 3-20 Units into the skin at bedtime. 11/04/11 11/03/12 Yes Peggy Constant, MD  insulin glargine (LANTUS) 100 UNIT/ML injection Inject 40 Units into the skin daily. 11/04/11 11/03/12 Yes Peggy Constant, MD  labetalol (NORMODYNE)  300 MG tablet Take 2 tablets (600 mg total) by mouth 3 (three) times daily. 11/28/11 11/27/12 Yes Iskra Aurther Loft, MD  LORazepam (ATIVAN) 2 MG tablet Take 1 tablet (2 mg total) by mouth every 6 (six) hours as needed for anxiety. For anxiety. 11/28/11  Yes Dorothea Ogle, MD  potassium chloride SA (K-DUR,KLOR-CON) 20 MEQ tablet Take 2 tablets (40 mEq total) by mouth daily. 11/28/11 11/27/12 Yes Iskra Aurther Loft, MD   Physical Exam: Filed Vitals:   12/19/11 1405 12/19/11 1430 12/19/11 1500 12/19/11 1541   BP: 183/112 204/131 205/127   Pulse: 78 81 83   Temp:    98.1 F (36.7 C)  TempSrc:    Oral  Resp: 19 19 18    Weight:      SpO2:         General:  Alert awake oriented x3, uncomfortable secondary to pain in her feet, not in respiratory distress  Eyes: Pupils equal and reactive, muddy sclera, no icterus  ENT: Oral mucosa moist and pink  Neck: Obese, dark discoloration, unable to appreciate JVD  Cardiovascular: S1S2/RRR, no m/r/g  Respiratory: Distant breath sounds, poor air movement bilaterally  Abdomen: Obese soft nontender with normal bowel sounds  Skin: No rashes or skin breakdown  Psychiatric: Appropriate mood and affect  Neurologic: Moves all extremities no localizing sign   Extremities: 1+ edema bilaterally, paresthesias in both feet      Labs on Admission:  Basic Metabolic Panel:  Lab 12/19/11 1610  NA 136  K 4.4  CL 98  CO2 27  GLUCOSE 164*  BUN 18  CREATININE 1.44*  CALCIUM 9.8  MG --  PHOS --   Liver Function Tests:  Lab 12/19/11 1310  AST 15  ALT 13  ALKPHOS 55  BILITOT 0.5  PROT 8.2  ALBUMIN 3.9   No results found for this basename: LIPASE:5,AMYLASE:5 in the last 168 hours No results found for this basename: AMMONIA:5 in the last 168 hours CBC:  Lab 12/19/11 1310  WBC 6.9  NEUTROABS 4.8  HGB 12.8  HCT 39.6  MCV 89.6  PLT 295   Cardiac Enzymes: No results found for this basename: CKTOTAL:5,CKMB:5,CKMBINDEX:5,TROPONINI:5 in the last 168 hours  BNP (last 3 results)  Basename 12/19/11 1310 11/25/11 1525 11/11/11 2106  PROBNP 2539.0* 3786.0* 4164.0*   CBG: No results found for this basename: GLUCAP:5 in the last 168 hours  Radiological Exams on Admission: Dg Chest 2 View  12/19/2011  *RADIOLOGY REPORT*  Clinical Data: Chest pain, shortness of breath, smoking history  CHEST - 2 VIEW  Comparison: Chest x-ray of 11/25/2011  Findings: Cardiomegaly is stable.  No focal infiltrate or effusion is seen.  There may be mild pulmonary  vascular congestion present, but no effusion is seen.  No acute bony abnormality is noted.  IMPRESSION: Stable moderate cardiomegaly.  Question mild pulmonary vascular congestion.  Original Report Authenticated By: Juline Patch, M.D.    EKG: Independently reviewed.   Assessment/Plan  1. Acute on Chronic Diastolic CHF Lasix 40mg  IV Q8 Nitroglycerin drip Continue Coreg, ASA Daily weights, I/Os Triggered by hypertensive emergency and suspected cocaine, UDS pending  NSAID use could have exacerbated this well. Recent ECHO 7/15, EF 60% , grade 2 diastolic dysfunction  2. Hypertensive emergency:  Suspect cocaine use, UDS pending nitroglycerin drip, resume amlodipine/hydralazine  3. CKD III:  Creatinine improved from baseline, baseline creatinine around 2  4. DM:  Resume lantus, byetta, SSI  5. COPD: stable Albuterol PRN  6. Polysubstance  abuse: UDS positive for cocaine 3 weeks ago, adamantly declines using it, repeat UDS today  7. Morbid obesity: life style modification  8. Diabetic peripheral neuropathy suspected: add neurontin  Code Status: Full Disposition Plan: home when stable  Time spent:  [facility] Pager [phone]  If 7PM-7AM, please contact night-coverage [email or web address] Password [email or web address] 12/19/2011, 4:32 PM  Addendum: UDS positive for cocaine Will DC beta blockers  Zannie Cove, MD [phone]

## 2011-12-19 NOTE — ED Provider Notes (Signed)
History     CSN: [id number]  Arrival date & time 12/19/11  1139   First MD Initiated Contact with Patient 12/19/11 1231      Chief Complaint  Patient presents with  . Leg Swelling  . Shortness of Breath    (Consider location/radiation/quality/duration/timing/severity/associated sxs/prior treatment) Patient is a 36 y.o. female presenting with shortness of breath. The history is provided by the patient.  Shortness of Breath  The current episode started yesterday. The problem occurs continuously. The problem has been rapidly worsening. The problem is moderate. Nothing aggravates the symptoms. Associated symptoms include chest pain, orthopnea and shortness of breath. Pertinent negatives include no fever, no cough and no wheezing.   Pt also notes Bilateral foot pain worse with ambulation, cp non pleuritic or anginal--notes compliance with meds Past Medical History  Diagnosis Date  . Diabetes mellitus   . Asthma   . CHF (congestive heart failure)   . Chronic back pain   . Migraine headache   . Hypertension   . Morbid obesity   . Polysubstance abuse   . Sleep apnea   . Drug-seeking behavior   . Urinary tract infection   . Arthritis     Past Surgical History  Procedure Date  . Cholecystectomy   . Cardiac catheterization     two, no blockage    Family History  Problem Relation Age of Onset  . Other Neg Hx     History  Substance Use Topics  . Smoking status: Current Everyday Smoker -- 0.5 packs/day for 16 years    Types: Cigarettes  . Smokeless tobacco: Not on file  . Alcohol Use: No     quit 5 yrs ago    OB History    Grav Para Term Preterm Abortions TAB SAB Ect Mult Living   1    1  1    0      Review of Systems  Constitutional: Negative for fever.  Respiratory: Positive for shortness of breath. Negative for cough and wheezing.   Cardiovascular: Positive for chest pain and orthopnea.  All other systems reviewed and are negative.    Allergies  Review of  patient's allergies indicates no known allergies.  Home Medications   Current Outpatient Rx  Name Route Sig Dispense Refill  . ALBUTEROL SULFATE HFA 108 (90 BASE) MCG/ACT IN AERS Inhalation Inhale 2 puffs into the lungs every 6 (six) hours as needed. Asthma attacks 1 Inhaler 3  . AMLODIPINE BESYLATE 10 MG PO TABS Oral Take 1 tablet (10 mg total) by mouth daily. 30 tablet 3  . ASPIRIN 81 MG PO TBEC Oral Take 1 tablet (81 mg total) by mouth daily. 31 tablet 3  . CARVEDILOL 6.25 MG PO TABS Oral Take 1 tablet (6.25 mg total) by mouth 2 (two) times daily with a meal. 62 tablet 3  . EXENATIDE 5 MCG/0.02ML [place] SOLN Subcutaneous Inject 5 mcg into the skin every morning.    . FUROSEMIDE 20 MG PO TABS Oral Take 1 tablet (20 mg total) by mouth daily. 30 tablet 3  . HYDRALAZINE HCL 25 MG PO TABS Oral Take 1 tablet (25 mg total) by mouth every 8 (eight) hours. 90 tablet 3  . INSULIN ASPART 100 UNIT/ML [place] SOLN Subcutaneous Inject 0-20 Units into the skin 3 (three) times daily with meals. 1 vial 5  . INSULIN ASPART 100 UNIT/ML [place] SOLN Subcutaneous Inject 3-20 Units into the skin at bedtime.    . INSULIN GLARGINE 100 UNIT/ML [place] SOLN  Subcutaneous Inject 40 Units into the skin daily. 10 mL 5  . LABETALOL HCL 300 MG PO TABS Oral Take 2 tablets (600 mg total) by mouth 3 (three) times daily. 180 tablet 3  . LORAZEPAM 2 MG PO TABS Oral Take 1 tablet (2 mg total) by mouth every 6 (six) hours as needed for anxiety. For anxiety. 30 tablet 0  . POTASSIUM CHLORIDE CRYS ER 20 MEQ PO TBCR Oral Take 2 tablets (40 mEq total) by mouth daily. 30 tablet 0    BP 189/106  Pulse 73  Temp 98.9 F (37.2 C) (Oral)  Resp 20  Wt 348 lb (157.852 kg)  SpO2 100%  LMP 11/23/2011  Physical Exam  Nursing note and vitals reviewed. Constitutional: She is oriented to person, place, and time. She appears well-developed and well-nourished.  Non-toxic appearance. No distress.  HENT:  Head: Normocephalic and atraumatic.  Eyes:  Conjunctivae, EOM and lids are normal. Pupils are equal, round, and reactive to light.  Neck: Normal range of motion. Neck supple. No tracheal deviation present. No mass present.  Cardiovascular: Normal rate, regular rhythm and normal heart sounds.  Exam reveals no gallop.   No murmur heard. Pulmonary/Chest: Effort normal. No stridor. No respiratory distress. She has decreased breath sounds. She has no wheezes. She has no rhonchi. She has no rales.  Abdominal: Soft. Normal appearance and bowel sounds are normal. She exhibits no distension. There is no tenderness. There is no rebound and no CVA tenderness.  Musculoskeletal: Normal range of motion. She exhibits no edema and no tenderness.       1 plus bilat le edema  Neurological: She is alert and oriented to person, place, and time. She has normal strength. No cranial nerve deficit or sensory deficit. GCS eye subscore is 4. GCS verbal subscore is 5. GCS motor subscore is 6.  Skin: Skin is warm and dry. No abrasion and no rash noted.  Psychiatric: She has a normal mood and affect. Her speech is normal and behavior is normal.    ED Course  Procedures (including critical care time)   Labs Reviewed  CBC WITH DIFFERENTIAL  PRO B NATRIURETIC PEPTIDE  D-DIMER, QUANTITATIVE  BASIC METABOLIC PANEL  URINALYSIS, ROUTINE W REFLEX MICROSCOPIC   No results found.   No diagnosis found.    MDM  Pt given pain meds and placed on nitro, will be admitted for chf and htn control   Date: 12/19/2011  Rate: 75  Rhythm: normal sinus rhythm  QRS Axis: normal  Intervals: normal  ST/T Wave abnormalities: nonspecific ST changes  Conduction Disutrbances:none  Narrative Interpretation:   Old EKG Reviewed: unchanged   CRITICAL CARE Performed by: Toy Baker   Total critical care time: 45  Critical care time was exclusive of separately billable procedures and treating other patients.  Critical care was necessary to treat or prevent imminent or  life-threatening deterioration.  Critical care was time spent personally by me on the following activities: development of treatment plan with patient and/or surrogate as well as nursing, discussions with consultants, evaluation of patient's response to treatment, examination of patient, obtaining history from patient or surrogate, ordering and performing treatments and interventions, ordering and review of laboratory studies, ordering and review of radiographic studies, pulse oximetry and re-evaluation of patient's condition.         Toy Baker, MD 12/19/11 [phone]

## 2011-12-19 NOTE — Progress Notes (Signed)
Debra Stokes, is a 36 y.o. female,   MRN: [id number]  -  DOB - [birth year]  Outpatient Primary MD for the patient is Lonell Face, MD  in for    Chief Complaint  Patient presents with  . Leg Swelling  . Shortness of Breath     Blood pressure 204/131, pulse 81, temperature 98.9 F (37.2 C), temperature source Oral, resp. rate 19, weight 157.852 kg (348 lb), last menstrual period 11/23/2011, SpO2 98.00%.  Principal Problem:  *Hypertension, uncontrolled Active Problems:  Diabetes mellitus type 2 with complications, uncontrolled  Morbid obesity with BMI of 50.0-59.9, adult  Chronic renal insufficiency, stage III (moderate)  COPD (chronic obstructive pulmonary disease)  Obstructive sleep apnea  CHF (congestive heart failure)  Chest pain  Hypokalemia  36 yo hx morbid obesity, COPD, CHF, HTN, DM asthma, presents to ED cc persistent leg swelling and sob. She reports that onset gradual over "a while" but has worsened last night to today. Also developed CP and orthopnea. Denies fever, chills, nausea, vomiting, cough, wheeze. She reports compliance with meds and daily weights. Also reports bilateral foot pain very painful to touch/ambulation.   In Ed SBP range 183-204, DBP range 103-131 HR 70-80 R 19. Chest xray stable moderate cardiomegaly with ? Mild pulmonary vascular congestion. Of note pt discharged 11/26/11 after CHF exacerbation. Pro BNP 2539, d-dimer 0.30 Troponin 0.02. Nitro drip started, lasix given in ED. VSS admit to SD.

## 2011-12-19 NOTE — ED Notes (Signed)
Report given to pam, rn

## 2011-12-19 NOTE — ED Notes (Signed)
Patient stated that she was in pain made nurse aware

## 2011-12-19 NOTE — ED Notes (Signed)
Pt states "went to my doctor last Thursday, didn't even get to see him, the nurse just handed me my medicines and sent me out, they told me if I got to feeling short of breath to come in to the ER"; pt NAD @ this time.

## 2011-12-20 DIAGNOSIS — I5031 Acute diastolic (congestive) heart failure: Secondary | ICD-10-CM

## 2011-12-20 DIAGNOSIS — E118 Type 2 diabetes mellitus with unspecified complications: Secondary | ICD-10-CM

## 2011-12-20 DIAGNOSIS — IMO0002 Reserved for concepts with insufficient information to code with codable children: Secondary | ICD-10-CM

## 2011-12-20 LAB — BASIC METABOLIC PANEL
Calcium: 9.1 mg/dL (ref 8.4–10.5)
Chloride: 94 mEq/L — ABNORMAL LOW (ref 96–112)
Creatinine, Ser: 1.45 mg/dL — ABNORMAL HIGH (ref 0.50–1.10)
GFR calc Af Amer: 53 mL/min — ABNORMAL LOW (ref >90)
Sodium: 133 mEq/L — ABNORMAL LOW (ref 135–145)

## 2011-12-20 LAB — GLUCOSE, CAPILLARY
Glucose-Capillary: 156 mg/dL — ABNORMAL HIGH (ref 70–99)
Glucose-Capillary: 130 mg/dL — ABNORMAL HIGH (ref 70–99)

## 2011-12-20 LAB — CBC
MCV: 89.6 fL (ref 78.0–100.0)
Platelets: 304 10*3/uL (ref 150–400)
RBC: 4.22 MIL/uL (ref 3.87–5.11)
RDW: 16 % — ABNORMAL HIGH (ref 11.5–15.5)
WBC: 7.5 10*3/uL (ref 4.0–10.5)

## 2011-12-20 LAB — LIPID PANEL
HDL: 54 mg/dL (ref >39)
Total CHOL/HDL Ratio: 2.5 RATIO
VLDL: 31 mg/dL (ref 0–40)

## 2011-12-20 LAB — MRSA PCR SCREENING: MRSA by PCR: NEGATIVE

## 2011-12-20 LAB — CARDIAC PANEL(CRET KIN+CKTOT+MB+TROPI)
Relative Index: 1.8 (ref 0.0–2.5)
Total CK: 115 U/L (ref 7–177)
Total CK: 113 U/L (ref 7–177)

## 2011-12-20 MED ORDER — NITROGLYCERIN IN D5W 200-5 MCG/ML-% IV SOLN
2.0000 ug/min | INTRAVENOUS | Status: DC
  Administered 2011-12-20: 20 ug/min via INTRAVENOUS

## 2011-12-20 MED ORDER — HYDRALAZINE HCL 20 MG/ML IJ SOLN: 5.0000 mg | Freq: Four times a day (QID) | INTRAMUSCULAR | Status: DC | PRN

## 2011-12-20 MED ORDER — CLONIDINE HCL 0.1 MG PO TABS
0.1000 mg | ORAL_TABLET | Freq: Two times a day (BID) | ORAL | Status: DC
  Administered 2011-12-20 – 2011-12-22 (×5): 0.1 mg via ORAL
  Filled 2011-12-20 (×6): qty 1

## 2011-12-20 MED ORDER — INSULIN GLARGINE 100 UNIT/ML ~~LOC~~ SOLN
40.0000 [IU] | Freq: Every day | SUBCUTANEOUS | Status: DC
  Administered 2011-12-20 – 2011-12-21 (×2): 40 [IU] via SUBCUTANEOUS

## 2011-12-20 NOTE — Progress Notes (Signed)
Pt was complaining of right sided chest pain. Patient's girlfriend was concerned so I paged the NP on call, K. Craige Cotta. Donnamarie Poag informed me the cardiac enzymes were negative so the pain was not heart attack related. Donnamarie Poag told me the pain is more than likely due to the pt's positive tox screen of cocaine. I went back into the room and informed the pt about the possibility of the pain being related to the cocaine abuse. Pt got verbally upset, threatening to file a report about the NP for forming assumptions. Pt claims that we would not know whether or not if she smoked cocaine. She became very defensive stating we would not know what is causing her chest pain just by looking at the baseline information. Pt later calmed down and the chest pain decreased. I educated the pt on the purpose of the nitroglycerin drip to help lower her BP and the reason for increasing the dose. I also informed the pt for why I was placing her on contact precautions for MRSA. I went over the other BP medications, pain medications, and lovenox.

## 2011-12-20 NOTE — Progress Notes (Signed)
WEANED PT. OFF OF NITRO DRIP, D/C COMPLETELY AT 1700, PT. IS TOLERATING WELL, WILL CONTINUE TO MONITOR AND OBSERVE PT.

## 2011-12-20 NOTE — Progress Notes (Signed)
I gave patient the heart failure packet and went over the zone tool with her. She is very proactive about improving her health. She is trying to get a scale so she can weigh her self daily and she uses salt substitutes like dash and pepper. She was very receptive to the teaching and stated she understood the information.

## 2011-12-20 NOTE — Progress Notes (Signed)
[facility] PROGRESS NOTE  Debra Stokes ONG:[id number] DOB: 02-14-1976 DOA: 12/19/2011 PCP: Lonell Face, MD  Assessment/Plan: 1. Acute diastolic CHF: Improving. Continue Lasix, daily weights. Beta-blocker on hold because of positive cocaine in UDS. Recent ECHO 7/15, EF 60% , grade 2 diastolic dysfunction. Chest pain reported last night resolved. 2. Hypertensive emergency: Resolved. Weaned off nitroglycerin infusion. Suspected secondary to cocaine, NSAID, non-compliance. Continue hydralazine, Norvasc, clonidine. Resume beta-blocker on discharge. 3. Diabetes mellitus, uncontrolled by HbA1c 10.8: Stable as an inpatient. Continue Byetta, Lantus, SSI. 4. CKD III: Stable. 5. Chronic back pain: Stable. Continue gabapentin. 6. Morbid obesity 7. UDS positive for cocaine: patient denies.  Code Status: full code Family Communication: none at bedside Disposition Plan: home when improved  Brendia Sacks, MD  [facility] Team 4 Pager [phone]. If 8PM-8AM, please contact night-coverage at [email or web address], password [facility] 12/20/2011, 3:14 PM  LOS: 1 day   Brief narrative: 35/F presented with gradually progressive shortness of breath worse on the last 3 days, associated with swelling in her feet. Upon evaluation in the emergency room, noted to be in hypertensive emergency with mild pulmonary edema.  Consultants:  none  Procedures:  none  HPI/Subjective: Feels better today. Complains of LLE edema.  Objective: Filed Vitals:   12/20/11 0429 12/20/11 0500 12/20/11 0534 12/20/11 1300  BP: 154/89  169/106 188/100  Pulse:   82 82  Temp:   98.2 F (36.8 C) 97.5 F (36.4 C)  TempSrc:   Oral Oral  Resp:   19 19  Height:      Weight:  156.672 kg (345 lb 6.4 oz)    SpO2:   96% 92%    Intake/Output Summary (Last 24 hours) at 12/20/11 1514 Last data filed at 12/20/11 1358  Gross per 24 hour  Intake  406.7 ml  Output   9100 ml  Net -8693.3 ml   Filed Weights   12/19/11 1202  12/19/11 1824 12/20/11 0500  Weight: 157.852 kg (348 lb) 157.6 kg (347 lb 7.1 oz) 156.672 kg (345 lb 6.4 oz)    Exam:   General:  Appears tearful, but comfortable.  Cardiovascular: RRR, no m/r/g. No LE edema.  Respiratory: CTA bilaterally, no w/r/r. Normal respiratory effort.  Data Reviewed: Basic Metabolic Panel:  Lab 12/20/11 2536 12/19/11 1310  NA 133* 136  K 3.9 4.4  CL 94* 98  CO2 27 27  GLUCOSE 133* 164*  BUN 17 18  CREATININE 1.45* 1.44*  CALCIUM 9.1 9.8  MG -- --  PHOS -- --   Liver Function Tests:  Lab 12/19/11 1310  AST 15  ALT 13  ALKPHOS 55  BILITOT 0.5  PROT 8.2  ALBUMIN 3.9   CBC:  Lab 12/20/11 0214 12/19/11 1310  WBC 7.5 6.9  NEUTROABS -- 4.8  HGB 12.1 12.8  HCT 37.8 39.6  MCV 89.6 89.6  PLT 304 295   Cardiac Enzymes:  Lab 12/20/11 1050 12/20/11 0214 12/19/11 1951  CKTOTAL 113 115 138  CKMB 2.2 2.1 2.6  CKMBINDEX -- -- --  TROPONINI <0.30 <0.30 <0.30   BNP (last 3 results)  Basename 12/19/11 1310 11/25/11 1525 11/11/11 2106  PROBNP 2539.0* 3786.0* 4164.0*   CBG:  Lab 12/20/11 1134 12/20/11 0731 12/19/11 2106 12/19/11 1827  GLUCAP 156* 181* 169* 119*    Recent Results (from the past 240 hour(s))  MRSA PCR SCREENING     Status: Normal   Collection Time   12/20/11  2:31 AM      Component Value Range Status  Comment   MRSA by PCR NEGATIVE  NEGATIVE Final      Studies: Dg Chest 2 View  12/19/2011  *RADIOLOGY REPORT*  Clinical Data: Chest pain, shortness of breath, smoking history  CHEST - 2 VIEW  Comparison: Chest x-ray of 11/25/2011  Findings: Cardiomegaly is stable.  No focal infiltrate or effusion is seen.  There may be mild pulmonary vascular congestion present, but no effusion is seen.  No acute bony abnormality is noted.  IMPRESSION: Stable moderate cardiomegaly.  Question mild pulmonary vascular congestion.  Original Report Authenticated By: Juline Patch, M.D.   Scheduled Meds:   . amLODipine  10 mg Oral Daily  . aspirin  EC  81 mg Oral Daily  . enoxaparin (LOVENOX) injection  80 mg Subcutaneous Q24H  . exenatide  5 mcg Subcutaneous q morning - 10a  . furosemide  40 mg Intravenous Q8H  . gabapentin  200 mg Oral BID  . hydrALAZINE  50 mg Oral Q8H  . HYDROmorphone  1 mg Intravenous Once  . insulin aspart  0-15 Units Subcutaneous TID WC  . insulin glargine  40 Units Subcutaneous Q2000  . LORazepam  1 mg Intravenous Once  . potassium chloride SA  40 mEq Oral Daily  . sodium chloride  3 mL Intravenous Q12H  . DISCONTD: enoxaparin (LOVENOX) injection  30 mg Subcutaneous Q24H  . DISCONTD: hydrALAZINE  25 mg Oral Q8H  . DISCONTD: hydrALAZINE  50 mg Oral Q8H  . DISCONTD: insulin glargine  40 Units Subcutaneous Daily   Continuous Infusions:   . nitroGLYCERIN 20 mcg/min (12/20/11 1358)  . DISCONTD: nitroGLYCERIN 20 mcg/min (12/19/11 2344)    Principal Problem:  *Hypertension, uncontrolled Active Problems:  Diabetes mellitus type 2 with complications, uncontrolled  Morbid obesity with BMI of 50.0-59.9, adult  Chronic renal insufficiency, stage III (moderate)  COPD (chronic obstructive pulmonary disease)  Obstructive sleep apnea  CHF (congestive heart failure)  Hypokalemia  Diastolic CHF, acute on chronic  Neuropathy of both feet  Hypertensive emergency     Brendia Sacks, MD  [facility] Team 4 Pager [phone]. If 8PM-8AM, please contact night-coverage at [email or web address], password [facility] 12/20/2011, 3:14 PM  LOS: 1 day   Time spent: 25 minutes

## 2011-12-20 NOTE — Progress Notes (Signed)
Responded to spiritual care consult.    Debra Stokes spoke with chaplain about significant stress due to caring for several children and tension within family system.  She is feeling overwhelmed, exhausted and in emotional pain re: care of young children.  Also experiencing stress related to former girlfriend who is now in relationship with Debra Stokes's neighbor while Debra Stokes cares for child they used to raise together.   Chaplain provided emotional, spiritual support and empathic presence, worked with Debra Stokes on coping skills, recognizing pain around claiming space for her self and her own healing, recognizing resources and coping skills.  Prayed with Debra Stokes.  Will continue to follow during admission for support around stress, exhaustion and family tension.    Debra Stokes  MDiv, Chaplain    12/20/11 1700  Clinical Encounter Type  Visited With Patient  Visit Type Initial;Psychological support;Spiritual support;Social support  Referral From Nurse  Consult/Referral To Chaplain  Recommendations Follow up for support around stress and life changes  Spiritual Encounters  Spiritual Needs Emotional;Grief support;Prayer  Stress Factors  Patient Stress Factors Major life changes;Family relationships;Exhausted

## 2011-12-21 LAB — GLUCOSE, CAPILLARY
Glucose-Capillary: 239 mg/dL — ABNORMAL HIGH (ref 70–99)
Glucose-Capillary: 168 mg/dL — ABNORMAL HIGH (ref 70–99)

## 2011-12-21 MED ORDER — FUROSEMIDE 40 MG PO TABS
40.0000 mg | ORAL_TABLET | Freq: Two times a day (BID) | ORAL | Status: DC
  Administered 2011-12-21 – 2011-12-22 (×2): 40 mg via ORAL
  Filled 2011-12-21 (×5): qty 1

## 2011-12-21 MED ORDER — HYDROMORPHONE HCL PF 1 MG/ML IJ SOLN
0.5000 mg | INTRAMUSCULAR | Status: DC | PRN
  Administered 2011-12-22 (×4): 0.5 mg via INTRAVENOUS
  Filled 2011-12-21 (×5): qty 1

## 2011-12-21 MED ORDER — HYDROCODONE-ACETAMINOPHEN 5-325 MG PO TABS
1.0000 | ORAL_TABLET | ORAL | Status: DC | PRN
  Administered 2011-12-21 (×2): 2 via ORAL
  Administered 2011-12-21: 1 via ORAL
  Administered 2011-12-22 (×4): 2 via ORAL
  Filled 2011-12-21: qty 2
  Filled 2011-12-21: qty 1
  Filled 2011-12-21 (×5): qty 2

## 2011-12-21 NOTE — Care Management Note (Signed)
    Page 1 of 1   12/22/2011     2:37:42 PM   CARE MANAGEMENT NOTE 12/22/2011  Patient:  [facility]   Account Number:  [id number]  Date Initiated:  12/20/2011  Documentation initiated by:  Lanier Clam  Subjective/Objective Assessment:   ADMITTED W/SOB. HX:CHF.READMIT 7/13-7/16/13.     Action/Plan:   FROM HOME W/SUPPORT.HOME 02-[facility].ACTIVE W/[facility]-HHRN/PT   Anticipated DC Date:  12/23/2011   Anticipated DC Plan:  HOME W HOME HEALTH SERVICES      DC Planning Services  CM consult      [facility] Choice  Resumption Of Svcs/PTA Provider   Choice offered to / List presented to:          [facility] arranged  HH-1 RN  HH-2 PT      [facility] agency  [facility]   Status of service:  Completed, signed off Medicare Important Message given?   (If response is "NO", the following Medicare IM given date fields will be blank) Date Medicare IM given:   Date Additional Medicare IM given:    Discharge Disposition:  HOME W HOME HEALTH SERVICES  Per UR Regulation:  Reviewed for med. necessity/level of care/duration of stay  If discussed at Long Length of Stay Meetings, dates discussed:    Comments:  12/20/11 Raevyn Sokol RN,BSN NCM [phone] [facility] INFOMRED OF ADMISSION,& FOLLOWING FOR RESUMPTION OF HHRN/PT IF MD AGREE.

## 2011-12-21 NOTE — Progress Notes (Signed)
[facility] PROGRESS NOTE  Debra Stokes GUY:[id number] DOB: [birth year] DOA: 12/19/2011 PCP: Lonell Face, MD  Assessment/Plan: 1. Acute diastolic CHF: Continues to improve. Change Lasix to oral, continue daily weights. Beta-blocker on hold because of positive cocaine in UDS. Recent ECHO 7/15, EF 60% , grade 2 diastolic dysfunction.  2. Hypertensive emergency: Resolved. Suspected secondary to cocaine, NSAID, non-compliance. Blood pressure control improved. Continue hydralazine, Norvasc, clonidine. Resume beta-blocker on discharge. 3. Diabetes mellitus, uncontrolled by HbA1c 10.8: Stable as an inpatient. Continue Byetta, Lantus, SSI. 4. CKD III: Stable. 5. Chronic back pain: Stable. Continue gabapentin. 6. Morbid obesity 7. UDS positive for cocaine: patient denies.  Code Status: full code Family Communication: none at bedside Disposition Plan: home 8/9  Brendia Sacks, MD  [facility] Team 4 Pager [phone]. If 8PM-8AM, please contact night-coverage at [email or web address], password [facility] 12/21/2011, 6:36 PM  LOS: 2 days   Brief narrative: 35/F presented with gradually progressive shortness of breath worse on the last 3 days, associated with swelling in her feet. Upon evaluation in the emergency room, noted to be in hypertensive emergency with mild pulmonary edema.  Consultants:  none  Procedures:  none  HPI/Subjective: Feels better today. Complains of chronic sciatica right leg.  Objective: Filed Vitals:   12/21/11 0302 12/21/11 0455 12/21/11 1013 12/21/11 1300  BP: 146/84 162/99 160/101 139/90  Pulse: 82 74    Temp: 98 F (36.7 C) 97.5 F (36.4 C) 97.7 F (36.5 C)   TempSrc: Oral Oral Oral   Resp: 16 16 16 16   Height:      Weight:      SpO2: 93% 95% 95% 93%    Intake/Output Summary (Last 24 hours) at 12/21/11 1836 Last data filed at 12/21/11 1700  Gross per 24 hour  Intake    960 ml  Output   4900 ml  Net  -3940 ml   Filed Weights   12/19/11 1202  12/19/11 1824 12/20/11 0500  Weight: 157.852 kg (348 lb) 157.6 kg (347 lb 7.1 oz) 156.672 kg (345 lb 6.4 oz)    Exam:   General:  Appears calm and comfortable.  Cardiovascular: RRR, no m/r/g. 1+ bilateral LE edema.  Respiratory: CTA bilaterally, no w/r/r. Normal respiratory effort.  Musculoskeletal: poor effort moving right leg, no focal deficit appreciated  Data Reviewed: Basic Metabolic Panel:  Lab 12/20/11 4332 12/19/11 1310  NA 133* 136  K 3.9 4.4  CL 94* 98  CO2 27 27  GLUCOSE 133* 164*  BUN 17 18  CREATININE 1.45* 1.44*  CALCIUM 9.1 9.8  MG -- --  PHOS -- --   Liver Function Tests:  Lab 12/19/11 1310  AST 15  ALT 13  ALKPHOS 55  BILITOT 0.5  PROT 8.2  ALBUMIN 3.9   CBC:  Lab 12/20/11 0214 12/19/11 1310  WBC 7.5 6.9  NEUTROABS -- 4.8  HGB 12.1 12.8  HCT 37.8 39.6  MCV 89.6 89.6  PLT 304 295   Cardiac Enzymes:  Lab 12/20/11 1050 12/20/11 0214 12/19/11 1951  CKTOTAL 113 115 138  CKMB 2.2 2.1 2.6  CKMBINDEX -- -- --  TROPONINI <0.30 <0.30 <0.30   BNP (last 3 results)  Basename 12/19/11 1310 11/25/11 1525 11/11/11 2106  PROBNP 2539.0* 3786.0* 4164.0*   CBG:  Lab 12/21/11 1804 12/21/11 1136 12/21/11 0817 12/20/11 2134 12/20/11 1700  GLUCAP 195* 161* 239* 130* 178*    Recent Results (from the past 240 hour(s))  MRSA PCR SCREENING     Status: Normal  Collection Time   12/20/11  2:31 AM      Component Value Range Status Comment   MRSA by PCR NEGATIVE  NEGATIVE Final      Studies: Dg Chest 2 View  12/19/2011  *RADIOLOGY REPORT*  Clinical Data: Chest pain, shortness of breath, smoking history  CHEST - 2 VIEW  Comparison: Chest x-ray of 11/25/2011  Findings: Cardiomegaly is stable.  No focal infiltrate or effusion is seen.  There may be mild pulmonary vascular congestion present, but no effusion is seen.  No acute bony abnormality is noted.  IMPRESSION: Stable moderate cardiomegaly.  Question mild pulmonary vascular congestion.  Original Report  Authenticated By: Juline Patch, M.D.   Scheduled Meds:    . amLODipine  10 mg Oral Daily  . aspirin EC  81 mg Oral Daily  . cloNIDine  0.1 mg Oral BID  . enoxaparin (LOVENOX) injection  80 mg Subcutaneous Q24H  . exenatide  5 mcg Subcutaneous q morning - 10a  . furosemide  40 mg Intravenous Q8H  . gabapentin  200 mg Oral BID  . hydrALAZINE  50 mg Oral Q8H  . insulin aspart  0-15 Units Subcutaneous TID WC  . insulin glargine  40 Units Subcutaneous Q2000  . potassium chloride SA  40 mEq Oral Daily  . sodium chloride  3 mL Intravenous Q12H   Continuous Infusions:   Principal Problem:  *Diastolic CHF, acute on chronic Active Problems:  Diabetes mellitus type 2 with complications, uncontrolled  Hypertension, uncontrolled  Morbid obesity with BMI of 50.0-59.9, adult  Chronic renal insufficiency, stage III (moderate)  Obstructive sleep apnea  Hypokalemia  Neuropathy of both feet  Hypertensive emergency     Brendia Sacks, MD  [facility] Team 4 Pager [phone]. If 8PM-8AM, please contact night-coverage at [email or web address], password [facility] 12/21/2011, 6:36 PM  LOS: 2 days   Time spent: 15 minutes

## 2011-12-22 LAB — BASIC METABOLIC PANEL
Chloride: 91 mEq/L — ABNORMAL LOW (ref 96–112)
Creatinine, Ser: 1.68 mg/dL — ABNORMAL HIGH (ref 0.50–1.10)
GFR calc Af Amer: 45 mL/min — ABNORMAL LOW (ref >90)

## 2011-12-22 LAB — GLUCOSE, CAPILLARY: Glucose-Capillary: 174 mg/dL — ABNORMAL HIGH (ref 70–99)

## 2011-12-22 MED ORDER — ACETAMINOPHEN 325 MG PO TABS: 650.0000 mg | ORAL_TABLET | Freq: Four times a day (QID) | ORAL | Status: DC | PRN

## 2011-12-22 MED ORDER — HYDRALAZINE HCL 25 MG PO TABS: 50.0000 mg | ORAL_TABLET | Freq: Three times a day (TID) | ORAL | Status: DC

## 2011-12-22 MED ORDER — DILTIAZEM HCL ER COATED BEADS 120 MG PO CP24: 120.0000 mg | ORAL_CAPSULE | Freq: Every day | ORAL | Status: DC

## 2011-12-22 MED ORDER — CLONIDINE HCL 0.1 MG PO TABS: 0.1000 mg | ORAL_TABLET | Freq: Two times a day (BID) | ORAL | Status: DC

## 2011-12-22 NOTE — Progress Notes (Signed)
[facility] PROGRESS NOTE  Debra Stokes ZOX:[id number] DOB: 1976/05/05 DOA: 12/19/2011 PCP: Lonell Face, MD  Assessment/Plan: 1. Acute diastolic CHF: Resolved. Continue Lasix. Recent ECHO 7/15, EF 60%, grade 2 diastolic dysfunction.  2. Hypertensive emergency: Resolved. Suspected secondary to cocaine, NSAID, non-compliance. Blood pressure control improved. Continue hydralazine, Norvasc, clonidine. Avoid beta-blocker given admitted regular handling of cocaine (cannot rule out use, though patient denies). 3. Diabetes mellitus, uncontrolled by HbA1c 10.8: Stable as an inpatient. Continue Byetta, Lantus, SSI. 4. CKD III: Stable. 5. Chronic back pain: Stable. Follow-up as an outpatient. 6. Morbid obesity 7. UDS positive for cocaine: patient denies use, but does handle regularly  Appears much improved. Home today.  Staff reports patient inquired into hospital policy regarding sexual intercourse during hospital stay.  Code Status: full code Family Communication: none at bedside Disposition Plan: home today  Brendia Sacks, MD  [facility] Team 4 Pager [phone]. If 8PM-8AM, please contact night-coverage at [email or web address], password [facility] 12/22/2011, 1:17 PM  LOS: 3 days   Brief narrative: 35/F presented with gradually progressive shortness of breath worse on the last 3 days, associated with swelling in her feet. Upon evaluation in the emergency room, noted to be in hypertensive emergency with mild pulmonary edema.  Consultants:  none  Procedures:  none  HPI/Subjective: Rn reported 16-beat VT last night--telemetry reviewed and shows 2-beat VT with short run of SVT. Feels better today, ready to go home.  Objective: Filed Vitals:   12/21/11 2301 12/22/11 0219 12/22/11 0315 12/22/11 0547  BP: 131/91 151/90 116/81 156/91  Pulse:    70  Temp:    98.4 F (36.9 C)  TempSrc:    Oral  Resp:    16  Height:      Weight:    156.7 kg (345 lb 7.4 oz)  SpO2:    95%     Intake/Output Summary (Last 24 hours) at 12/22/11 1317 Last data filed at 12/22/11 0700  Gross per 24 hour  Intake    545 ml  Output      0 ml  Net    545 ml   Filed Weights   12/19/11 1824 12/20/11 0500 12/22/11 0547  Weight: 157.6 kg (347 lb 7.1 oz) 156.672 kg (345 lb 6.4 oz) 156.7 kg (345 lb 7.4 oz)    Exam:   General:  Appears calm and comfortable.  Cardiovascular: RRR, no m/r/g. 1+ bilateral LE edema.  Respiratory: CTA bilaterally, no w/r/r. Normal respiratory effort.  Data Reviewed: Basic Metabolic Panel:  Lab 12/22/11 8295 12/20/11 0214 12/19/11 1310  NA 134* 133* 136  K 3.6 3.9 4.4  CL 91* 94* 98  CO2 29 27 27   GLUCOSE 173* 133* 164*  BUN 27* 17 18  CREATININE 1.68* 1.45* 1.44*  CALCIUM 9.5 9.1 9.8  MG 1.9 -- --  PHOS -- -- --   Liver Function Tests:  Lab 12/19/11 1310  AST 15  ALT 13  ALKPHOS 55  BILITOT 0.5  PROT 8.2  ALBUMIN 3.9   CBC:  Lab 12/20/11 0214 12/19/11 1310  WBC 7.5 6.9  NEUTROABS -- 4.8  HGB 12.1 12.8  HCT 37.8 39.6  MCV 89.6 89.6  PLT 304 295   Cardiac Enzymes:  Lab 12/20/11 1050 12/20/11 0214 12/19/11 1951  CKTOTAL 113 115 138  CKMB 2.2 2.1 2.6  CKMBINDEX -- -- --  TROPONINI <0.30 <0.30 <0.30   BNP (last 3 results)  Basename 12/19/11 1310 11/25/11 1525 11/11/11 2106  PROBNP 2539.0* 3786.0* 4164.0*  CBG:  Lab 12/22/11 1146 12/22/11 0747 12/21/11 2202 12/21/11 1804 12/21/11 1136  GLUCAP 174* 212* 168* 195* 161*    Recent Results (from the past 240 hour(s))  MRSA PCR SCREENING     Status: Normal   Collection Time   12/20/11  2:31 AM      Component Value Range Status Comment   MRSA by PCR NEGATIVE  NEGATIVE Final      Studies: Dg Chest 2 View  12/19/2011  *RADIOLOGY REPORT*  Clinical Data: Chest pain, shortness of breath, smoking history  CHEST - 2 VIEW  Comparison: Chest x-ray of 11/25/2011  Findings: Cardiomegaly is stable.  No focal infiltrate or effusion is seen.  There may be mild pulmonary vascular  congestion present, but no effusion is seen.  No acute bony abnormality is noted.  IMPRESSION: Stable moderate cardiomegaly.  Question mild pulmonary vascular congestion.  Original Report Authenticated By: Juline Patch, M.D.   Scheduled Meds:    . amLODipine  10 mg Oral Daily  . aspirin EC  81 mg Oral Daily  . cloNIDine  0.1 mg Oral BID  . enoxaparin (LOVENOX) injection  80 mg Subcutaneous Q24H  . exenatide  5 mcg Subcutaneous q morning - 10a  . furosemide  40 mg Oral BID  . gabapentin  200 mg Oral BID  . hydrALAZINE  50 mg Oral Q8H  . insulin aspart  0-15 Units Subcutaneous TID WC  . insulin glargine  40 Units Subcutaneous Q2000  . potassium chloride SA  40 mEq Oral Daily  . sodium chloride  3 mL Intravenous Q12H  . DISCONTD: furosemide  40 mg Intravenous Q8H   Continuous Infusions:   Principal Problem:  *Diastolic CHF, acute on chronic Active Problems:  Diabetes mellitus type 2 with complications, uncontrolled  Hypertension, uncontrolled  Morbid obesity with BMI of 50.0-59.9, adult  Chronic renal insufficiency, stage III (moderate)  Obstructive sleep apnea  Hypokalemia  Neuropathy of both feet  Hypertensive emergency     Brendia Sacks, MD  [facility] Team 4 Pager [phone]. If 8PM-8AM, please contact night-coverage at [email or web address], password [facility] 12/22/2011, 1:17 PM  LOS: 3 days

## 2011-12-22 NOTE — Progress Notes (Signed)
Pt had a 16 bt run of VT followed by a 6 bt run at 0053. Pt was asymptomatic and vital signs are stable other than BP being a little elevated. Benedetto Coons, NP, was notified and he ordered a Mg level to be checked in the AM. Pt is now NSR and resting, will monitor closely.

## 2011-12-22 NOTE — Discharge Summary (Signed)
Physician Discharge Summary  Debra Stokes UUV:[id number] DOB: 11/30/1975 DOA: 12/19/2011  PCP: Lonell Face, MD  Admit date: 12/19/2011 Discharge date: 12/22/2011  Recommendations for Outpatient Follow-up:  1. Follow-up HH for RN/PT/heart failure 2. Follow-up diastolic heart failure 3. Follow-up hypertension  Follow-up Information    Follow up with CORBIER,PAUL A, MD in 1 week.   Contact information:   [address] [phone]         Discharge Diagnoses:  1. Acute diastolic CHF 2. Hypertensive emergency 3. Diabetes mellitus, uncontrolled 4. CKD III 5. Chronic back pain 6. Morbid obesity 7. UDS positive for cocaine  Discharge Condition: improved Disposition: home with [facility] PT/RN/heart failure  Diet recommendation: heart healthy, diabetic  Wt Readings from Last 3 Encounters:  12/22/11 156.7 kg (345 lb 7.4 oz)  11/28/11 163.204 kg (359 lb 12.8 oz)  11/11/11 169.373 kg (373 lb 6.4 oz)   History of present illness:  35/F presented with gradually progressive shortness of breath worse on the last 3 days, associated with swelling in her feet. Upon evaluation in the emergency room, noted to be in hypertensive emergency with mild pulmonary edema.  Hospital Course:  Debra Stokes was treated for acute diastolic CHF and hypertensive emergency. She gradually improved with diuresis and blood pressure medication adjustments. Chronic co-morbidities have remained stable and patient is now stable for discharge. 1. Acute diastolic CHF: Resolved. Continue Lasix. Recent ECHO 7/15, EF 60%, grade 2 diastolic dysfunction. 2. Hypertensive emergency: Resolved. Suspected secondary to cocaine, NSAID, non-compliance. Blood pressure control improved. Continue hydralazine, diltiazem, clonidine. Avoid beta-blocker given admitted regular handling of cocaine (cannot rule out use, though patient denies).  3. Diabetes mellitus, uncontrolled by HbA1c 10.8: Stable as an inpatient. Continue Byetta,  Lantus, SSI.  4. CKD III: Stable.  5. Chronic back pain: Stable. Follow-up as an outpatient.  6. Morbid obesity  7. UDS positive for cocaine: patient denies use, but does handle regularly  Consultants:  none  Procedures:  none  Discharge Instructions  Discharge Orders    Future Orders Please Complete By Expires   Heart failure home health orders      Comments:   Heart Failure Follow-up Care:  Verify follow-up appointments per Patient Discharge Instructions. Confirm transportation arranged. Reconcile home medications with discharge medication list. Remove discontinued medications from use. Assist patient/caregiver to manage medications using pill box. Home Health Visits: Skilled nurse to admit patient within 24 hours of discharge. Skilled nurse to provide home visit within 72 hours of initial visit. Follow-up visits at least weekly for 3-5 weeks based on patient status. Assessments: Vital signs and oxygen saturation at each visit. Assess home environment for safety concerns, caregiver support and availability of low-sodium foods. Consult Child psychotherapist, PT/OT, Dietitian, and CNA based on assessments. At each visit, assess for increased dyspnea, orthopnea, chest discomfort or chest pain, presence of rales/crackles, peripheral edema, abdominal distention, and/or weight gain of 3 pounds in one day or 5 pounds in one week. Daily Weights and Symptom Monitoring: Provide scales to patient if not available. Teach patient/caregiver to weigh daily before breakfast and after voiding using same sale. Teach patient/caregiver to use HF Zone Tool/Weight Chart to track weights with symptoms and when to take action. For weight gain of 3 pounds or more in 24 hours or 5 pounds in one week, OR worsening symptoms as noted above, notify [facility] Clinic or Physician per above. Activity: Develop individualized activity plan with patient/caregiver. Patient Education: Reinforce the 5 Key Messages in the  "Living with  Heart Failure" book. Reinforce use of HF Zone Tool at each visit to support early symptom recognition and appropriate action.   Questions: Responses:   Heart Failure Follow-up Care [facility] ([facility] at [phone]   Home Health Visits    Obtain the following labs Basic Metabolic Panel   Lab frequency Weekly   Fax lab results to [facility] Clinic at [phone]   Diet Low Sodium Heart Healthy   Daily Weights and Symptom Monitoring Fax weight chart weekly X 2 to [facility] at [phone]   Fluid restrictions: 1800 mL Fluid   Diet - low sodium heart healthy      Increase activity slowly      Discharge instructions      Comments:   Avoid all non-prescribed medications, especially cocaine. Weigh daily.   Face-to-face encounter (required for Medicare/Medicaid patients)      Comments:   I GOODRICH, DANIEL certify that this patient is under my care and that I, or a nurse practitioner or physician's assistant working with me, had a face-to-face encounter that meets the physician face-to-face encounter requirements with this patient on 12/22/2011.   Questions: Responses:   The encounter with the patient was in whole, or in part, for the following medical condition, which is the primary reason for home health care acute diastolic chf   I certify that, based on my findings, the following services are medically necessary home health services Nursing    Physical therapy   My clinical findings support the need for the above services Acute exacerbation of CHF   Further, I certify that my clinical findings support that this patient is homebound due to: Leaving home exacerbates symptoms (dyspnea, pain, anxiety, etc)   To provide the following care/treatments PT    RN     Medication List  As of 12/22/2011  2:26 PM   STOP taking these medications         amLODipine 10 MG tablet      carvedilol 6.25 MG tablet      labetalol 300 MG tablet         TAKE these medications          acetaminophen 325 MG tablet   Commonly known as: TYLENOL   Take 2 tablets (650 mg total) by mouth every 6 (six) hours as needed (or Fever >/= 101).      albuterol 108 (90 BASE) MCG/ACT inhaler   Commonly known as: PROVENTIL HFA;VENTOLIN HFA   Inhale 2 puffs into the lungs every 6 (six) hours as needed. Asthma attacks      aspirin 81 MG EC tablet   Take 1 tablet (81 mg total) by mouth daily.      cloNIDine 0.1 MG tablet   Commonly known as: CATAPRES   Take 1 tablet (0.1 mg total) by mouth 2 (two) times daily.      diltiazem 120 MG 24 hr capsule   Commonly known as: CARDIZEM CD   Take 1 capsule (120 mg total) by mouth daily.      exenatide 5 MCG/0.02ML Soln   Commonly known as: BYETTA   Inject 5 mcg into the skin every morning.      furosemide 20 MG tablet   Commonly known as: LASIX   Take 1 tablet (20 mg total) by mouth daily.      hydrALAZINE 25 MG tablet   Commonly known as: APRESOLINE   Take 2 tablets (50 mg total) by mouth every 8 (eight) hours.  insulin aspart 100 UNIT/ML injection   Commonly known as: novoLOG   Inject 0-20 Units into the skin 3 (three) times daily with meals.      insulin aspart 100 UNIT/ML injection   Commonly known as: novoLOG   Inject 3-20 Units into the skin at bedtime.      insulin glargine 100 UNIT/ML injection   Commonly known as: LANTUS   Inject 40 Units into the skin daily.      LORazepam 2 MG tablet   Commonly known as: ATIVAN   Take 1 tablet (2 mg total) by mouth every 6 (six) hours as needed for anxiety. For anxiety.      potassium chloride SA 20 MEQ tablet   Commonly known as: K-DUR,KLOR-CON   Take 2 tablets (40 mEq total) by mouth daily.           The results of significant diagnostics from this hospitalization (including imaging, microbiology, ancillary and laboratory) are listed below for reference.    Significant Diagnostic Studies: Dg Chest 2 View  12/19/2011  *RADIOLOGY REPORT*  Clinical Data: Chest pain, shortness  of breath, smoking history  CHEST - 2 VIEW  Comparison: Chest x-ray of 11/25/2011  Findings: Cardiomegaly is stable.  No focal infiltrate or effusion is seen.  There may be mild pulmonary vascular congestion present, but no effusion is seen.  No acute bony abnormality is noted.  IMPRESSION: Stable moderate cardiomegaly.  Question mild pulmonary vascular congestion.  Original Report Authenticated By: Juline Patch, M.D.    Microbiology: Recent Results (from the past 240 hour(s))  MRSA PCR SCREENING     Status: Normal   Collection Time   12/20/11  2:31 AM      Component Value Range Status Comment   MRSA by PCR NEGATIVE  NEGATIVE Final      Labs: Basic Metabolic Panel:  Lab 12/22/11 1191 12/20/11 0214 12/19/11 1310  NA 134* 133* 136  K 3.6 3.9 4.4  CL 91* 94* 98  CO2 29 27 27   GLUCOSE 173* 133* 164*  BUN 27* 17 18  CREATININE 1.68* 1.45* 1.44*  CALCIUM 9.5 9.1 9.8  MG 1.9 -- --  PHOS -- -- --   Liver Function Tests:  Lab 12/19/11 1310  AST 15  ALT 13  ALKPHOS 55  BILITOT 0.5  PROT 8.2  ALBUMIN 3.9   CBC:  Lab 12/20/11 0214 12/19/11 1310  WBC 7.5 6.9  NEUTROABS -- 4.8  HGB 12.1 12.8  HCT 37.8 39.6  MCV 89.6 89.6  PLT 304 295   Cardiac Enzymes:  Lab 12/20/11 1050 12/20/11 0214 12/19/11 1951  CKTOTAL 113 115 138  CKMB 2.2 2.1 2.6  CKMBINDEX -- -- --  TROPONINI <0.30 <0.30 <0.30   BNP: BNP (last 3 results)  Basename 12/19/11 1310 11/25/11 1525 11/11/11 2106  PROBNP 2539.0* 3786.0* 4164.0*   CBG:  Lab 12/22/11 1146 12/22/11 0747 12/21/11 2202 12/21/11 1804 12/21/11 1136  GLUCAP 174* 212* 168* 195* 161*    Principal Problem:  *Diastolic CHF, acute on chronic Active Problems:  Diabetes mellitus type 2 with complications, uncontrolled  Hypertension, uncontrolled  Morbid obesity with BMI of 50.0-59.9, adult  Chronic renal insufficiency, stage III (moderate)  Obstructive sleep apnea  Hypokalemia  Neuropathy of both feet  Hypertensive emergency   Time  coordinating discharge: 35 minutes  Signed:  Brendia Sacks, MD [facility] 12/22/2011, 2:24 PM

## 2011-12-22 NOTE — Progress Notes (Signed)
Inpatient Diabetes Program Recommendations  AACE/ADA: New Consensus Statement on Inpatient Glycemic Control (2013)  Target Ranges:  Prepandial:   less than 140 mg/dL      Peak postprandial:   less than 180 mg/dL (1-2 hours)      Critically ill patients:  140 - 180 mg/dL    Results for Debra Stokes, Debra Stokes (MRN [id number]) as of 12/22/2011 13:37  Ref. Range 12/21/2011 08:17 12/21/2011 11:36 12/21/2011 18:04 12/21/2011 22:02  Glucose-Capillary Latest Range: 70-99 mg/dL 409 (H) 811 (H) 914 (H) 168 (H)    Results for Debra Stokes, Debra Stokes (MRN [id number]) as of 12/22/2011 13:37  Ref. Range 12/22/2011 07:47 12/22/2011 11:46  Glucose-Capillary Latest Range: 70-99 mg/dL 086 (H) 578 (H)    Patient having elevated fasting sugars for the last 2 days.  May need slight increase in basal insulin.  Inpatient Diabetes Program Recommendations Insulin - Basal: Please consider increasing Lantus to 45 units daily.  Note: Will follow. Ambrose Finland RN, MSN, CDE Diabetes Coordinator Inpatient Diabetes Program [phone]

## 2012-01-03 ENCOUNTER — Emergency Department (HOSPITAL_COMMUNITY)
Admission: EM | Admit: 2012-01-03 | Discharge: 2012-01-04 | Disposition: A | Discharge status: Home / Self Care | Payer: Medicare Other | Attending: Emergency Medicine | Admitting: Emergency Medicine

## 2012-01-03 ENCOUNTER — Emergency Department (HOSPITAL_COMMUNITY): Payer: Medicare Other

## 2012-01-03 ENCOUNTER — Encounter (HOSPITAL_COMMUNITY): Payer: Self-pay | Admitting: *Deleted

## 2012-01-03 DIAGNOSIS — I509 Heart failure, unspecified: Secondary | ICD-10-CM | POA: Insufficient documentation

## 2012-01-03 DIAGNOSIS — F17200 Nicotine dependence, unspecified, uncomplicated: Secondary | ICD-10-CM | POA: Insufficient documentation

## 2012-01-03 DIAGNOSIS — J45909 Unspecified asthma, uncomplicated: Secondary | ICD-10-CM | POA: Insufficient documentation

## 2012-01-03 DIAGNOSIS — R0602 Shortness of breath: Secondary | ICD-10-CM | POA: Insufficient documentation

## 2012-01-03 DIAGNOSIS — Z79899 Other long term (current) drug therapy: Secondary | ICD-10-CM | POA: Insufficient documentation

## 2012-01-03 DIAGNOSIS — IMO0001 Reserved for inherently not codable concepts without codable children: Secondary | ICD-10-CM | POA: Insufficient documentation

## 2012-01-03 DIAGNOSIS — I10 Essential (primary) hypertension: Secondary | ICD-10-CM | POA: Insufficient documentation

## 2012-01-03 DIAGNOSIS — R0789 Other chest pain: Secondary | ICD-10-CM | POA: Insufficient documentation

## 2012-01-03 DIAGNOSIS — Z794 Long term (current) use of insulin: Secondary | ICD-10-CM | POA: Insufficient documentation

## 2012-01-03 DIAGNOSIS — E119 Type 2 diabetes mellitus without complications: Secondary | ICD-10-CM | POA: Insufficient documentation

## 2012-01-03 LAB — CBC WITH DIFFERENTIAL/PLATELET
Basophils Absolute: 0 10*3/uL (ref 0.0–0.1)
Basophils Relative: 0 % (ref 0–1)
Eosinophils Absolute: 0.1 10*3/uL (ref 0.0–0.7)
Eosinophils Relative: 1 % (ref 0–5)
HCT: 42.2 % (ref 36.0–46.0)
MCHC: 33.4 g/dL (ref 30.0–36.0)
MCV: 87.6 fL (ref 78.0–100.0)
Monocytes Absolute: 0.6 10*3/uL (ref 0.1–1.0)
Neutro Abs: 3.4 10*3/uL (ref 1.7–7.7)
RDW: 15.3 % (ref 11.5–15.5)

## 2012-01-03 LAB — COMPREHENSIVE METABOLIC PANEL
AST: 12 U/L (ref 0–37)
Albumin: 3.6 g/dL (ref 3.5–5.2)
Calcium: 10 mg/dL (ref 8.4–10.5)
Creatinine, Ser: 1.35 mg/dL — ABNORMAL HIGH (ref 0.50–1.10)

## 2012-01-03 LAB — POCT I-STAT TROPONIN I: Troponin i, poc: 0.02 ng/mL (ref 0.00–0.08)

## 2012-01-03 LAB — D-DIMER, QUANTITATIVE: D-Dimer, Quant: 0.25 ug/mL-FEU (ref 0.00–0.48)

## 2012-01-03 MED ORDER — HYDROMORPHONE HCL PF 1 MG/ML IJ SOLN
1.0000 mg | Freq: Once | INTRAMUSCULAR | Status: AC
  Administered 2012-01-03: 1 mg via INTRAVENOUS
  Filled 2012-01-03: qty 1

## 2012-01-03 MED ORDER — PANTOPRAZOLE SODIUM 40 MG IV SOLR
40.0000 mg | INTRAVENOUS | Status: AC
  Administered 2012-01-03: 40 mg via INTRAVENOUS
  Filled 2012-01-03: qty 40

## 2012-01-03 MED ORDER — IOHEXOL 350 MG/ML SOLN
100.0000 mL | Freq: Once | INTRAVENOUS | Status: AC | PRN
  Administered 2012-01-03: 100 mL via INTRAVENOUS

## 2012-01-03 NOTE — ED Provider Notes (Addendum)
History     CSN: [id number]  Arrival date & time 01/03/12  1912   First MD Initiated Contact with Patient 01/03/12 1944      No chief complaint on file.   (Consider location/radiation/quality/duration/timing/severity/associated sxs/prior treatment) Patient is a 36 y.o. female presenting with chest pain. The history is provided by the patient.  Chest Pain The chest pain began 2 days ago. Chest pain occurs constantly. The chest pain is unchanged. Associated with: all activities, even at rest. At its most intense, the pain is at 9/10. The pain is currently at 9/10. The severity of the pain is severe. The quality of the pain is described as sharp, pressure-like and tightness. The pain radiates to the upper back. Chest pain is worsened by deep breathing and exertion. Primary symptoms include shortness of breath. Pertinent negatives for primary symptoms include no fever, no fatigue, no syncope, no cough, no wheezing, no palpitations, no abdominal pain, no nausea, no vomiting, no dizziness and no altered mental status. Primary symptoms comment: SOB is chronic  Associated symptoms include lower extremity edema.  Pertinent negatives for associated symptoms include no diaphoresis, no near-syncope, no numbness, no orthopnea, no paroxysmal nocturnal dyspnea and no weakness. Associated symptoms comments: chronic. She tried nothing for the symptoms. Risk factors include lack of exercise, sedentary lifestyle and obesity.  Her past medical history is significant for CHF.  Procedure history is positive for cardiac catheterization.     Past Medical History  Diagnosis Date  . Diabetes mellitus   . Asthma   . CHF (congestive heart failure)   . Chronic back pain   . Migraine headache   . Hypertension   . Morbid obesity   . Polysubstance abuse   . Sleep apnea   . Drug-seeking behavior   . Urinary tract infection   . Arthritis     Past Surgical History  Procedure Date  . Cholecystectomy   .  Cardiac catheterization     two, no blockage    Family History  Problem Relation Age of Onset  . Other Neg Hx     History  Substance Use Topics  . Smoking status: Current Everyday Smoker -- 0.5 packs/day for 16 years    Types: Cigarettes  . Smokeless tobacco: Not on file  . Alcohol Use: No     quit 5 yrs ago    OB History    Grav Para Term Preterm Abortions TAB SAB Ect Mult Living   1    1  1    0      Review of Systems  Constitutional: Negative for fever, diaphoresis, activity change, appetite change and fatigue.  HENT: Negative.   Eyes: Negative.   Respiratory: Positive for chest tightness and shortness of breath. Negative for cough, choking, wheezing and stridor.   Cardiovascular: Positive for chest pain. Negative for palpitations, orthopnea, syncope and near-syncope.  Gastrointestinal: Negative for nausea, vomiting, abdominal pain, diarrhea, constipation, blood in stool and abdominal distention.  Genitourinary: Negative.   Musculoskeletal: Positive for myalgias and back pain. Negative for joint swelling, arthralgias and gait problem.  Skin: Negative.   Neurological: Negative for dizziness, tremors, weakness, light-headedness, numbness and headaches.  Hematological: Negative.   Psychiatric/Behavioral: Negative.  Negative for altered mental status.    Allergies  Review of patient's allergies indicates no known allergies.  Home Medications   Current Outpatient Rx  Name Route Sig Dispense Refill  . ACETAMINOPHEN 325 MG PO TABS Oral Take 650 mg by mouth every 6 (six) hours  as needed. Pain/fever.    . ALBUTEROL SULFATE HFA 108 (90 BASE) MCG/ACT IN AERS Inhalation Inhale 2 puffs into the lungs every 6 (six) hours as needed. Asthma attacks.    . ASPIRIN 81 MG PO CHEW Oral Chew 81 mg by mouth daily.    Marland Kitchen CLONIDINE HCL 0.1 MG PO TABS Oral Take 0.1 mg by mouth 2 (two) times daily.    Marland Kitchen DILTIAZEM HCL ER COATED BEADS 120 MG PO CP24 Oral Take 120 mg by mouth daily.    .  FUROSEMIDE 20 MG PO TABS Oral Take 20 mg by mouth 2 (two) times daily.    Marland Kitchen HYDRALAZINE HCL 25 MG PO TABS Oral Take 50 mg by mouth every 8 (eight) hours.    . INSULIN GLARGINE 100 UNIT/ML [place] SOLN Subcutaneous Inject 40 Units into the skin daily.    Marland Kitchen LORAZEPAM 2 MG PO TABS Oral Take 2 mg by mouth every 6 (six) hours as needed. Anxiety.    Marland Kitchen EXENATIDE 5 MCG/0.02ML [place] SOLN Subcutaneous Inject 5 mcg into the skin every morning.    . FUROSEMIDE 20 MG PO TABS Oral Take 1 tablet (20 mg total) by mouth daily. 30 tablet 3  . INSULIN ASPART 100 UNIT/ML [place] SOLN Subcutaneous Inject 0-20 Units into the skin 4 (four) times daily -  with meals and at bedtime.    Marland Kitchen POTASSIUM CHLORIDE CRYS ER 20 MEQ PO TBCR Oral Take 2 tablets (40 mEq total) by mouth daily. 30 tablet 0    LMP 12/24/2011  Physical Exam  Nursing note and vitals reviewed. Constitutional: She is oriented to person, place, and time. She appears well-developed and well-nourished. No distress.       Pt appears uncomfortable.  HENT:  Head: Normocephalic and atraumatic.  Right Ear: External ear normal.  Left Ear: External ear normal.  Nose: Nose normal.  Mouth/Throat: Oropharynx is clear and moist. No oropharyngeal exudate.  Eyes: EOM are normal. Pupils are equal, round, and reactive to light. Right eye exhibits no discharge. Left eye exhibits no discharge. No scleral icterus.  Neck: Normal range of motion. Neck supple. No JVD present. No tracheal deviation present. No thyromegaly present.  Cardiovascular: Normal rate, regular rhythm, S1 normal, S2 normal, normal heart sounds and intact distal pulses.  PMI is not displaced.  Exam reveals no gallop and no friction rub.   No murmur heard. Pulmonary/Chest: Effort normal and breath sounds normal. No stridor. No respiratory distress. She has no wheezes. She has no rales. She exhibits no tenderness.  Abdominal: Soft. Bowel sounds are normal. She exhibits no distension and no mass. There is no  tenderness. There is no rebound and no guarding.       Obese, protuberant  Musculoskeletal: Normal range of motion. She exhibits edema. She exhibits no tenderness.  Lymphadenopathy:    She has no cervical adenopathy.  Neurological: She is alert and oriented to person, place, and time. No cranial nerve deficit. Coordination normal.  Skin: Skin is warm and dry. No rash noted. She is not diaphoretic. No pallor.  Psychiatric: Her speech is normal and behavior is normal. Judgment and thought content normal. Her mood appears anxious. Her affect is not angry, not blunt, not labile and not inappropriate. Cognition and memory are normal. She exhibits a depressed mood.    ED Course  Procedures (including critical care time)  Labs Reviewed  PRO B NATRIURETIC PEPTIDE - Abnormal; Notable for the following:    Pro B Natriuretic peptide (BNP) 2112.0 (*)  All other components within normal limits  GLUCOSE, CAPILLARY - Abnormal; Notable for the following:    Glucose-Capillary 189 (*)     All other components within normal limits  CBC WITH DIFFERENTIAL  D-DIMER, QUANTITATIVE  POCT I-STAT TROPONIN I  COMPREHENSIVE METABOLIC PANEL   No results found.   No diagnosis found.   Date: 01/03/2012  Rate: 87 bpm  Rhythm: sinus  QRS Axis: left  Intervals: QT prolonged  ST/T Wave abnormalities: nonspecific ST changes  Conduction Disutrbances:none  Narrative Interpretation:   Old EKG Reviewed: none available      MDM  Pt presents for evaluation of CP.  She appears uncomfortable but has stable VS. She states that she has had a negative cardiac cath within the last 1-3 years performed at [facility]  Will attempt to obtain records.  Routine CP labs obtained while pt was in triage note nl CBC, trop, and ddimer.  BNP is elevated over 2000.  Metabolic panel and CXR are still pending.  Secondary to risk factors will also obtain a CTA.  Pt describes severe pain radiating through to the back and her body  habitus may limit evaluation by CXR.  This will allow for further evaluation of a thromboembolic event, PNA, severe pulm edema, and aortic dissection with one test.  Will provide symptomatic care while awaiting test results.  0005.  Chest pain is improved.  CTA of chest demonstrates no PNA and no evidence of PE.  Visualized aorta is intact.  Obtained medical records from [facility] in [place].  In April of this year she had an extensive evaluation for chest pain that included serial cardiac markers, an echocardiogram, and a cardiac catheterization.  Although she does have known cardiomyopathy and CHF, the cardiac cath demonstrated "no evidence of coronary artery disease".  Plan at this time is to repeat the troponin, if negative will d/c home.  Pt does have elevated BP.  Will treat BP also while awaiting trop.  Pt signed over to oncoming provider.  Pt 's pain is relieved at time of sign-over and discharge has been prepared.  Anticipate normal trop.     Tobin Chad, MD 01/04/12 8119  Tobin Chad, MD 01/04/12 1512

## 2012-01-03 NOTE — ED Notes (Signed)
Per Pt:  Pt compalins of chest pain x2 days and a headache that has been radiating down her back.  Pt also complains of sciatica in R leg.  Pt st's pain in chest is associated with SOB, lightheadedness, dizziness.  Pain radiates through to the back.  Pt has hx of CHF.

## 2012-01-04 MED ORDER — HYDROMORPHONE HCL PF 1 MG/ML IJ SOLN
1.0000 mg | Freq: Once | INTRAMUSCULAR | Status: AC
  Administered 2012-01-04: 1 mg via INTRAVENOUS
  Filled 2012-01-04: qty 1

## 2012-01-04 MED ORDER — OXYCODONE-ACETAMINOPHEN 5-325 MG PO TABS: 1.0000 | ORAL_TABLET | ORAL | Status: AC | PRN

## 2012-01-04 MED ORDER — CLONIDINE HCL 0.1 MG PO TABS
0.2000 mg | ORAL_TABLET | Freq: Once | ORAL | Status: AC
  Administered 2012-01-04: 0.2 mg via ORAL
  Filled 2012-01-04: qty 2

## 2012-01-21 ENCOUNTER — Inpatient Hospital Stay (HOSPITAL_COMMUNITY): Payer: Medicare Other

## 2012-01-21 ENCOUNTER — Emergency Department (HOSPITAL_COMMUNITY): Payer: Medicare Other

## 2012-01-21 ENCOUNTER — Inpatient Hospital Stay (HOSPITAL_COMMUNITY)
Admission: EM | Admit: 2012-01-21 | Discharge: 2012-01-24 | DRG: 291 | Disposition: A | Discharge status: Home / Self Care | Payer: Medicare Other | Attending: Internal Medicine | Admitting: Internal Medicine

## 2012-01-21 ENCOUNTER — Encounter (HOSPITAL_COMMUNITY): Payer: Self-pay | Admitting: Emergency Medicine

## 2012-01-21 DIAGNOSIS — Z7982 Long term (current) use of aspirin: Secondary | ICD-10-CM

## 2012-01-21 DIAGNOSIS — F17200 Nicotine dependence, unspecified, uncomplicated: Secondary | ICD-10-CM | POA: Diagnosis present

## 2012-01-21 DIAGNOSIS — I5032 Chronic diastolic (congestive) heart failure: Secondary | ICD-10-CM | POA: Diagnosis present

## 2012-01-21 DIAGNOSIS — R079 Chest pain, unspecified: Secondary | ICD-10-CM | POA: Diagnosis present

## 2012-01-21 DIAGNOSIS — I5031 Acute diastolic (congestive) heart failure: Secondary | ICD-10-CM

## 2012-01-21 DIAGNOSIS — E119 Type 2 diabetes mellitus without complications: Secondary | ICD-10-CM | POA: Insufficient documentation

## 2012-01-21 DIAGNOSIS — I161 Hypertensive emergency: Secondary | ICD-10-CM | POA: Diagnosis present

## 2012-01-21 DIAGNOSIS — Z794 Long term (current) use of insulin: Secondary | ICD-10-CM

## 2012-01-21 DIAGNOSIS — N764 Abscess of vulva: Secondary | ICD-10-CM

## 2012-01-21 DIAGNOSIS — I509 Heart failure, unspecified: Secondary | ICD-10-CM | POA: Diagnosis present

## 2012-01-21 DIAGNOSIS — G4733 Obstructive sleep apnea (adult) (pediatric): Secondary | ICD-10-CM | POA: Diagnosis present

## 2012-01-21 DIAGNOSIS — J9601 Acute respiratory failure with hypoxia: Secondary | ICD-10-CM

## 2012-01-21 DIAGNOSIS — R221 Localized swelling, mass and lump, neck: Secondary | ICD-10-CM

## 2012-01-21 DIAGNOSIS — D649 Anemia, unspecified: Secondary | ICD-10-CM | POA: Diagnosis present

## 2012-01-21 DIAGNOSIS — F1910 Other psychoactive substance abuse, uncomplicated: Secondary | ICD-10-CM | POA: Diagnosis present

## 2012-01-21 DIAGNOSIS — I10 Essential (primary) hypertension: Secondary | ICD-10-CM

## 2012-01-21 DIAGNOSIS — I129 Hypertensive chronic kidney disease with stage 1 through stage 4 chronic kidney disease, or unspecified chronic kidney disease: Secondary | ICD-10-CM | POA: Diagnosis present

## 2012-01-21 DIAGNOSIS — R0602 Shortness of breath: Secondary | ICD-10-CM

## 2012-01-21 DIAGNOSIS — Z6841 Body Mass Index (BMI) 40.0 and over, adult: Secondary | ICD-10-CM

## 2012-01-21 DIAGNOSIS — I5033 Acute on chronic diastolic (congestive) heart failure: Principal | ICD-10-CM | POA: Diagnosis present

## 2012-01-21 DIAGNOSIS — Z720 Tobacco use: Secondary | ICD-10-CM | POA: Diagnosis present

## 2012-01-21 DIAGNOSIS — N183 Chronic kidney disease, stage 3 unspecified: Secondary | ICD-10-CM | POA: Diagnosis present

## 2012-01-21 DIAGNOSIS — J4489 Other specified chronic obstructive pulmonary disease: Secondary | ICD-10-CM | POA: Diagnosis present

## 2012-01-21 DIAGNOSIS — E876 Hypokalemia: Secondary | ICD-10-CM

## 2012-01-21 DIAGNOSIS — J9600 Acute respiratory failure, unspecified whether with hypoxia or hypercapnia: Secondary | ICD-10-CM | POA: Diagnosis present

## 2012-01-21 DIAGNOSIS — G5793 Unspecified mononeuropathy of bilateral lower limbs: Secondary | ICD-10-CM

## 2012-01-21 DIAGNOSIS — M542 Cervicalgia: Secondary | ICD-10-CM | POA: Diagnosis present

## 2012-01-21 DIAGNOSIS — R590 Localized enlarged lymph nodes: Secondary | ICD-10-CM

## 2012-01-21 DIAGNOSIS — F3289 Other specified depressive episodes: Secondary | ICD-10-CM | POA: Diagnosis present

## 2012-01-21 DIAGNOSIS — F1410 Cocaine abuse, uncomplicated: Secondary | ICD-10-CM | POA: Diagnosis present

## 2012-01-21 DIAGNOSIS — J449 Chronic obstructive pulmonary disease, unspecified: Secondary | ICD-10-CM | POA: Diagnosis present

## 2012-01-21 DIAGNOSIS — F329 Major depressive disorder, single episode, unspecified: Secondary | ICD-10-CM | POA: Diagnosis present

## 2012-01-21 DIAGNOSIS — E1165 Type 2 diabetes mellitus with hyperglycemia: Secondary | ICD-10-CM | POA: Diagnosis present

## 2012-01-21 LAB — BASIC METABOLIC PANEL
BUN: 15 mg/dL (ref 6–23)
CO2: 28 mEq/L (ref 19–32)
Calcium: 9.8 mg/dL (ref 8.4–10.5)
Creatinine, Ser: 1.38 mg/dL — ABNORMAL HIGH (ref 0.50–1.10)
GFR calc non Af Amer: 49 mL/min — ABNORMAL LOW (ref >90)
Glucose, Bld: 186 mg/dL — ABNORMAL HIGH (ref 70–99)
Sodium: 137 mEq/L (ref 135–145)

## 2012-01-21 LAB — CBC
HCT: 35.4 % — ABNORMAL LOW (ref 36.0–46.0)
Hemoglobin: 11.6 g/dL — ABNORMAL LOW (ref 12.0–15.0)
MCH: 29.4 pg (ref 26.0–34.0)
MCHC: 32.8 g/dL (ref 30.0–36.0)
MCV: 89.8 fL (ref 78.0–100.0)
RBC: 3.94 MIL/uL (ref 3.87–5.11)

## 2012-01-21 LAB — RAPID URINE DRUG SCREEN, HOSP PERFORMED
Amphetamines: NOT DETECTED
Benzodiazepines: NOT DETECTED
Tetrahydrocannabinol: POSITIVE — AB

## 2012-01-21 LAB — POCT I-STAT TROPONIN I

## 2012-01-21 LAB — D-DIMER, QUANTITATIVE: D-Dimer, Quant: 0.61 ug/mL-FEU — ABNORMAL HIGH (ref 0.00–0.48)

## 2012-01-21 LAB — PRO B NATRIURETIC PEPTIDE: Pro B Natriuretic peptide (BNP): 2518 pg/mL — ABNORMAL HIGH (ref 0–125)

## 2012-01-21 MED ORDER — ASPIRIN EC 325 MG PO TBEC
325.0000 mg | DELAYED_RELEASE_TABLET | Freq: Once | ORAL | Status: AC
  Administered 2012-01-21: 325 mg via ORAL
  Filled 2012-01-21: qty 1

## 2012-01-21 MED ORDER — MAGNESIUM HYDROXIDE 400 MG/5ML PO SUSP
30.0000 mL | Freq: Every day | ORAL | Status: DC | PRN
  Administered 2012-01-23: 30 mL via ORAL
  Filled 2012-01-21: qty 30

## 2012-01-21 MED ORDER — IOHEXOL 350 MG/ML SOLN
100.0000 mL | Freq: Once | INTRAVENOUS | Status: AC | PRN
  Administered 2012-01-21: 100 mL via INTRAVENOUS

## 2012-01-21 MED ORDER — INSULIN GLARGINE 100 UNIT/ML ~~LOC~~ SOLN
25.0000 [IU] | Freq: Every day | SUBCUTANEOUS | Status: DC
  Administered 2012-01-22 – 2012-01-23 (×3): 25 [IU] via SUBCUTANEOUS

## 2012-01-21 MED ORDER — DILTIAZEM HCL ER COATED BEADS 120 MG PO CP24
120.0000 mg | ORAL_CAPSULE | Freq: Every day | ORAL | Status: DC
  Administered 2012-01-22 – 2012-01-24 (×4): 120 mg via ORAL
  Filled 2012-01-21 (×5): qty 1

## 2012-01-21 MED ORDER — NITROGLYCERIN IN D5W 200-5 MCG/ML-% IV SOLN
2.0000 ug/min | INTRAVENOUS | Status: DC
  Administered 2012-01-21: 5 ug/min via INTRAVENOUS
  Filled 2012-01-21: qty 250

## 2012-01-21 MED ORDER — FUROSEMIDE 10 MG/ML IJ SOLN
40.0000 mg | Freq: Once | INTRAMUSCULAR | Status: AC
  Administered 2012-01-21: 40 mg via INTRAVENOUS
  Filled 2012-01-21: qty 4

## 2012-01-21 MED ORDER — HEPARIN SODIUM (PORCINE) 5000 UNIT/ML IJ SOLN
5000.0000 [IU] | Freq: Three times a day (TID) | INTRAMUSCULAR | Status: DC
  Administered 2012-01-22 – 2012-01-24 (×9): 5000 [IU] via SUBCUTANEOUS
  Filled 2012-01-21 (×11): qty 1

## 2012-01-21 MED ORDER — HYDRALAZINE HCL 50 MG PO TABS
50.0000 mg | ORAL_TABLET | Freq: Three times a day (TID) | ORAL | Status: DC
  Administered 2012-01-22 – 2012-01-24 (×9): 50 mg via ORAL
  Filled 2012-01-21 (×13): qty 1

## 2012-01-21 MED ORDER — NITROGLYCERIN 0.4 MG SL SUBL
0.4000 mg | SUBLINGUAL_TABLET | SUBLINGUAL | Status: DC | PRN
  Administered 2012-01-21: 0.4 mg via SUBLINGUAL
  Filled 2012-01-21: qty 25

## 2012-01-21 MED ORDER — SODIUM CHLORIDE 0.9 % IJ SOLN
3.0000 mL | Freq: Two times a day (BID) | INTRAMUSCULAR | Status: DC
  Administered 2012-01-22 – 2012-01-24 (×5): 3 mL via INTRAVENOUS

## 2012-01-21 MED ORDER — MORPHINE SULFATE 4 MG/ML IJ SOLN
4.0000 mg | INTRAMUSCULAR | Status: DC | PRN
  Administered 2012-01-22 (×3): 4 mg via INTRAVENOUS
  Filled 2012-01-21 (×3): qty 1

## 2012-01-21 MED ORDER — FUROSEMIDE 10 MG/ML IJ SOLN
40.0000 mg | Freq: Two times a day (BID) | INTRAMUSCULAR | Status: DC
  Administered 2012-01-21 – 2012-01-24 (×6): 40 mg via INTRAVENOUS
  Filled 2012-01-21 (×8): qty 4

## 2012-01-21 MED ORDER — ALBUTEROL (5 MG/ML) CONTINUOUS INHALATION SOLN
10.0000 mg/h | INHALATION_SOLUTION | Freq: Once | RESPIRATORY_TRACT | Status: AC
  Administered 2012-01-21: 10 mg/h via RESPIRATORY_TRACT
  Filled 2012-01-21: qty 20

## 2012-01-21 MED ORDER — ALUM & MAG HYDROXIDE-SIMETH 200-200-20 MG/5ML PO SUSP
30.0000 mL | Freq: Four times a day (QID) | ORAL | Status: DC | PRN
  Administered 2012-01-23: 30 mL via ORAL
  Filled 2012-01-21: qty 30

## 2012-01-21 MED ORDER — ASPIRIN 81 MG PO CHEW
81.0000 mg | CHEWABLE_TABLET | Freq: Every day | ORAL | Status: DC
  Administered 2012-01-22 – 2012-01-24 (×3): 81 mg via ORAL
  Filled 2012-01-21 (×2): qty 1

## 2012-01-21 MED ORDER — ACETAMINOPHEN 325 MG PO TABS
650.0000 mg | ORAL_TABLET | Freq: Four times a day (QID) | ORAL | Status: DC | PRN
  Administered 2012-01-21: 650 mg via ORAL
  Filled 2012-01-21: qty 2

## 2012-01-21 MED ORDER — HYDROMORPHONE HCL PF 1 MG/ML IJ SOLN
1.0000 mg | Freq: Once | INTRAMUSCULAR | Status: AC
  Administered 2012-01-21: 1 mg via INTRAVENOUS
  Filled 2012-01-21: qty 1

## 2012-01-21 MED ORDER — CLONIDINE HCL 0.1 MG PO TABS
0.1000 mg | ORAL_TABLET | Freq: Two times a day (BID) | ORAL | Status: DC
  Administered 2012-01-22 – 2012-01-24 (×6): 0.1 mg via ORAL
  Filled 2012-01-21 (×7): qty 1

## 2012-01-21 MED ORDER — ASPIRIN 81 MG PO CHEW: 81.0000 mg | CHEWABLE_TABLET | Freq: Every day | ORAL | Status: DC

## 2012-01-21 MED ORDER — LORAZEPAM 1 MG PO TABS: 2.0000 mg | ORAL_TABLET | Freq: Four times a day (QID) | ORAL | Status: DC | PRN

## 2012-01-21 MED ORDER — ONDANSETRON HCL 4 MG/2ML IJ SOLN: 4.0000 mg | Freq: Four times a day (QID) | INTRAMUSCULAR | Status: DC | PRN

## 2012-01-21 MED ORDER — NITROGLYCERIN 2 % TD OINT
1.0000 [in_us] | TOPICAL_OINTMENT | Freq: Once | TRANSDERMAL | Status: AC
  Administered 2012-01-21: 1 [in_us] via TOPICAL
  Filled 2012-01-21: qty 1

## 2012-01-21 MED ORDER — INSULIN ASPART 100 UNIT/ML ~~LOC~~ SOLN
0.0000 [IU] | Freq: Three times a day (TID) | SUBCUTANEOUS | Status: DC
  Administered 2012-01-22: 5 [IU] via SUBCUTANEOUS
  Administered 2012-01-22: 3 [IU] via SUBCUTANEOUS
  Administered 2012-01-23: 2 [IU] via SUBCUTANEOUS
  Administered 2012-01-23: 3 [IU] via SUBCUTANEOUS
  Administered 2012-01-23 – 2012-01-24 (×3): 2 [IU] via SUBCUTANEOUS

## 2012-01-21 MED ORDER — ONDANSETRON HCL 4 MG PO TABS: 4.0000 mg | ORAL_TABLET | Freq: Four times a day (QID) | ORAL | Status: DC | PRN

## 2012-01-21 NOTE — ED Notes (Signed)
Pt taken straight to treatment room.

## 2012-01-21 NOTE — ED Provider Notes (Signed)
History     CSN: [id number]  Arrival date & time 01/21/12  1605   First MD Initiated Contact with Patient 01/21/12 1646      Chief Complaint  Patient presents with  . Shortness of Breath  . Chest Pain    (Consider location/radiation/quality/duration/timing/severity/associated sxs/prior treatment) Patient is a 36 y.o. female presenting with shortness of breath and chest pain. The history is provided by the patient.  Shortness of Breath  Episode onset: 2-3 days ago. The onset was gradual. The problem occurs continuously. The problem has been gradually worsening. The problem is moderate. Nothing relieves the symptoms. Nothing aggravates the symptoms. Associated symptoms include chest pain and shortness of breath. Pertinent negatives include no fever and no cough. It is unknown what precipitates the cough. The cough is non-productive. There is no color change associated with the cough. Her past medical history is significant for asthma. She has been behaving normally.  Chest Pain Episode onset: 2-3 days ago. Chest pain occurs intermittently. The chest pain is worsening. At its most intense, the pain is at 8/10. The pain is currently at 8/10. The severity of the pain is moderate. The quality of the pain is described as pressure-like. The pain radiates to the upper back. Primary symptoms include shortness of breath. Pertinent negatives for primary symptoms include no fever, no fatigue, no cough, no abdominal pain, no nausea, no vomiting and no dizziness.  The patient's medical history is significant for asthma.     Past Medical History  Diagnosis Date  . Diabetes mellitus   . Asthma   . CHF (congestive heart failure)   . Chronic back pain   . Migraine headache   . Hypertension   . Morbid obesity   . Polysubstance abuse   . Sleep apnea   . Drug-seeking behavior   . Urinary tract infection   . Arthritis     Past Surgical History  Procedure Date  . Cholecystectomy   . Cardiac  catheterization     two, no blockage    Family History  Problem Relation Age of Onset  . Other Neg Hx     History  Substance Use Topics  . Smoking status: Current Everyday Smoker -- 0.5 packs/day for 16 years    Types: Cigarettes  . Smokeless tobacco: Not on file  . Alcohol Use: No     quit 5 yrs ago    OB History    Grav Para Term Preterm Abortions TAB SAB Ect Mult Living   1    1  1    0      Review of Systems  Constitutional: Negative for fever and fatigue.  HENT: Negative for congestion, drooling and neck pain.   Eyes: Negative for pain.  Respiratory: Positive for shortness of breath. Negative for cough.   Cardiovascular: Positive for chest pain.  Gastrointestinal: Negative for nausea, vomiting, abdominal pain and diarrhea.  Genitourinary: Negative for dysuria and hematuria.  Musculoskeletal: Negative for back pain and gait problem.  Skin: Negative for color change.  Neurological: Negative for dizziness and headaches.  Hematological: Negative for adenopathy.  Psychiatric/Behavioral: Negative for behavioral problems.  All other systems reviewed and are negative.    Allergies  Review of patient's allergies indicates no known allergies.  Home Medications   No current outpatient prescriptions on file.  BP 156/94  Pulse 77  Temp 98.1 F (36.7 C) (Oral)  Resp 22  Ht 5\' 5"  (1.651 m)  Wt 350 lb 15.6 oz (159.2 kg)  BMI 58.40 kg/m2  SpO2 100%  LMP 12/24/2011  Physical Exam  Nursing note and vitals reviewed. Constitutional: She is oriented to person, place, and time. She appears well-developed and well-nourished.  HENT:  Head: Normocephalic.  Mouth/Throat: Oropharynx is clear and moist. No oropharyngeal exudate.  Eyes: Conjunctivae and EOM are normal. Pupils are equal, round, and reactive to light.  Neck: Normal range of motion. Neck supple.  Cardiovascular: Normal rate, regular rhythm, normal heart sounds and intact distal pulses.  Exam reveals no gallop  and no friction rub.   No murmur heard. Pulmonary/Chest: She is in respiratory distress (tachypnea). She has no wheezes.       Dec BS in lung bases bilaterally.   Abdominal: Soft. Bowel sounds are normal. There is no tenderness. There is no rebound and no guarding.  Musculoskeletal: Normal range of motion. She exhibits edema (bilateral LE's, mild pitting). She exhibits no tenderness.  Neurological: She is alert and oriented to person, place, and time.  Skin: Skin is warm and dry.  Psychiatric: She has a normal mood and affect. Her behavior is normal.    ED Course  Procedures (including critical care time)  Labs Reviewed  CBC - Abnormal; Notable for the following:    Hemoglobin 11.6 (*)     HCT 35.4 (*)     RDW 16.4 (*)     All other components within normal limits  BASIC METABOLIC PANEL - Abnormal; Notable for the following:    Glucose, Bld 186 (*)     Creatinine, Ser 1.38 (*)     GFR calc non Af Amer 49 (*)     GFR calc Af Amer 57 (*)     All other components within normal limits  PRO B NATRIURETIC PEPTIDE - Abnormal; Notable for the following:    Pro B Natriuretic peptide (BNP) 2518.0 (*)     All other components within normal limits  D-DIMER, QUANTITATIVE - Abnormal; Notable for the following:    D-Dimer, Quant 0.61 (*)     All other components within normal limits  URINE RAPID DRUG SCREEN (HOSP PERFORMED) - Abnormal; Notable for the following:    Opiates POSITIVE (*)     Cocaine POSITIVE (*)     Tetrahydrocannabinol POSITIVE (*)     All other components within normal limits  POCT I-STAT TROPONIN I  HEMOGLOBIN A1C  TROPONIN I  TROPONIN I  COMPREHENSIVE METABOLIC PANEL  CBC   Ct Angio Chest Pe W/cm &/or Wo Cm  01/21/2012  *RADIOLOGY REPORT*  Clinical Data: Shortness of breath.  Chest pain.  Elevated blood pressure.  Elevated D-dimer.  CT ANGIOGRAPHY CHEST  Technique:  Multidetector CT imaging of the chest using the standard protocol during bolus administration of  intravenous contrast. Multiplanar reconstructed images including MIPs were obtained and reviewed to evaluate the vascular anatomy.  Contrast:  100 ml Omnipaque 300  Comparison: 01/03/2012  Findings: Technically adequate study with moderately good opacification of the central and proximal segmental pulmonary arteries.  Distal segmental and peripheral pulmonary arteries are not well demonstrated.  No filling defects demonstrated centrally suggesting no evidence of significant pulmonary embolus.  Normal caliber thoracic aorta with limited contrast bolus.  No gross dissection although visualization of the aortic lumen is limited due to the technique.  Cardiac enlargement.  Pulmonary vascular congestion with hazy opacities in the lungs suggesting edema. Visualization of the lung fields is limited due to ration artifact. Atelectasis in the lung bases.  No pleural effusions.  No pneumothorax.  Airways appear patent. Borderline prominent mediastinal lymph nodes appear unchanged since the previous study. Visualized portions of the upper abdominal organs are grossly unremarkable.  Degenerative changes in the thoracic spine.  IMPRESSION: No evidence of significant pulmonary embolus.  Cardiac enlargement with pulmonary vascular congestion and mild edema.   Original Report Authenticated By: Marlon Pel, M.D.    Dg Chest Portable 1 View  01/21/2012  *RADIOLOGY REPORT*  Clinical Data: Shortness of breath.  Chest pain.  Edema in the feet.  PORTABLE CHEST - 1 VIEW  Comparison: Chest x-ray 01/03/2012.  Findings: Lung volumes are low.  Bibasilar opacities (left greater than right) are favored to be artifactual related to underpenetration of the film, but may relate to areas of subsegmental atelectasis.  No definite consolidative airspace disease.  No pleural effusions.  Pulmonary venous congestion without frank pulmonary edema.  Cardiomegaly is again noted. The patient is rotated to the left on today's exam, resulting in  distortion of the mediastinal contours and reduced diagnostic sensitivity and specificity for mediastinal pathology.  IMPRESSION: 1.  Low lung volumes. 2.  Cardiomegaly with mild pulmonary venous congestion, but no frank pulmonary edema.   Original Report Authenticated By: Florencia Reasons, M.D.      1. Hypertensive emergency   2. SOB (shortness of breath)   3. Chest pain   4. 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  Old EKG Reviewed: unchanged    MDM  11:35 PM 36 y.o. female w hx of DM, CHF, Asthma who pw sob and waxing waning chest pressure x 2-3 days. Pt notes pain radiates to back. She has been taking double lasix, 40mg  q day for last 3 days w/ continued LE swelling. Pt tachypneic on exam O2 sat 100% on RA. Pt had similar presentation 1 mos ago, got CTA then which was neg. During last visit, medical records from [facility] in [place] were obtained. In April of this year she had an extensive evaluation for chest pain that included serial cardiac markers, an echocardiogram, and a cardiac catheterization. Although she does have known cardiomyopathy and CHF, the cardiac cath demonstrated "no evidence of coronary artery disease". Suspect CHF exac, will give alb in case there is a reactive airway component as pt has hx of asthma. Will get labs, nitro for cp/chf.   Pt remains hypertensive despite dilaudid for pain as well as po and nitro paste. Will start nitro gtt.   Pt will be admitted to hospitalist who requests d-dimer. Pt got CTA which was neg for PE.   Clinical Impression 1. Hypertensive emergency   2. SOB (shortness of breath)   3. Chest pain   4. 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

## 2012-01-21 NOTE — ED Notes (Signed)
Pt to CT for CTA chest- and will go to [id number] afterwards

## 2012-01-21 NOTE — H&P (Signed)
[facility]                                                                                    Patient Demographics  Debra Stokes, is a 36 y.o. female  CSN: [id number]  MRN: [id number]  DOB - [birth year]  Admit Date - 01/21/2012  Outpatient Primary MD for the patient is Lonell Face, MD   With History of -  Past Medical History  Diagnosis Date  . Diabetes mellitus   . Asthma   . CHF (congestive heart failure)   . Chronic back pain   . Migraine headache   . Hypertension   . Morbid obesity   . Polysubstance abuse   . Sleep apnea   . Drug-seeking behavior   . Urinary tract infection   . Arthritis       Past Surgical History  Procedure Date  . Cholecystectomy   . Cardiac catheterization     two, no blockage    in for   Chief Complaint  Patient presents with  . Shortness of Breath  . Chest Pain     HPI  Debra Stokes  is a 36 y.o. female, with past medical history of gastric heart failure, polysubstance abuse, morbid obesity, drug-seeking behavior, hypertension, with recent presentation to [facility] in August of this year with complaints of chest pain and shortness of breath and worsening lower extremity edema,, where her CTA chest was negative for PE, her cardiac enzymes were negative, patient had recent cardiac cath in April 2013 at [facility] in [place], with no evidence of coronary artery disease, patient presents today with similar complaints, reports she has been complaining of chest pain, shortness of breath, worsening lower extremity edema, for the last 4 days, where she reports she increased her Lasix dose at home from 20 mg daily to 40 mg daily without improvement of her symptoms, upon presentation patient was that hypertensive, with systolic blood pressure in the 200s, required to be started on nitroglycerin drip, patient is not to have history of polysubstance abuse Monday for cocaine, she denies any recent use of cocaine, but her Urine  toxicology screen came back positive for cocaine and tetrahydrocannabinol.    Review of Systems    In addition to the HPI above, No Fever-chills, No Headache, No changes with Vision or hearing, No problems swallowing food or Liquids, Complaints of chest pain, shortness of breath, and worsening lower extremity edema No Abdominal pain, No Nausea or Vommitting, Bowel movements are regular, No Blood in stool or Urine, No dysuria, No new skin rashes or bruises, No new joints pains-aches,  No new weakness, tingling, numbness in any extremity, No recent weight gain or loss, No polyuria, polydypsia or polyphagia, No significant Mental Stressors.  A full 10 point Review of Systems was done, except as stated above, all other Review of Systems were negative.   Social History History  Substance Use Topics  . Smoking status: Current Everyday Smoker -- 0.5 packs/day for 16 years    Types: Cigarettes  . Smokeless tobacco: Not on file  . Alcohol Use: No     quit 5 yrs ago  History of  cocaine abuse, denies any recent use, but her drug screen is positive for cocaine  Family History Family History  Problem Relation Age of Onset  . Other Neg Hx      Prior to Admission medications   Medication Sig Start Date End Date Taking? Authorizing Provider  acetaminophen (TYLENOL) 325 MG tablet Take 650 mg by mouth every 6 (six) hours as needed. Pain/fever.   Yes Historical Provider, MD  albuterol (PROVENTIL HFA;VENTOLIN HFA) 108 (90 BASE) MCG/ACT inhaler Inhale 2 puffs into the lungs every 6 (six) hours as needed. For asthma attacks.   Yes Historical Provider, MD  aspirin 81 MG chewable tablet Chew 81 mg by mouth daily.   Yes Historical Provider, MD  cloNIDine (CATAPRES) 0.1 MG tablet Take 0.1 mg by mouth 2 (two) times daily.   Yes Historical Provider, MD  diltiazem (CARDIZEM CD) 120 MG 24 hr capsule Take 120 mg by mouth daily.   Yes Historical Provider, MD  exenatide (BYETTA) 5 MCG/0.02ML SOLN  Inject 5 mcg into the skin every morning. 11/04/11  Yes Peggy Constant, MD  furosemide (LASIX) 20 MG tablet Take 20 mg by mouth daily.    Yes Historical Provider, MD  hydrALAZINE (APRESOLINE) 25 MG tablet Take 50 mg by mouth every 8 (eight) hours.   Yes Historical Provider, MD  insulin aspart (NOVOLOG) 100 UNIT/ML injection Inject 0-20 Units into the skin 4 (four) times daily -  with meals and at bedtime. Sliding scale. 11/04/11 11/03/12 Yes Peggy Constant, MD  insulin glargine (LANTUS) 100 UNIT/ML injection Inject 40 Units into the skin at bedtime.    Yes Historical Provider, MD  LORazepam (ATIVAN) 2 MG tablet Take 2 mg by mouth every 6 (six) hours as needed. For anxiety.   Yes Historical Provider, MD  potassium chloride SA (K-DUR,KLOR-CON) 20 MEQ tablet Take 40 mEq by mouth daily.   Yes Historical Provider, MD    No Known Allergies  Physical Exam  Vitals  Blood pressure 148/94, pulse 76, temperature 97.9 F (36.6 C), temperature source Oral, resp. rate 23, last menstrual period 12/24/2011, SpO2 100.00%.   1. General morbidly obese female lying in bed in NAD,    2. Normal affect and insight, Not Suicidal or Homicidal, Awake Alert, Oriented X 3.  3. No F.N deficits, ALL C.Nerves Intact, Strength 5/5 all 4 extremities, Sensation intact all 4 extremities, Plantars down going.  4. Ears and Eyes appear Normal, Conjunctivae clear, PERRLA. Moist Oral Mucosa.  5. Supple Neck, No JVD, No cervical lymphadenopathy appriciated, No Carotid Bruits.  6. Symmetrical Chest wall movement, Good air movement bilaterally, CTAB.  7. RRR, No Gallops, Rubs or Murmurs, No Parasternal Heave.  8. Positive Bowel Sounds, morbidly obese,Abdomen Soft, Non tender, No organomegaly appriciated,No rebound -guarding or rigidity.  9.  No Cyanosis, Normal Skin Turgor, No Skin Rash or Bruise.  10. Good muscle tone,  joints appear normal , no effusions, Normal ROM. No lower extremity edema.  11. No Palpable Lymph Nodes  in Neck or Axillae    Data Review  CBC  Lab 01/21/12 1650  WBC 6.6  HGB 11.6*  HCT 35.4*  PLT 229  MCV 89.8  MCH 29.4  MCHC 32.8  RDW 16.4*  LYMPHSABS --  MONOABS --  EOSABS --  BASOSABS --  BANDABS --   ------------------------------------------------------------------------------------------------------------------  Chemistries   Lab 01/21/12 1650  NA 137  K 3.7  CL 99  CO2 28  GLUCOSE 186*  BUN 15  CREATININE 1.38*  CALCIUM 9.8  MG --  AST --  ALT --  ALKPHOS --  BILITOT --   ------------------------------------------------------------------------------------------------------------------ CrCl is unknown because both a height and weight (above a minimum accepted value) are required for this calculation. ------------------------------------------------------------------------------------------------------------------ No results found for this basename: TSH,T4TOTAL,FREET3,T3FREE,THYROIDAB in the last 72 hours   Coagulation profile No results found for this basename: INR:5,PROTIME:5 in the last 168 hours -------------------------------------------------------------------------------------------------------------------  [facility] 01/21/12 2135  DDIMER 0.61*   -------------------------------------------------------------------------------------------------------------------  Cardiac Enzymes No results found for this basename: CK:3,CKMB:3,TROPONINI:3,MYOGLOBIN:3 in the last 168 hours ------------------------------------------------------------------------------------------------------------------ No components found with this basename: POCBNP:3   ---------------------------------------------------------------------------------------------------------------  Urinalysis    Component Value Date/Time   COLORURINE YELLOW 12/19/2011 1256   APPEARANCEUR CLEAR 12/19/2011 1256   LABSPEC 1.017 12/19/2011 1256   PHURINE 5.5 12/19/2011 1256   GLUCOSEU NEGATIVE 12/19/2011  1256   HGBUR NEGATIVE 12/19/2011 1256   BILIRUBINUR NEGATIVE 12/19/2011 1256   KETONESUR NEGATIVE 12/19/2011 1256   PROTEINUR 30* 12/19/2011 1256   UROBILINOGEN 0.2 12/19/2011 1256   NITRITE NEGATIVE 12/19/2011 1256   LEUKOCYTESUR NEGATIVE 12/19/2011 1256    ----------------------------------------------------------------------------------------------------------------    Imaging results:   Ct Angio Chest Pe W/cm &/or Wo Cm  01/03/2012  *RADIOLOGY REPORT*  Clinical Data: Chest pain short of breath  CT ANGIOGRAPHY CHEST  Technique:  Multidetector CT imaging of the chest using the standard protocol during bolus administration of intravenous contrast. Multiplanar reconstructed images including MIPs were obtained and reviewed to evaluate the vascular anatomy.  Contrast: OMNIPAQUE IOHEXOL 350 MG/ML SOLN  Comparison: CT 10/08/2011  Findings: Negative for pulmonary embolism.  Negative for aortic aneurysm.  Marked cardiac enlargement with four-chamber dilatation.  Pulmonary artery enlargement suggesting pulmonary hypertension.  Pulmonary veins are distended and there is mild edema in the lungs.  No pleural effusion.  Negative for pneumonia.  No mass or adenopathy.  IMPRESSION: Negative for pulmonary embolism.  Marked cardiac enlargement with pulmonary artery hypertension and pulmonary venous hypertension.  Mild pulmonary edema is present.   Original Report Authenticated By: Camelia Phenes, M.D.    Dg Chest Portable 1 View  01/21/2012  *RADIOLOGY REPORT*  Clinical Data: Shortness of breath.  Chest pain.  Edema in the feet.  PORTABLE CHEST - 1 VIEW  Comparison: Chest x-ray 01/03/2012.  Findings: Lung volumes are low.  Bibasilar opacities (left greater than right) are favored to be artifactual related to underpenetration of the film, but may relate to areas of subsegmental atelectasis.  No definite consolidative airspace disease.  No pleural effusions.  Pulmonary venous congestion without frank pulmonary edema.   Cardiomegaly is again noted. The patient is rotated to the left on today's exam, resulting in distortion of the mediastinal contours and reduced diagnostic sensitivity and specificity for mediastinal pathology.  IMPRESSION: 1.  Low lung volumes. 2.  Cardiomegaly with mild pulmonary venous congestion, but no frank pulmonary edema.   Original Report Authenticated By: Florencia Reasons, M.D.    Dg Chest Port 1 View  01/03/2012  *RADIOLOGY REPORT*  Clinical Data: Chest pain  PORTABLE CHEST - 1 VIEW  Comparison: 12/19/2011  Findings: Cardiac enlargement with vascular congestion.  Mild interstitial edema is present.  No pleural effusion.  Left lower lobe atelectasis.  IMPRESSION: Congestive heart failure with mild interstitial edema.   Original Report Authenticated By: Camelia Phenes, M.D.     My personal review of EKG: Rhythm NSR, no significant ST or T wave changes.  *  Assessment & Plan  Active Problems:  Chest pain  Diastolic CHF,  acute on chronic  Hypertensive emergency  SOB (shortness of breath)  Tobacco use  DM (diabetes mellitus)    1. chest pain, patient had multiple admission with similar presentation, had recent cardiac cath in April of this year with no evidence of carotid disease, has no EKG changes, negative troponin, presents with hypertension, so unclear if this is related to hypertensive emergency, or if this related to her cocaine use,patient was given aspirin, we'll cycle cardiac enzymes, will continue on nitroglycerin drip. Patient had elevated D. dimers, even though she had recent CT chest angiogram negative for PE in August of this year, will repeat CT to rule out PE.  2. Diastolic CHF, acute on chronic, will start patient on IV Lasix  3. Hypertensive emergency, patient reports she is compliant with her medication, this might be related to her cocaine use, will continue with nitroglycerin drip, and we'll resume her home medication.  4. Tobacco use, patient was counseled,  will be started on Nicoderm patch  5. Diabetes mellitus, will continue on Lantus and insulin sliding scale  DVT Prophylaxis Heparin  AM Labs Ordered, also please review Full Orders  Family Communication: Admission, patients condition and plan of care including tests being ordered have been discussed with the patientwho indicate understanding and agree with the plan and Code Status.  Code Status  Full code Disposition Plan: home Time spent in minutes : Condition GUARDED  Randol Kern, Ajene Carchi M.D on 01/21/2012 at 10:41 PM    [facility] Office  [phone]

## 2012-01-21 NOTE — ED Notes (Signed)
C/o sob, generalized chest pain, swelling to bilateral lower extremities, and back pain since Thursday.  Has been taking double the amount of Lasix since Thursday without relief of symptoms.

## 2012-01-22 DIAGNOSIS — J9601 Acute respiratory failure with hypoxia: Secondary | ICD-10-CM | POA: Diagnosis present

## 2012-01-22 DIAGNOSIS — F1910 Other psychoactive substance abuse, uncomplicated: Secondary | ICD-10-CM

## 2012-01-22 DIAGNOSIS — I5032 Chronic diastolic (congestive) heart failure: Secondary | ICD-10-CM | POA: Diagnosis present

## 2012-01-22 LAB — GLUCOSE, CAPILLARY
Glucose-Capillary: 179 mg/dL — ABNORMAL HIGH (ref 70–99)
Glucose-Capillary: 200 mg/dL — ABNORMAL HIGH (ref 70–99)
Glucose-Capillary: 164 mg/dL — ABNORMAL HIGH (ref 70–99)
Glucose-Capillary: 152 mg/dL — ABNORMAL HIGH (ref 70–99)
Glucose-Capillary: 203 mg/dL — ABNORMAL HIGH (ref 70–99)

## 2012-01-22 LAB — CBC
HCT: 33 % — ABNORMAL LOW (ref 36.0–46.0)
Hemoglobin: 10.7 g/dL — ABNORMAL LOW (ref 12.0–15.0)
MCH: 29.2 pg (ref 26.0–34.0)
MCV: 89.9 fL (ref 78.0–100.0)
Platelets: 191 10*3/uL (ref 150–400)
RBC: 3.67 MIL/uL — ABNORMAL LOW (ref 3.87–5.11)
WBC: 6.7 10*3/uL (ref 4.0–10.5)

## 2012-01-22 LAB — COMPREHENSIVE METABOLIC PANEL
AST: 10 U/L (ref 0–37)
Albumin: 3.3 g/dL — ABNORMAL LOW (ref 3.5–5.2)
BUN: 15 mg/dL (ref 6–23)
Calcium: 9 mg/dL (ref 8.4–10.5)
Chloride: 98 mEq/L (ref 96–112)
Creatinine, Ser: 1.5 mg/dL — ABNORMAL HIGH (ref 0.50–1.10)
Total Bilirubin: 0.6 mg/dL (ref 0.3–1.2)
Total Protein: 6.9 g/dL (ref 6.0–8.3)

## 2012-01-22 LAB — MRSA PCR SCREENING: MRSA by PCR: POSITIVE — AB

## 2012-01-22 MED ORDER — CHLORHEXIDINE GLUCONATE CLOTH 2 % EX PADS
6.0000 | MEDICATED_PAD | Freq: Every day | CUTANEOUS | Status: DC
  Administered 2012-01-22 – 2012-01-24 (×3): 6 via TOPICAL

## 2012-01-22 MED ORDER — HYDROCODONE-ACETAMINOPHEN 5-325 MG PO TABS
1.0000 | ORAL_TABLET | Freq: Four times a day (QID) | ORAL | Status: DC | PRN
  Administered 2012-01-22 – 2012-01-23 (×2): 2 via ORAL
  Filled 2012-01-22 (×2): qty 2

## 2012-01-22 MED ORDER — LORAZEPAM 1 MG PO TABS
1.0000 mg | ORAL_TABLET | Freq: Four times a day (QID) | ORAL | Status: DC | PRN
  Administered 2012-01-22 – 2012-01-23 (×4): 1 mg via ORAL
  Filled 2012-01-22 (×4): qty 1

## 2012-01-22 MED ORDER — MUPIROCIN 2 % EX OINT
1.0000 "application " | TOPICAL_OINTMENT | Freq: Two times a day (BID) | CUTANEOUS | Status: DC
  Administered 2012-01-22 – 2012-01-24 (×5): 1 via NASAL
  Filled 2012-01-22 (×2): qty 22

## 2012-01-22 MED ORDER — HYDROCODONE-ACETAMINOPHEN 5-325 MG PO TABS
1.0000 | ORAL_TABLET | Freq: Four times a day (QID) | ORAL | Status: DC | PRN
  Administered 2012-01-22: 2 via ORAL
  Filled 2012-01-22: qty 2

## 2012-01-22 MED ORDER — POTASSIUM CHLORIDE CRYS ER 20 MEQ PO TBCR
20.0000 meq | EXTENDED_RELEASE_TABLET | Freq: Two times a day (BID) | ORAL | Status: DC
  Administered 2012-01-22 – 2012-01-24 (×5): 20 meq via ORAL
  Filled 2012-01-22 (×6): qty 1

## 2012-01-22 MED ORDER — MORPHINE SULFATE 2 MG/ML IJ SOLN
2.0000 mg | INTRAMUSCULAR | Status: DC | PRN
  Administered 2012-01-22 – 2012-01-23 (×4): 2 mg via INTRAVENOUS
  Filled 2012-01-22 (×4): qty 1

## 2012-01-22 NOTE — ED Provider Notes (Signed)
I saw and evaluated the patient, reviewed the resident's note and I agree with the findings and plan.  Brittania Sudbeck, MD 01/22/12 0750 

## 2012-01-22 NOTE — Progress Notes (Signed)
Patient being transferred to unit 2000 per MD order, report called to [facility], RN on unit 2000. Patient will transfer in wheel chair.

## 2012-01-22 NOTE — Progress Notes (Signed)
[facility] Progress Note [name] TEAM 1 - Stepdown/ICU TEAM   Debra Stokes QMV:[id number] DOB: [birth year] DOA: 01/21/2012 PCP: Lonell Face, MD  Brief narrative: 36 year old female patient with multiple medical problems including diastolic heart failure and morbid obesity with hypertension. Previous evaluation in August 2013 for complaints of chest pain or shortness of breath associated with worsening lower extremity edema. At that time CT of the chest was negative for PE and cardiac enzymes were negative. Patient had negative cardiac catheterization in April 2013 at [facility] in [place]. She presents this time with complaints of increasing lower extremity edema. She made an attempt to increase her baseline Lasix from 20 to 40 mg daily without improvement in her symptoms. At time of presentation to the emergency department her blood pressure was in the 200s and therefore she was started on a nitroglycerin drip. Urine drug screen was also obtained because of the patient's prior history of polysubstance abuse and this did demonstrate a positive finding of cocaine and tetrahydrocannabinol although the patient denied any recent use of cocaine. She was admitted to the step down unit for further evaluation and treatment  Assessment/Plan:  Diastolic CHF, acute on chronic diastolic heart failure, NYHA class 2 vs/ cocaine inhalation induced pulmonary edema ("crack lung") *Has diuresed nearly 3500 cc of fluid since admit *Continue Lasix 40 mg IV every 12 hours *Begin supplemental potassium to avoid Lasix-induced hypokalemia *Last echocardiogram was completed July 2013 therefore no indication to repeat echocardiogram this admission  Acute respiratory failure with hypoxia *Resolved - currently on room air  CKD (chronic kidney disease) stage 3, GFR 30-59 ml/min *Creatinine has increased since admission and suspect secondary to CT angiogram of the chest completed at time of  admission *Continue to monitor her electrolyte panel and renal function  Diabetes mellitus type 2 with complications, uncontrolled *CBGs better controlled since admission *Continue Lantus and sliding scale insulin *Hemoglobin A1c this admission is pending  COPD (chronic obstructive pulmonary disease) *Currently compensated  Chest pain *Resolved and suspect related to uncontrolled blood pressure +/- cocaine induced vasospasm *Cardiac enzymes and EKG nonischemic and patient recently had apparent normal cardiac catheterization April of this year  Hypertensive emergency - likely cocaine induced *Have discontinued IV nitroglycerin in favor of patient usual antihypertensive medications-clonidine, hydralazine and diltiazem  Morbid obesity with BMI of 50.0-59.9, adult  Polysubstance abuse/Tobacco use *Urine drug screen positive for cocaine and tetrahydrocannabinol *Recommend cessation counseling once again but unable to proceed today because patient did not excuse family member from room - will consult SW to counsel  Obstructive sleep apnea  DVT prophylaxis: Subcutaneous heparin every 8 hours Code Status: Full Family Communication: Spoke directly with patient, family member in room so did not breech sensitive subjects Disposition Plan: Transfer to telemetry  Consultants: None  Procedures: None  Antibiotics: None  HPI/Subjective: Patient awake and appears to be in pain. She is complaining of severe headache but denies chest pain or shortness of breath.   Objective: Blood pressure 136/94, pulse 78, temperature 97.8 F (36.6 C), temperature source Oral, resp. rate 18, height 5\' 5"  (1.651 m), weight 159.2 kg (350 lb 15.6 oz), last menstrual period 12/24/2011, SpO2 100.00%.  Intake/Output Summary (Last 24 hours) at 01/22/12 1225 Last data filed at 01/22/12 1123  Gross per 24 hour  Intake   1843 ml  Output   5450 ml  Net  -3607 ml     Exam: General: No acute respiratory  distress at rest Lungs: Distant breath sounds due  to body habitus - Clear to auscultation bilaterally without wheezes or crackles, room air Cardiovascular: Distant heart sounds due to body habitus - Regular rate and rhythm without murmur gallop or rub normal S1 and S2, IV nitroglycerin at low rate, 1+ bilateral lower extremity edema Abdomen: Nontender, nondistended, soft, bowel sounds positive, no rebound, no ascites, no appreciable mass Musculoskeletal: No significant cyanosis, clubbing of extremities Neurological: Patient awake and oriented x3, moves all extremities x4, exam non-focal  Data Reviewed: Basic Metabolic Panel:  Lab 01/22/12 4540 01/21/12 1650  NA 136 137  K 3.4* 3.7  CL 98 99  CO2 29 28  GLUCOSE 195* 186*  BUN 15 15  CREATININE 1.50* 1.38*  CALCIUM 9.0 9.8  MG -- --  PHOS -- --   Liver Function Tests:  Lab 01/22/12 0554  AST 10  ALT 11  ALKPHOS 52  BILITOT 0.6  PROT 6.9  ALBUMIN 3.3*   CBC:  Lab 01/22/12 0554 01/21/12 1650  WBC 6.7 6.6  NEUTROABS -- --  HGB 10.7* 11.6*  HCT 33.0* 35.4*  MCV 89.9 89.8  PLT 191 229   Cardiac Enzymes:  Lab 01/22/12 0553 01/21/12 2327  CKTOTAL -- --  CKMB -- --  CKMBINDEX -- --  TROPONINI <0.30 <0.30   BNP (last 3 results)  Basename 01/21/12 1740 01/03/12 2020 12/19/11 1310  PROBNP 2518.0* 2112.0* 2539.0*   CBG:  Lab 01/22/12 1119 01/22/12 0734 01/22/12 0331 01/21/12 2253  GLUCAP 152* 164* 200* 179*    Recent Results (from the past 240 hour(s))  MRSA PCR SCREENING     Status: Abnormal   Collection Time   01/21/12 11:42 PM      Component Value Range Status Comment   MRSA by PCR POSITIVE (*) NEGATIVE Final      Studies:  Recent x-ray studies have been reviewed in detail by the Attending Physician  Scheduled Meds:  Reviewed in detail by the Attending Physician   Junious Silk, ANP [facility] Office  [phone] Pager [phone]  On-Call/Text Page:      [email or web address]      password  [email or web address]  If 7PM-7AM, please contact night-coverage [email or web address] Password [email or web address] 01/22/2012, 12:25 PM   LOS: 1 day   I have personally examined this patient and reviewed the entire database. I have reviewed the above note, made any necessary editorial changes, and agree with its content.  Lonia Blood, MD [facility]

## 2012-01-22 NOTE — Progress Notes (Signed)
01/22/2012 7:25 PM Nursing note Paged md regarding pt. Reports of intermittent sharp pain in chest, closely associated per patient with "getting worked up" with anxiety and "blood pressure spikes".  Pt displaying moderate anxiety over hospital stay and dietary restrictions. EKG have been performed during episode no acute changes. During Episode BP 184/115 and after pain relieved BP 155/99. All cardiac enzymes have been negative. Pain relieved with relaxation and prn pain medication. Pt refusing PRN ativan that is ordered for anxiety. Dr. Bretta Bang paged and made aware of events of the day. No new orders received at this time. Oncoming RN notified of MD update as well encouraged to page MD if patient status changes.

## 2012-01-22 NOTE — Progress Notes (Signed)
Utilization review completed.  

## 2012-01-23 DIAGNOSIS — J9600 Acute respiratory failure, unspecified whether with hypoxia or hypercapnia: Secondary | ICD-10-CM

## 2012-01-23 DIAGNOSIS — I5033 Acute on chronic diastolic (congestive) heart failure: Principal | ICD-10-CM

## 2012-01-23 LAB — BASIC METABOLIC PANEL
Calcium: 9.1 mg/dL (ref 8.4–10.5)
GFR calc Af Amer: 50 mL/min — ABNORMAL LOW (ref >90)
GFR calc non Af Amer: 43 mL/min — ABNORMAL LOW (ref >90)
Potassium: 4 mEq/L (ref 3.5–5.1)
Sodium: 138 mEq/L (ref 135–145)

## 2012-01-23 LAB — GLUCOSE, CAPILLARY: Glucose-Capillary: 157 mg/dL — ABNORMAL HIGH (ref 70–99)

## 2012-01-23 LAB — CBC
MCH: 29 pg (ref 26.0–34.0)
MCHC: 32.2 g/dL (ref 30.0–36.0)
RDW: 16.3 % — ABNORMAL HIGH (ref 11.5–15.5)

## 2012-01-23 MED ORDER — MORPHINE SULFATE 2 MG/ML IJ SOLN
1.0000 mg | INTRAMUSCULAR | Status: DC | PRN
  Administered 2012-01-23 – 2012-01-24 (×6): 1 mg via INTRAVENOUS
  Filled 2012-01-23 (×6): qty 1

## 2012-01-23 MED ORDER — HYDROCODONE-ACETAMINOPHEN 5-325 MG PO TABS
1.0000 | ORAL_TABLET | Freq: Four times a day (QID) | ORAL | Status: DC | PRN
  Administered 2012-01-23 – 2012-01-24 (×2): 1 via ORAL
  Filled 2012-01-23 (×2): qty 1

## 2012-01-23 MED ORDER — IBUPROFEN 400 MG PO TABS
400.0000 mg | ORAL_TABLET | Freq: Four times a day (QID) | ORAL | Status: DC | PRN
  Filled 2012-01-23 (×2): qty 1

## 2012-01-23 NOTE — Clinical Social Work Psychosocial (Signed)
Clinical Social Work Department CLINICAL SOCIAL WORK PLACEMENT NOTE 01/23/2012  Patient:  Debra Stokes,Debra Stokes  Account Number:  [id number] Admit date:  11/20/2011  Clinical Social Worker:  Keldric Poyer, LCSWA  Date/time:  01/23/2012 04:56 PM  Clinical Social Work is seeking post-discharge placement for this patient at the following level of care:   SKILLED NURSING   (*CSW will update this form in Epic as items are completed)   01/22/2012  Patient/family provided with [facility] Department of Clinical Social Work's list of facilities offering this level of care within the geographic area requested by the patient (or if unable, by the patient's family).  01/22/2012  Patient/family informed of their freedom to choose among providers that offer the needed level of care, that participate in Medicare, Medicaid or managed care program needed by the patient, have an available bed and are willing to accept the patient.  01/22/2012  Patient/family informed of [facility]' ownership interest in [facility], as well as of the fact that they are under no obligation to receive care at this facility.  PASARR submitted to EDS on 01/22/2012 PASARR number received from EDS on 01/22/2012  FL2 transmitted to all facilities in geographic area requested by pt/family on  01/22/2012 FL2 transmitted to all facilities within larger geographic area on   Patient informed that his/her managed care company has contracts with or will negotiate with  certain facilities, including the following:     Patient/family informed of bed offers received:  01/23/2012 Patient chooses bed at [facility] Physician recommends and patient chooses bed at    Patient to be transferred to [facility] on  01/23/2012 Patient to be transferred to facility by [facility]  The following physician request were entered in Epic:   Additional Comments: none  Gina Kinslie Hove, LCSWA [phone]  Clinical Social Work  

## 2012-01-23 NOTE — Progress Notes (Signed)
Pt ambulated in room today on Room Air to bathroom and was SOB.  Oxygen sats were 88 % on Room air at that time. Pt placed on 2 Liters of oxygen and sats came up to 100%. Will continue to monitor.

## 2012-01-23 NOTE — Progress Notes (Signed)
[facility] Progress Note [place] TEAM 1 - Stepdown/ICU TEAM   Debra Stokes VWU:[id number] DOB: 29-Mar-1976 DOA: 01/21/2012 PCP: Lonia Blood, MD  Brief narrative: 36 year old female patient with multiple medical problems including diastolic heart failure and morbid obesity with hypertension. Previous evaluation in August 2013 for complaints of chest pain or shortness of breath associated with worsening lower extremity edema. At that time CT of the chest was negative for PE and cardiac enzymes were negative. Patient had negative cardiac catheterization in April 2013 at [facility] in [place]. She presents this time with complaints of increasing lower extremity edema. She made an attempt to increase her baseline Lasix from 20 to 40 mg daily without improvement in her symptoms. At time of presentation to the emergency department her blood pressure was in the 200s and therefore she was started on a nitroglycerin drip. Urine drug screen was also obtained because of the patient's prior history of polysubstance abuse and this did demonstrate a positive finding of cocaine and tetrahydrocannabinol although the patient denied any recent use of cocaine. She was admitted to the step down unit for further evaluation and treatment  Assessment/Plan:  Diastolic CHF, acute on chronic diastolic heart failure, NYHA class 2 vs/ cocaine inhalation induced pulmonary edema * - 11,047 ml since admission *Continue Lasix 40 mg IV every 12 hours for additional 24 hours then consider switching to Po and D/C home. *Begin supplemental potassium to avoid Lasix-induced hypokalemia *Last echocardiogram was completed July 2013 therefore no indication to repeat echocardiogram this admission  Acute respiratory failure with hypoxia *Resolved - currently on room air * Orthopnea atleast for a year. Still c/o DOE. Dyspnea prob multifactorial: decompensated CHF, COPD, Morbid obesity. * Ambulate and assess O2  sats today.  CKD (chronic kidney disease) stage 3, GFR 30-59 ml/min *Creatinine has increased since admission and suspect secondary to CT angiogram of the chest completed at time of admission *Continue to monitor her electrolyte panel and renal function * Creatinine slightly higher versus baseline. Stable.  Diabetes mellitus type 2 with complications, uncontrolled *CBGs better controlled since admission *Continue Lantus and sliding scale insulin *Hemoglobin A1c: 8.3  COPD (chronic obstructive pulmonary disease) *Currently compensated  Chest pain *Resolved and suspect related to uncontrolled blood pressure +/- cocaine induced vasospasm *Cardiac enzymes and EKG nonischemic and patient recently had apparent normal cardiac catheterization April of this year * denied CP today  Hypertensive emergency - likely cocaine induced *Have discontinued IV nitroglycerin in favor of patient usual antihypertensive medications-clonidine, hydralazine and diltiazem * Better, but periodic fluctuations: should improve once Cocaine washed out  Morbid obesity with BMI of 50.0-59.9, adult  Polysubstance abuse/Tobacco use *Urine drug screen positive for cocaine and tetrahydrocannabinol *Recommend cessation counseling once again but unable to proceed today because patient did not excuse family member from room - will consult SW to counsel  Obstructive sleep apnea Has not done sleep study: advised to get done as OP through PCP.  Neck Pain/sprain Prn pain meds. Heat pad.  Anemia Possibly chronic. Stable.  DVT prophylaxis: Subcutaneous heparin every 8 hours Code Status: Full Family Communication: Discussed directly with patient with her nurse in room. Disposition Plan: D/C ? 10/11.  Consultants: None  Procedures: None  Antibiotics: None  HPI/Subjective: C/O right sided neck pain. Does not know if she slept in wrong position. Denies CP. Has DOE. Complains of foleys  discomfort.   Objective: Blood pressure 149/92, pulse 70, temperature 98 F (36.7 C), temperature source Oral, resp. rate 19, height 5'  5" (1.651 m), weight 159.2 kg (350 lb 15.6 oz), last menstrual period 12/24/2011, SpO2 100.00%.  Intake/Output Summary (Last 24 hours) at 01/23/12 1001 Last data filed at 01/23/12 0600  Gross per 24 hour  Intake   1200 ml  Output   9700 ml  Net  -8500 ml     Exam: General: No acute respiratory distress at rest. Morbidly Obese Lungs: Distant breath sounds due to body habitus - reduced BS's in bases. Rest of lung fields CTA. No increased WOB. Cardiovascular: Distant heart sounds due to body habitus - Regular rate and rhythm without murmur gallop or rub normal S1 and S2,1+ bilateral lower extremity edema. Telemetry showns SR. Abdomen: Nontender, nondistended, soft, bowel sounds positive, no rebound, no ascites, no appreciable mass Musculoskeletal: No significant cyanosis, clubbing of extremities Neurological: Patient awake and oriented x3, moves all extremities x4, exam non-focal Neck: mild tenderness right posterior neck strap muscles, but no other acute findings.  Data Reviewed: Basic Metabolic Panel:  Lab 01/23/12 1914 01/22/12 0554 01/21/12 1650  NA 138 136 137  K 4.0 3.4* 3.7  CL 97 98 99  CO2 32 29 28  GLUCOSE 162* 195* 186*  BUN 18 15 15   CREATININE 1.54* 1.50* 1.38*  CALCIUM 9.1 9.0 9.8  MG -- -- --  PHOS -- -- --   Liver Function Tests:  Lab 01/22/12 0554  AST 10  ALT 11  ALKPHOS 52  BILITOT 0.6  PROT 6.9  ALBUMIN 3.3*   CBC:  Lab 01/23/12 0428 01/22/12 0554 01/21/12 1650  WBC 6.5 6.7 6.6  NEUTROABS -- -- --  HGB 11.1* 10.7* 11.6*  HCT 34.5* 33.0* 35.4*  MCV 90.1 89.9 89.8  PLT 207 191 229   Cardiac Enzymes:  Lab 01/22/12 0553 01/21/12 2327  CKTOTAL -- --  CKMB -- --  CKMBINDEX -- --  TROPONINI <0.30 <0.30   BNP (last 3 results)  Basename 01/21/12 1740 01/03/12 2020 12/19/11 1310  PROBNP 2518.0* 2112.0*  2539.0*   CBG:  Lab 01/23/12 0608 01/22/12 2134 01/22/12 1629 01/22/12 1119 01/22/12 0734  GLUCAP 149* 133* 203* 152* 164*    Recent Results (from the past 240 hour(s))  MRSA PCR SCREENING     Status: Abnormal   Collection Time   01/21/12 11:42 PM      Component Value Range Status Comment   MRSA by PCR POSITIVE (*) NEGATIVE Final      Scheduled Meds:  Reviewed in detail by the Attending Physician   Dorwin Fitzhenry 10:06 AM Pager: [phone]

## 2012-01-24 DIAGNOSIS — F32A Depression, unspecified: Secondary | ICD-10-CM | POA: Diagnosis present

## 2012-01-24 DIAGNOSIS — F329 Major depressive disorder, single episode, unspecified: Secondary | ICD-10-CM | POA: Diagnosis present

## 2012-01-24 DIAGNOSIS — G4733 Obstructive sleep apnea (adult) (pediatric): Secondary | ICD-10-CM

## 2012-01-24 LAB — CBC
HCT: 36.5 % (ref 36.0–46.0)
Hemoglobin: 11.7 g/dL — ABNORMAL LOW (ref 12.0–15.0)
MCH: 29 pg (ref 26.0–34.0)
MCV: 90.3 fL (ref 78.0–100.0)
RBC: 4.04 MIL/uL (ref 3.87–5.11)

## 2012-01-24 LAB — BASIC METABOLIC PANEL
CO2: 34 mEq/L — ABNORMAL HIGH (ref 19–32)
Chloride: 96 mEq/L (ref 96–112)
GFR calc non Af Amer: 44 mL/min — ABNORMAL LOW (ref >90)
Glucose, Bld: 136 mg/dL — ABNORMAL HIGH (ref 70–99)
Potassium: 3.7 mEq/L (ref 3.5–5.1)
Sodium: 139 mEq/L (ref 135–145)

## 2012-01-24 LAB — GLUCOSE, CAPILLARY
Glucose-Capillary: 126 mg/dL — ABNORMAL HIGH (ref 70–99)
Glucose-Capillary: 146 mg/dL — ABNORMAL HIGH (ref 70–99)

## 2012-01-24 MED ORDER — FUROSEMIDE 20 MG PO TABS: 40.0000 mg | ORAL_TABLET | Freq: Two times a day (BID) | ORAL | Status: AC

## 2012-01-24 MED ORDER — FUROSEMIDE 10 MG/ML IJ SOLN
40.0000 mg | Freq: Four times a day (QID) | INTRAMUSCULAR | Status: DC
  Administered 2012-01-24: 40 mg via INTRAVENOUS
  Filled 2012-01-24: qty 4

## 2012-01-24 MED ORDER — ISOSORBIDE MONONITRATE ER 30 MG PO TB24: 30.0000 mg | ORAL_TABLET | Freq: Every day | ORAL | Status: AC

## 2012-01-24 MED ORDER — SERTRALINE HCL 25 MG PO TABS: 25.0000 mg | ORAL_TABLET | Freq: Every day | ORAL | Status: AC

## 2012-01-24 MED ORDER — SERTRALINE HCL 25 MG PO TABS
25.0000 mg | ORAL_TABLET | Freq: Every day | ORAL | Status: DC
  Administered 2012-01-24: 25 mg via ORAL
  Filled 2012-01-24: qty 1

## 2012-01-24 MED ORDER — ISOSORBIDE MONONITRATE ER 30 MG PO TB24
30.0000 mg | ORAL_TABLET | Freq: Every day | ORAL | Status: DC
  Administered 2012-01-24: 30 mg via ORAL
  Filled 2012-01-24: qty 1

## 2012-01-24 NOTE — Clinical Documentation Improvement (Signed)
GENERIC DOCUMENTATION CLARIFICATION QUERY  THIS DOCUMENT IS NOT A PERMANENT PART OF THE MEDICAL RECORD  TO RESPOND TO THE THIS QUERY, FOLLOW THE INSTRUCTIONS BELOW:  1. If needed, update documentation for the patient's encounter via the notes activity.  2. Access this query again and click edit on the In [employer].  3. After updating, or not, click F2 to complete all highlighted (required) fields concerning your review. Select "additional documentation in the medical record" OR "no additional documentation provided".  4. Click Sign note button.  5. The deficiency will fall out of your In Basket *Please let us know if you are not able to complete this workflow by phone or e-mail (listed below).  Please update your documentation within the medical record to reflect your response to this query.                                                                                        01/24/12   Dear Dr. Ellin Saba / Associates,  In a better effort to capture your patient's severity of illness, reflect appropriate length of stay and utilization of resources, a review of the patient medical record has revealed the following indicators.    Based on your clinical judgment, please clarify and document in a progress note and/or discharge summary the clinical condition associated with the following supporting information:  In responding to this query please exercise your independent judgment.  The fact that a query is asked, does not imply that any particular answer is desired or expected.  Per documentation patient with a suspected h/o of polysubstance abuse. Drug screen this admit showing "positive finding of cocaine and tetrahydrocannabinol ", would the following diagnosis be appropriate for this patient? If known please document.  Thank you   Possible Clinical Condition?   Polysubstance dependence, continuous  Other Condition  Cannot Clinically Determine     Treatment: Social work  consult for out pt rehab treatment   You may use possible, probable, or suspect with inpatient documentation. possible, probable, suspected diagnoses MUST be documented at the time of discharge  Reviewed: no response from MD, in basket messge left 9/12 will close in chl follow up in [place]  Thank You,  Leonette Most Anjanette Gilkey  Clinical Documentation Specialist RN, BSN:  Pager [phone]  [facility] # [phone]  Health Information Management [place]

## 2012-01-24 NOTE — Progress Notes (Signed)
[facility] Progress Note [place] TEAM 1 - Stepdown/ICU TEAM   Debra Stokes RUE:[id number] DOB: [birth year] DOA: 01/21/2012 PCP: Lonia Blood, MD  Brief narrative: 36 year old female patient with multiple medical problems including diastolic heart failure and morbid obesity with hypertension. Previous evaluation in August 2013 for complaints of chest pain or shortness of breath associated with worsening lower extremity edema. At that time CT of the chest was negative for PE and cardiac enzymes were negative. Patient had negative cardiac catheterization in April 2013 at [facility] in [place]. She presents this time with complaints of increasing lower extremity edema. She made an attempt to increase her baseline Lasix from 20 to 40 mg daily without improvement in her symptoms. At time of presentation to the emergency department her blood pressure was in the 200s and therefore she was started on a nitroglycerin drip. Urine drug screen was also obtained because of the patient's prior history of polysubstance abuse and this did demonstrate a positive finding of cocaine and tetrahydrocannabinol although the patient denied any recent use of cocaine. She was admitted to the step down unit for further evaluation and treatment  Assessment/Plan:  Diastolic CHF, acute on chronic diastolic heart failure, NYHA class 2 vs/ cocaine inhalation induced pulmonary edema Started on iv lasix on admission  *still volume overloaded with DOE and PND  Increased  Lasix 40 mg IV to every 6 hrs on 9/11/    Acute respiratory failure with hypoxia - on a background of Chronic respiratory failure due to OHS /OSA on home oxygen  *Resolved - currently on room air obesity.   CKD (chronic kidney disease) stage 3, GFR 30-59 ml/min -stable   Diabetes mellitus type 2 with complications, uncontrolled *CBGs better controlled since admission *Continue Lantus and sliding scale insulin *Hemoglobin A1c:  8.3  Asthma/  *Currently compensated  Chest pain *Resolved and suspect related to uncontrolled blood pressure +/- cocaine induced vasospasm *Cardiac enzymes and EKG nonischemic and patient recently had apparent normal cardiac catheterization April of this year   Hypertensive emergency - likely cocaine induced Received initially  IV nitroglycerin then we resumed  usual antihypertensive medications-clonidine, hydralazine and diltiazem Added Imdur 9/11  Morbid obesity with BMI of 50.0-59.9, adult  Polysubstance abuse/Tobacco use *Urine drug screen positive for cocaine and tetrahydrocannabinol SW consult Obstructive sleep apnea Has not done sleep study: advised to get done as OP through PCP.  Neck Pain/sprain Prn pain meds. Heat pad.  Anemia Possibly chronic. Stable.  Depression - started SSRI Zoloft 25 / Qd 9/11  DVT prophylaxis: Subcutaneous heparin every 8 hours Code Status: Full Family Communication: patient and significant other  Disposition Plan: home   Consultants: None  Procedures: None  Antibiotics: None  HPI/Subjective: Has DOE.    Objective: Blood pressure 141/84, pulse 70, temperature 98.5 F (36.9 C), temperature source Oral, resp. rate 18, height 5\' 5"  (1.651 m), weight 153.9 kg (339 lb 4.6 oz), last menstrual period 12/24/2011, SpO2 100.00%.  Intake/Output Summary (Last 24 hours) at 01/24/12 0932 Last data filed at 01/24/12 0647  Gross per 24 hour  Intake    840 ml  Output   5750 ml  Net  -4910 ml     Exam: General: No acute respiratory distress at rest. Morbidly Obese Lungs: Distant breath sounds due to body habitus - reduced BS's in bases. Rest of lung fields CTA. No increased WOB. Cardiovascular: Distant heart sounds due to body habitus - Regular rate and rhythm without murmur gallop or rub normal S1  and S2,1+ bilateral lower extremity edema. Telemetry showns SR. Abdomen: Nontender, nondistended, soft, bowel sounds positive, no rebound,  no ascites, no appreciable mass Musculoskeletal: No significant cyanosis, clubbing of extremities   Data Reviewed: Basic Metabolic Panel:  Lab 01/24/12 1308 01/23/12 0428 01/22/12 0554 01/21/12 1650  NA 139 138 136 137  K 3.7 4.0 3.4* 3.7  CL 96 97 98 99  CO2 34* 32 29 28  GLUCOSE 136* 162* 195* 186*  BUN 20 18 15 15   CREATININE 1.50* 1.54* 1.50* 1.38*  CALCIUM 9.6 9.1 9.0 9.8  MG -- -- -- --  PHOS -- -- -- --   Liver Function Tests:  Lab 01/22/12 0554  AST 10  ALT 11  ALKPHOS 52  BILITOT 0.6  PROT 6.9  ALBUMIN 3.3*   CBC:  Lab 01/24/12 0410 01/23/12 0428 01/22/12 0554 01/21/12 1650  WBC 6.5 6.5 6.7 6.6  NEUTROABS -- -- -- --  HGB 11.7* 11.1* 10.7* 11.6*  HCT 36.5 34.5* 33.0* 35.4*  MCV 90.3 90.1 89.9 89.8  PLT 243 207 191 229   Cardiac Enzymes:  Lab 01/22/12 0553 01/21/12 2327  CKTOTAL -- --  CKMB -- --  CKMBINDEX -- --  TROPONINI <0.30 <0.30   BNP (last 3 results)  Basename 01/21/12 1740 01/03/12 2020 12/19/11 1310  PROBNP 2518.0* 2112.0* 2539.0*   CBG:  Lab 01/24/12 0604 01/23/12 2106 01/23/12 1556 01/23/12 1102 01/23/12 0608  GLUCAP 126* 157* 154* 149* 149*    Recent Results (from the past 240 hour(s))  MRSA PCR SCREENING     Status: Abnormal   Collection Time   01/21/12 11:42 PM      Component Value Range Status Comment   MRSA by PCR POSITIVE (*) NEGATIVE Final      Scheduled Meds:     . aspirin  81 mg Oral Daily  . Chlorhexidine Gluconate Cloth  6 each Topical Q0600  . cloNIDine  0.1 mg Oral BID  . diltiazem  120 mg Oral Daily  . furosemide  40 mg Intravenous Q6H  . heparin  5,000 Units Subcutaneous Q8H  . hydrALAZINE  50 mg Oral Q8H  . insulin aspart  0-15 Units Subcutaneous TID WC  . insulin glargine  25 Units Subcutaneous QHS  . isosorbide mononitrate  30 mg Oral Daily  . mupirocin ointment  1 application Nasal BID  . potassium chloride  20 mEq Oral BID  . sertraline  25 mg Oral Daily  . sodium chloride  3 mL Intravenous Q12H    . DISCONTD: furosemide  40 mg Intravenous Q12H      Debra Stokes 9:32 AM Pager: [phone]

## 2012-01-24 NOTE — Clinical Documentation Improvement (Signed)
Hypertension Documentation Clarification Query  THIS DOCUMENT IS NOT A PERMANENT PART OF THE MEDICAL RECORD  TO RESPOND TO THE THIS QUERY, FOLLOW THE INSTRUCTIONS BELOW:  1. If needed, update documentation for the patient's encounter via the notes activity.  2. Access this query again and click edit on the In [employer].  3. After updating, or not, click F2 to complete all highlighted (required) fields concerning your review. Select "additional documentation in the medical record" OR "no additional documentation provided".  4. Click Sign note button.  5. The deficiency will fall out of your In Basket *Please let us know if you are not able to complete this workflow by phone or e-mail (listed below).        01/24/12  Dear Dr. Kathie Rhodes. [facility]  In an effort to better capture your patient's severity of illness, reflect appropriate length of stay and utilization of resources, a review of the patient medical record has revealed the following indicators.    Based on your clinical judgment, please clarify and document in a progress note and/or discharge summary the clinical condition associated with the following supporting information:  In responding to this query please exercise your independent judgment.  The fact that a query is asked, does not imply that any particular answer is desired or expected.  Per documentation patient admitted with c/o "increasing lower extremity edema",  "chest pain" , and "sob"with systolic BP in  200's. Please clarify term "Hypertensive emergency" if possible. Thank you     Possible Clinical Conditions?     " Accelerated Hypertension  " Malignant Hypertension  " Or Other Condition  " Cannot Clinically Determine    Drips: Ntg drip started on admission, then resumed po hypertensive meds     You may use possible, probable, or suspect with inpatient documentation. Possible, probable, suspected diagnoses MUST be documented at the time of  discharge.  Reviewed: no response from MD inbasket message left 9/12 will close for coder to f/up   Thank You,  Lavonda Jumbo  Clinical Documentation Specialist RN, BSN:  Pager [phone]  [facility] # [phone]  Health Information Management [place]

## 2012-01-24 NOTE — Progress Notes (Addendum)
Pt complained of headache. Offered PO vicodin and tylenol but refused to take it. Pt requested IV morphine because it works faster per pt. Encouraged Pt to try PO med next time and she said she will. Will follow up.

## 2012-01-24 NOTE — Discharge Summary (Signed)
Physician Discharge Summary  Debra Stokes ZHY:[id number] DOB: 1975-06-25 DOA: 01/21/2012  PCP: Lonia Blood, MD  Admit date: 01/21/2012 Discharge date: 01/24/2012  Recommendations for Outpatient Follow-up:  1. Needs carefull monitoring for substance abuse 2. Needs ongoing education about CHF 3. Needs outpatient sleep study   Discharge Diagnoses:  Principal Problem:  *Diastolic CHF, acute on chronic Active Problems:  Diabetes mellitus type 2 with complications, uncontrolled  Morbid obesity with BMI of 50.0-59.9, adult  Polysubstance abuse  COPD (chronic obstructive pulmonary disease)  Tobacco use  Obstructive sleep apnea  Chest pain  Hypertensive emergency  Chronic diastolic heart failure, NYHA class 2  Acute respiratory failure with hypoxia  CKD (chronic kidney disease) stage 3, GFR 30-59 ml/min  Depression   Discharge Condition: good  Diet recommendation: heart healthy  Filed Weights   01/21/12 2303 01/24/12 0559  Weight: 159.2 kg (350 lb 15.6 oz) 153.9 kg (339 lb 4.6 oz)    History of present illness:  36 year old female patient with multiple medical problems including diastolic heart failure and morbid obesity with hypertension. Previous evaluation in August 2013 for complaints of chest pain or shortness of breath associated with worsening lower extremity edema. At that time CT of the chest was negative for PE and cardiac enzymes were negative. Patient had negative cardiac catheterization in April 2013 at [facility] in [place]. She presents this time with complaints of increasing lower extremity edema. She made an attempt to increase her baseline Lasix from 20 to 40 mg daily without improvement in her symptoms. At time of presentation to the emergency department her blood pressure was in the 200s and therefore she was started on a nitroglycerin drip. Urine drug screen was also obtained because of the patient's prior history of polysubstance abuse and this did  demonstrate a positive finding of cocaine and tetrahydrocannabinol although the patient denied any recent use of cocaine. She was admitted to the step down unit for further evaluation and treatment      Hospital Course:  Diastolic CHF, acute on chronic diastolic heart failure, NYHA class 2 with component of  cocaine inhalation induced pulmonary edema  Started on iv lasix on admission. Improved gradually during hospitalization. Briefly required Bipap support   - received high doses of Lasix  IV  And then was transitioned to oral diuretics at time of DC Acute respiratory failure with hypoxia - on a background of Chronic respiratory failure due to CHF and OHS /OSA Patient required Brief Bipap support in stepdwon unit.   CKD (chronic kidney disease) stage 3, GFR 30-59 ml/min  -stable  Diabetes mellitus type 2 with complications, uncontrolled  *Initially patient had uncontrolled hyperglycemia. Once we resumed lantus and novolog she had better control *Hemoglobin A1c: 8.3 was measured during admission.   Chest pain  *Resolved and suspect related to uncontrolled blood pressure +/- cocaine induced vasospasm  *Cardiac enzymes and EKG nonischemic and patient recently had apparent normal cardiac catheterization April of this year  Hypertensive emergency - likely cocaine induced  Received initially IV nitroglycerin then we resumed usual antihypertensive medications-clonidine, hydralazine and diltiazem  Added Imdur 9/11   Polysubstance abuse/Tobacco use  *Urine drug screen positive for cocaine and tetrahydrocannabinol  SW consulted on patient and provided her with resources to utilize in outpatient setting for substance abuse.   Obstructive sleep apnea  Has not done sleep study: advised to get done as OP through PCP.    Depression - started SSRI Zoloft 25 / Qd 9/11   Procedures:  none  Consultations:  none  Discharge Exam: Filed Vitals:   01/24/12 0559 01/24/12 1046 01/24/12 1358  01/24/12 1359  BP: 141/84 175/65 126/83 136/88  Pulse: 70  70   Temp: 98.5 F (36.9 C)  98.5 F (36.9 C)   TempSrc: Oral  Oral   Resp:   20   Height:      Weight: 153.9 kg (339 lb 4.6 oz)     SpO2: 100%  99%     General: axox3 Cardiovascular: rrr Respiratory: catb  Discharge Instructions  Discharge Orders    Future Orders Please Complete By Expires   Diet - low sodium heart healthy      Increase activity slowly      (HEART FAILURE PATIENTS) Call MD:  Anytime you have any of the following symptoms: 1) 3 pound weight gain in 24 hours or 5 pounds in 1 week 2) shortness of breath, with or without a dry hacking cough 3) swelling in the hands, feet or stomach 4) if you have to sleep on extra pillows at night in order to breathe.          Medication List     As of 01/24/2012  4:42 PM    TAKE these medications         acetaminophen 325 MG tablet   Commonly known as: TYLENOL   Take 650 mg by mouth every 6 (six) hours as needed. Pain/fever.      albuterol 108 (90 BASE) MCG/ACT inhaler   Commonly known as: PROVENTIL HFA;VENTOLIN HFA   Inhale 2 puffs into the lungs every 6 (six) hours as needed. For asthma attacks.      aspirin 81 MG chewable tablet   Chew 81 mg by mouth daily.      cloNIDine 0.1 MG tablet   Commonly known as: CATAPRES   Take 0.1 mg by mouth 2 (two) times daily.      diltiazem 120 MG 24 hr capsule   Commonly known as: CARDIZEM CD   Take 120 mg by mouth daily.      exenatide 5 MCG/0.02ML Soln   Commonly known as: BYETTA   Inject 5 mcg into the skin every morning.      furosemide 20 MG tablet   Commonly known as: LASIX   Take 2 tablets (40 mg total) by mouth 2 (two) times daily.      hydrALAZINE 25 MG tablet   Commonly known as: APRESOLINE   Take 50 mg by mouth every 8 (eight) hours.      insulin aspart 100 UNIT/ML injection   Commonly known as: novoLOG   Inject 0-20 Units into the skin 4 (four) times daily -  with meals and at bedtime. Sliding  scale.      insulin glargine 100 UNIT/ML injection   Commonly known as: LANTUS   Inject 40 Units into the skin at bedtime.      isosorbide mononitrate 30 MG 24 hr tablet   Commonly known as: IMDUR   Take 1 tablet (30 mg total) by mouth daily.      LORazepam 2 MG tablet   Commonly known as: ATIVAN   Take 2 mg by mouth every 6 (six) hours as needed. For anxiety.      potassium chloride SA 20 MEQ tablet   Commonly known as: K-DUR,KLOR-CON   Take 40 mEq by mouth daily.      sertraline 25 MG tablet   Commonly known as: ZOLOFT   Take 1 tablet (  25 mg total) by mouth daily.          The results of significant diagnostics from this hospitalization (including imaging, microbiology, ancillary and laboratory) are listed below for reference.    Significant Diagnostic Studies: Ct Angio Chest Pe W/cm &/or Wo Cm  01/21/2012  *RADIOLOGY REPORT*  Clinical Data: Shortness of breath.  Chest pain.  Elevated blood pressure.  Elevated D-dimer.  CT ANGIOGRAPHY CHEST  Technique:  Multidetector CT imaging of the chest using the standard protocol during bolus administration of intravenous contrast. Multiplanar reconstructed images including MIPs were obtained and reviewed to evaluate the vascular anatomy.  Contrast:  100 ml Omnipaque 300  Comparison: 01/03/2012  Findings: Technically adequate study with moderately good opacification of the central and proximal segmental pulmonary arteries.  Distal segmental and peripheral pulmonary arteries are not well demonstrated.  No filling defects demonstrated centrally suggesting no evidence of significant pulmonary embolus.  Normal caliber thoracic aorta with limited contrast bolus.  No gross dissection although visualization of the aortic lumen is limited due to the technique.  Cardiac enlargement.  Pulmonary vascular congestion with hazy opacities in the lungs suggesting edema. Visualization of the lung fields is limited due to ration artifact. Atelectasis in the lung  bases.  No pleural effusions.  No pneumothorax.  Airways appear patent. Borderline prominent mediastinal lymph nodes appear unchanged since the previous study. Visualized portions of the upper abdominal organs are grossly unremarkable.  Degenerative changes in the thoracic spine.  IMPRESSION: No evidence of significant pulmonary embolus.  Cardiac enlargement with pulmonary vascular congestion and mild edema.   Original Report Authenticated By: Marlon Pel, M.D.    Ct Angio Chest Pe W/cm &/or Wo Cm  01/03/2012  *RADIOLOGY REPORT*  Clinical Data: Chest pain short of breath  CT ANGIOGRAPHY CHEST  Technique:  Multidetector CT imaging of the chest using the standard protocol during bolus administration of intravenous contrast. Multiplanar reconstructed images including MIPs were obtained and reviewed to evaluate the vascular anatomy.  Contrast: OMNIPAQUE IOHEXOL 350 MG/ML SOLN  Comparison: CT 10/08/2011  Findings: Negative for pulmonary embolism.  Negative for aortic aneurysm.  Marked cardiac enlargement with four-chamber dilatation.  Pulmonary artery enlargement suggesting pulmonary hypertension.  Pulmonary veins are distended and there is mild edema in the lungs.  No pleural effusion.  Negative for pneumonia.  No mass or adenopathy.  IMPRESSION: Negative for pulmonary embolism.  Marked cardiac enlargement with pulmonary artery hypertension and pulmonary venous hypertension.  Mild pulmonary edema is present.   Original Report Authenticated By: Camelia Phenes, M.D.    Dg Chest Portable 1 View  01/21/2012  *RADIOLOGY REPORT*  Clinical Data: Shortness of breath.  Chest pain.  Edema in the feet.  PORTABLE CHEST - 1 VIEW  Comparison: Chest x-ray 01/03/2012.  Findings: Lung volumes are low.  Bibasilar opacities (left greater than right) are favored to be artifactual related to underpenetration of the film, but may relate to areas of subsegmental atelectasis.  No definite consolidative airspace disease.  No  pleural effusions.  Pulmonary venous congestion without frank pulmonary edema.  Cardiomegaly is again noted. The patient is rotated to the left on today's exam, resulting in distortion of the mediastinal contours and reduced diagnostic sensitivity and specificity for mediastinal pathology.  IMPRESSION: 1.  Low lung volumes. 2.  Cardiomegaly with mild pulmonary venous congestion, but no frank pulmonary edema.   Original Report Authenticated By: Florencia Reasons, M.D.    Dg Chest Port 1 View  01/03/2012  *  RADIOLOGY REPORT*  Clinical Data: Chest pain  PORTABLE CHEST - 1 VIEW  Comparison: 12/19/2011  Findings: Cardiac enlargement with vascular congestion.  Mild interstitial edema is present.  No pleural effusion.  Left lower lobe atelectasis.  IMPRESSION: Congestive heart failure with mild interstitial edema.   Original Report Authenticated By: Camelia Phenes, M.D.     Microbiology: Recent Results (from the past 240 hour(s))  MRSA PCR SCREENING     Status: Abnormal   Collection Time   01/21/12 11:42 PM      Component Value Range Status Comment   MRSA by PCR POSITIVE (*) NEGATIVE Final      Labs: Basic Metabolic Panel:  Lab 01/24/12 1610 01/23/12 0428 01/22/12 0554 01/21/12 1650  NA 139 138 136 137  K 3.7 4.0 3.4* 3.7  CL 96 97 98 99  CO2 34* 32 29 28  GLUCOSE 136* 162* 195* 186*  BUN 20 18 15 15   CREATININE 1.50* 1.54* 1.50* 1.38*  CALCIUM 9.6 9.1 9.0 9.8  MG -- -- -- --  PHOS -- -- -- --   Liver Function Tests:  Lab 01/22/12 0554  AST 10  ALT 11  ALKPHOS 52  BILITOT 0.6  PROT 6.9  ALBUMIN 3.3*   No results found for this basename: LIPASE:5,AMYLASE:5 in the last 168 hours No results found for this basename: AMMONIA:5 in the last 168 hours CBC:  Lab 01/24/12 0410 01/23/12 0428 01/22/12 0554 01/21/12 1650  WBC 6.5 6.5 6.7 6.6  NEUTROABS -- -- -- --  HGB 11.7* 11.1* 10.7* 11.6*  HCT 36.5 34.5* 33.0* 35.4*  MCV 90.3 90.1 89.9 89.8  PLT 243 207 191 229   Cardiac  Enzymes:  Lab 01/22/12 0553 01/21/12 2327  CKTOTAL -- --  CKMB -- --  CKMBINDEX -- --  TROPONINI <0.30 <0.30   BNP: BNP (last 3 results)  Basename 01/21/12 1740 01/03/12 2020 12/19/11 1310  PROBNP 2518.0* 2112.0* 2539.0*   CBG:  Lab 01/24/12 1135 01/24/12 0604 01/23/12 2106 01/23/12 1556 01/23/12 1102  GLUCAP 146* 126* 157* 154* 149*    Time coordinating discharge: 35 minutes  Signed:  Sheela Mcculley  [facility] 01/24/2012, 4:42 PM

## 2012-01-24 NOTE — Progress Notes (Signed)
Pt wishes to DC AMA due to situation with her kids at home.  MD paged.

## 2012-01-24 NOTE — Care Management Note (Signed)
    Page 1 of 1   01/24/2012     3:03:41 PM   CARE MANAGEMENT NOTE 01/24/2012  Patient:  Debra Stokes, Debra Stokes   Account Number:  [id number]  Date Initiated:  01/22/2012  Documentation initiated by:  Donn Pierini  Subjective/Objective Assessment:   Pt admitted with HTN urgency     Action/Plan:   PTA pt lived at home with family   Anticipated DC Date:  01/25/2012   Anticipated DC Plan:  HOME/SELF CARE      DC Planning Services  CM consult      Choice offered to / List presented to:             Status of service:  In process, will continue to follow Medicare Important Message given?   (If response is "NO", the following Medicare IM given date fields will be blank) Date Medicare IM given:   Date Additional Medicare IM given:    Discharge Disposition:    Per UR Regulation:  Reviewed for med. necessity/level of care/duration of stay  If discussed at Long Length of Stay Meetings, dates discussed:    Comments:  PCP-  Lonell Face  01-24-12 2:55pm Avie Arenas, RNBSN [phone] Talked with patient in room. States Girlfriend in room with her and I can speak in front of her. Patient states currently has [facility] Nurse that comes out and sees her and has oxygen at 2 liters per [place] through [facility].  Notified [facility] laison - [facility].   Will need orders on discharge for continuation of [facility] RN.

## 2012-01-31 ENCOUNTER — Emergency Department (HOSPITAL_COMMUNITY)
Admission: EM | Admit: 2012-01-31 | Discharge: 2012-01-31 | Disposition: A | Discharge status: Home / Self Care | Payer: Medicare Other | Attending: Emergency Medicine | Admitting: Emergency Medicine

## 2012-01-31 ENCOUNTER — Encounter (HOSPITAL_COMMUNITY): Payer: Self-pay | Admitting: *Deleted

## 2012-01-31 DIAGNOSIS — Z7982 Long term (current) use of aspirin: Secondary | ICD-10-CM | POA: Insufficient documentation

## 2012-01-31 DIAGNOSIS — G8929 Other chronic pain: Secondary | ICD-10-CM | POA: Insufficient documentation

## 2012-01-31 DIAGNOSIS — M549 Dorsalgia, unspecified: Secondary | ICD-10-CM | POA: Insufficient documentation

## 2012-01-31 DIAGNOSIS — K5289 Other specified noninfective gastroenteritis and colitis: Secondary | ICD-10-CM | POA: Insufficient documentation

## 2012-01-31 DIAGNOSIS — E119 Type 2 diabetes mellitus without complications: Secondary | ICD-10-CM | POA: Insufficient documentation

## 2012-01-31 DIAGNOSIS — IMO0001 Reserved for inherently not codable concepts without codable children: Secondary | ICD-10-CM | POA: Insufficient documentation

## 2012-01-31 DIAGNOSIS — F17200 Nicotine dependence, unspecified, uncomplicated: Secondary | ICD-10-CM | POA: Insufficient documentation

## 2012-01-31 DIAGNOSIS — Z794 Long term (current) use of insulin: Secondary | ICD-10-CM | POA: Insufficient documentation

## 2012-01-31 DIAGNOSIS — K529 Noninfective gastroenteritis and colitis, unspecified: Secondary | ICD-10-CM

## 2012-01-31 DIAGNOSIS — J45909 Unspecified asthma, uncomplicated: Secondary | ICD-10-CM | POA: Insufficient documentation

## 2012-01-31 DIAGNOSIS — I10 Essential (primary) hypertension: Secondary | ICD-10-CM | POA: Insufficient documentation

## 2012-01-31 DIAGNOSIS — M791 Myalgia, unspecified site: Secondary | ICD-10-CM

## 2012-01-31 LAB — CBC WITH DIFFERENTIAL/PLATELET
Basophils Absolute: 0 10*3/uL (ref 0.0–0.1)
Eosinophils Absolute: 0 10*3/uL (ref 0.0–0.7)
Eosinophils Relative: 0 % (ref 0–5)
Lymphocytes Relative: 4 % — ABNORMAL LOW (ref 12–46)
MCH: 29.4 pg (ref 26.0–34.0)
MCV: 89.5 fL (ref 78.0–100.0)
Platelets: 265 10*3/uL (ref 150–400)
RDW: 15.6 % — ABNORMAL HIGH (ref 11.5–15.5)
WBC: 11.8 10*3/uL — ABNORMAL HIGH (ref 4.0–10.5)

## 2012-01-31 LAB — BASIC METABOLIC PANEL
Calcium: 9.1 mg/dL (ref 8.4–10.5)
GFR calc non Af Amer: 44 mL/min — ABNORMAL LOW (ref >90)
Sodium: 133 mEq/L — ABNORMAL LOW (ref 135–145)

## 2012-01-31 MED ORDER — DIPHENHYDRAMINE HCL 50 MG/ML IJ SOLN
25.0000 mg | Freq: Once | INTRAMUSCULAR | Status: AC
  Administered 2012-01-31: 25 mg via INTRAVENOUS
  Filled 2012-01-31: qty 1

## 2012-01-31 MED ORDER — METOCLOPRAMIDE HCL 10 MG PO TABS: 10.0000 mg | ORAL_TABLET | Freq: Four times a day (QID) | ORAL | Status: DC | PRN

## 2012-01-31 MED ORDER — ONDANSETRON HCL 4 MG/2ML IJ SOLN
4.0000 mg | Freq: Once | INTRAMUSCULAR | Status: AC
  Administered 2012-01-31: 4 mg via INTRAVENOUS
  Filled 2012-01-31: qty 2

## 2012-01-31 MED ORDER — METOCLOPRAMIDE HCL 5 MG/ML IJ SOLN
10.0000 mg | Freq: Once | INTRAMUSCULAR | Status: AC
  Administered 2012-01-31: 10 mg via INTRAVENOUS
  Filled 2012-01-31: qty 2

## 2012-01-31 MED ORDER — OXYCODONE-ACETAMINOPHEN 5-325 MG PO TABS: 1.0000 | ORAL_TABLET | ORAL | Status: DC | PRN

## 2012-01-31 MED ORDER — SODIUM CHLORIDE 0.9 % IV BOLUS (SEPSIS)
1000.0000 mL | Freq: Once | INTRAVENOUS | Status: AC
  Administered 2012-01-31: 1000 mL via INTRAVENOUS

## 2012-01-31 MED ORDER — LOPERAMIDE HCL 2 MG PO CAPS
4.0000 mg | ORAL_CAPSULE | Freq: Once | ORAL | Status: AC
  Administered 2012-01-31: 4 mg via ORAL
  Filled 2012-01-31: qty 2

## 2012-01-31 MED ORDER — SODIUM CHLORIDE 0.9 % IV SOLN
Freq: Once | INTRAVENOUS | Status: AC
  Administered 2012-01-31: 12:00:00 via INTRAVENOUS

## 2012-01-31 MED ORDER — MORPHINE SULFATE 4 MG/ML IJ SOLN
4.0000 mg | Freq: Once | INTRAMUSCULAR | Status: AC
  Administered 2012-01-31: 4 mg via INTRAVENOUS
  Filled 2012-01-31: qty 1

## 2012-01-31 MED ORDER — KETOROLAC TROMETHAMINE 30 MG/ML IJ SOLN
30.0000 mg | Freq: Once | INTRAMUSCULAR | Status: AC
  Administered 2012-01-31: 30 mg via INTRAVENOUS
  Filled 2012-01-31: qty 1

## 2012-01-31 NOTE — ED Notes (Signed)
Pt comes in from home states that she has been having chest pain and really bad aches among other aches and pains. States that it started yesterday and she has also been having chills, nausea, vomiting, and diarrhea. Pt states that she hurts from the top of her head down to her feet.

## 2012-01-31 NOTE — ED Notes (Signed)
Patient asked about food and I asked the nurse and she no food because of the patient body aches

## 2012-01-31 NOTE — ED Provider Notes (Signed)
History     CSN: [id number]  Arrival date & time 01/31/12  0848   First MD Initiated Contact with Patient 01/31/12 [id number]      Chief Complaint  Patient presents with  . Generalized Body Aches    (Consider location/radiation/quality/duration/timing/severity/associated sxs/prior treatment) The history is provided by the patient.   36 year old female had onset yesterday afternoon of chills, generalized myalgias, nausea, vomiting, diarrhea. She is vomited multiple times and states that she has diarrhea every time she goes to bathroom to urinate. Pain is severe and she rates it 10/10. Nothing makes it better nothing makes it worse. She's not been able to take anything because of persistent vomiting. She denies any sick contacts. She's not aware of a fever, but she has broken out in sweats.  Past Medical History  Diagnosis Date  . Diabetes mellitus   . Asthma   . CHF (congestive heart failure)   . Chronic back pain   . Migraine headache   . Hypertension   . Morbid obesity   . Polysubstance abuse   . Sleep apnea   . Drug-seeking behavior   . Urinary tract infection   . Arthritis     Past Surgical History  Procedure Date  . Cholecystectomy   . Cardiac catheterization     two, no blockage    Family History  Problem Relation Age of Onset  . Other Neg Hx     History  Substance Use Topics  . Smoking status: Current Every Day Smoker -- 0.5 packs/day for 16 years    Types: Cigarettes  . Smokeless tobacco: Never Used  . Alcohol Use: No     quit 5 yrs ago    OB History    Grav Para Term Preterm Abortions TAB SAB Ect Mult Living   1    1  1    0      Review of Systems  All other systems reviewed and are negative.    Allergies  Review of patient's allergies indicates no known allergies.  Home Medications   Current Outpatient Rx  Name Route Sig Dispense Refill  . ACETAMINOPHEN 325 MG PO TABS Oral Take 650 mg by mouth every 6 (six) hours as needed. Pain/fever.    .  ALBUTEROL SULFATE HFA 108 (90 BASE) MCG/ACT IN AERS Inhalation Inhale 2 puffs into the lungs every 6 (six) hours as needed. For asthma attacks.    . ASPIRIN 81 MG PO CHEW Oral Chew 81 mg by mouth daily.    Marland Kitchen CLONIDINE HCL 0.1 MG PO TABS Oral Take 0.1 mg by mouth 2 (two) times daily.    Marland Kitchen DILTIAZEM HCL ER COATED BEADS 120 MG PO CP24 Oral Take 120 mg by mouth daily.    Marland Kitchen EXENATIDE 5 MCG/0.02ML [place] SOLN Subcutaneous Inject 5 mcg into the skin every morning.    . FUROSEMIDE 20 MG PO TABS Oral Take 2 tablets (40 mg total) by mouth 2 (two) times daily. 60 tablet 0  . HYDRALAZINE HCL 25 MG PO TABS Oral Take 50 mg by mouth every 8 (eight) hours.    . INSULIN ASPART 100 UNIT/ML [place] SOLN Subcutaneous Inject 0-20 Units into the skin 4 (four) times daily -  with meals and at bedtime. Sliding scale.    . INSULIN GLARGINE 100 UNIT/ML [place] SOLN Subcutaneous Inject 40 Units into the skin at bedtime.     . ISOSORBIDE MONONITRATE ER 30 MG PO TB24 Oral Take 1 tablet (30 mg total) by mouth daily.  30 tablet 0  . LORAZEPAM 2 MG PO TABS Oral Take 2 mg by mouth every 6 (six) hours as needed. For anxiety.    Marland Kitchen POTASSIUM CHLORIDE CRYS ER 20 MEQ PO TBCR Oral Take 40 mEq by mouth daily.    . SERTRALINE HCL 25 MG PO TABS Oral Take 1 tablet (25 mg total) by mouth daily. 30 tablet 0    BP 158/86  Pulse 98  Temp 99.7 F (37.6 C) (Oral)  Resp 20  Ht 5' 5.5" (1.664 m)  Wt 343 lb (155.584 kg)  BMI 56.21 kg/m2  SpO2 100%  LMP 12/24/2011  Physical Exam  Nursing note and vitals reviewed. 36 year old female who appears to be in pain, but is in no acute respiratory distress. Vital signs are significant for hypertension with blood pressure 158/86. Oxygen saturation is 100%, which is normal. Head is normocephalic and atraumatic. PERRLA, EOMI. Oropharynx is clear. Neck is nontender and supple without adenopathy or JVD. Back is nontender and there is no CVA tenderness. Lungs are clear without rales, wheezes, or rhonchi. Chest is  nontender. Heart has regular rate and rhythm without murmur. Abdomen is obese, soft, flat, nontender without masses or hepatosplenomegaly and peristalsis is hypoactive. Extremities have no cyanosis or edema, full range of motion is present. Skin is warm and dry without rash. Neurologic: Mental status is normal, cranial nerves are intact, there are no motor or sensory deficits.   ED Course  Procedures (including critical care time)  Labs Reviewed  GLUCOSE, CAPILLARY - Abnormal; Notable for the following:    Glucose-Capillary 322 (*)     All other components within normal limits   No results found.   Date: 01/31/2012  Rate: 92  Rhythm: normal sinus rhythm  QRS Axis: normal  Intervals: QT prolonged  ST/T Wave abnormalities: nonspecific T wave changes  Conduction Disutrbances:none  Narrative Interpretation: Left atrial hypertrophy, nonspecific T wave flattening, prolonged QT interval. When compared with ECG of 01/22/2012, no significant changes are seen.  Old EKG Reviewed: unchanged    1. Gastroenteritis   2. Myalgia       MDM  Nausea, vomiting, chills, body aches which seems most compatible with an acute viral gastroenteritis. Laboratory workup has been ordered and she'll be given IV fluids as well as IV ondansetron, IV ketorolac, and oral loperamide.  After initial IV hydration and IV ondansetron and oral loperamide, she stated she didn't feel any better. She was given additional IV fluids and given IV metoclopramide with diphenhydramine and stated she felt much better. She'll be discharged with prescriptions for metoclopramide and Percocet for pain. She is to take over-the-counter loperamide as needed for diarrhea.      Dione Booze, MD 01/31/12 [id number]

## 2012-02-02 ENCOUNTER — Encounter (HOSPITAL_COMMUNITY): Payer: Self-pay

## 2012-02-02 ENCOUNTER — Inpatient Hospital Stay (HOSPITAL_COMMUNITY)
Admission: EM | Admit: 2012-02-02 | Discharge: 2012-02-05 | DRG: 872 | Discharge status: Against Medical Advice | Payer: Medicare Other | Attending: Family Medicine | Admitting: Family Medicine

## 2012-02-02 ENCOUNTER — Other Ambulatory Visit: Payer: Self-pay | Admitting: Diagnostic Radiology

## 2012-02-02 ENCOUNTER — Emergency Department (HOSPITAL_COMMUNITY): Payer: Medicare Other

## 2012-02-02 ENCOUNTER — Inpatient Hospital Stay (HOSPITAL_COMMUNITY): Payer: Medicare Other

## 2012-02-02 DIAGNOSIS — D72829 Elevated white blood cell count, unspecified: Secondary | ICD-10-CM | POA: Diagnosis present

## 2012-02-02 DIAGNOSIS — IMO0001 Reserved for inherently not codable concepts without codable children: Secondary | ICD-10-CM | POA: Diagnosis present

## 2012-02-02 DIAGNOSIS — F329 Major depressive disorder, single episode, unspecified: Secondary | ICD-10-CM | POA: Diagnosis present

## 2012-02-02 DIAGNOSIS — F17200 Nicotine dependence, unspecified, uncomplicated: Secondary | ICD-10-CM | POA: Diagnosis present

## 2012-02-02 DIAGNOSIS — R51 Headache: Secondary | ICD-10-CM | POA: Diagnosis present

## 2012-02-02 DIAGNOSIS — Z7982 Long term (current) use of aspirin: Secondary | ICD-10-CM

## 2012-02-02 DIAGNOSIS — Z794 Long term (current) use of insulin: Secondary | ICD-10-CM

## 2012-02-02 DIAGNOSIS — N764 Abscess of vulva: Secondary | ICD-10-CM | POA: Diagnosis present

## 2012-02-02 DIAGNOSIS — R6510 Systemic inflammatory response syndrome (SIRS) of non-infectious origin without acute organ dysfunction: Secondary | ICD-10-CM

## 2012-02-02 DIAGNOSIS — A419 Sepsis, unspecified organism: Principal | ICD-10-CM | POA: Diagnosis present

## 2012-02-02 DIAGNOSIS — Z9119 Patient's noncompliance with other medical treatment and regimen: Secondary | ICD-10-CM

## 2012-02-02 DIAGNOSIS — N12 Tubulo-interstitial nephritis, not specified as acute or chronic: Secondary | ICD-10-CM

## 2012-02-02 DIAGNOSIS — F3289 Other specified depressive episodes: Secondary | ICD-10-CM | POA: Diagnosis present

## 2012-02-02 DIAGNOSIS — N189 Chronic kidney disease, unspecified: Secondary | ICD-10-CM

## 2012-02-02 DIAGNOSIS — Z91199 Patient's noncompliance with other medical treatment and regimen due to unspecified reason: Secondary | ICD-10-CM

## 2012-02-02 DIAGNOSIS — I509 Heart failure, unspecified: Secondary | ICD-10-CM | POA: Diagnosis present

## 2012-02-02 DIAGNOSIS — I129 Hypertensive chronic kidney disease with stage 1 through stage 4 chronic kidney disease, or unspecified chronic kidney disease: Secondary | ICD-10-CM | POA: Diagnosis present

## 2012-02-02 DIAGNOSIS — E1165 Type 2 diabetes mellitus with hyperglycemia: Secondary | ICD-10-CM | POA: Diagnosis present

## 2012-02-02 DIAGNOSIS — N183 Chronic kidney disease, stage 3 unspecified: Secondary | ICD-10-CM | POA: Diagnosis present

## 2012-02-02 DIAGNOSIS — I5032 Chronic diastolic (congestive) heart failure: Secondary | ICD-10-CM | POA: Diagnosis present

## 2012-02-02 DIAGNOSIS — J449 Chronic obstructive pulmonary disease, unspecified: Secondary | ICD-10-CM | POA: Diagnosis present

## 2012-02-02 DIAGNOSIS — J4489 Other specified chronic obstructive pulmonary disease: Secondary | ICD-10-CM | POA: Diagnosis present

## 2012-02-02 DIAGNOSIS — N390 Urinary tract infection, site not specified: Secondary | ICD-10-CM | POA: Diagnosis present

## 2012-02-02 DIAGNOSIS — G4733 Obstructive sleep apnea (adult) (pediatric): Secondary | ICD-10-CM | POA: Diagnosis present

## 2012-02-02 DIAGNOSIS — F1910 Other psychoactive substance abuse, uncomplicated: Secondary | ICD-10-CM | POA: Diagnosis present

## 2012-02-02 DIAGNOSIS — N179 Acute kidney failure, unspecified: Secondary | ICD-10-CM | POA: Diagnosis present

## 2012-02-02 DIAGNOSIS — Z6841 Body Mass Index (BMI) 40.0 and over, adult: Secondary | ICD-10-CM

## 2012-02-02 DIAGNOSIS — E46 Unspecified protein-calorie malnutrition: Secondary | ICD-10-CM | POA: Diagnosis present

## 2012-02-02 DIAGNOSIS — E118 Type 2 diabetes mellitus with unspecified complications: Secondary | ICD-10-CM | POA: Diagnosis present

## 2012-02-02 DIAGNOSIS — I10 Essential (primary) hypertension: Secondary | ICD-10-CM | POA: Diagnosis present

## 2012-02-02 DIAGNOSIS — Z720 Tobacco use: Secondary | ICD-10-CM | POA: Diagnosis present

## 2012-02-02 LAB — RAPID URINE DRUG SCREEN, HOSP PERFORMED
Amphetamines: NOT DETECTED
Benzodiazepines: NOT DETECTED
Cocaine: NOT DETECTED
Opiates: NOT DETECTED

## 2012-02-02 LAB — LIPASE, BLOOD: Lipase: 9 U/L — ABNORMAL LOW (ref 11–59)

## 2012-02-02 LAB — CSF CELL COUNT WITH DIFFERENTIAL
RBC Count, CSF: 543 /mm3 — ABNORMAL HIGH
Tube #: 1

## 2012-02-02 LAB — BLOOD GAS, VENOUS
Acid-base deficit: 1.8 mmol/L (ref 0.0–2.0)
Bicarbonate: 22.9 mEq/L (ref 20.0–24.0)
O2 Saturation: 93.3 %
TCO2: 20.7 mmol/L (ref 0–100)
pCO2, Ven: 40.8 mmHg — ABNORMAL LOW (ref 45.0–50.0)
pO2, Ven: 70.4 mmHg — ABNORMAL HIGH (ref 30.0–45.0)

## 2012-02-02 LAB — CBC WITH DIFFERENTIAL/PLATELET
Eosinophils Relative: 0 % (ref 0–5)
HCT: 37.4 % (ref 36.0–46.0)
Lymphocytes Relative: 7 % — ABNORMAL LOW (ref 12–46)
Lymphs Abs: 0.9 10*3/uL (ref 0.7–4.0)
MCV: 87.8 fL (ref 78.0–100.0)
Monocytes Absolute: 1 10*3/uL (ref 0.1–1.0)
RBC: 4.26 MIL/uL (ref 3.87–5.11)
WBC: 12.6 10*3/uL — ABNORMAL HIGH (ref 4.0–10.5)

## 2012-02-02 LAB — COMPREHENSIVE METABOLIC PANEL
BUN: 18 mg/dL (ref 6–23)
CO2: 24 mEq/L (ref 19–32)
Calcium: 9.1 mg/dL (ref 8.4–10.5)
Creatinine, Ser: 1.73 mg/dL — ABNORMAL HIGH (ref 0.50–1.10)
GFR calc Af Amer: 43 mL/min — ABNORMAL LOW (ref >90)
GFR calc non Af Amer: 37 mL/min — ABNORMAL LOW (ref >90)
Glucose, Bld: 209 mg/dL — ABNORMAL HIGH (ref 70–99)

## 2012-02-02 LAB — URINALYSIS, ROUTINE W REFLEX MICROSCOPIC
Protein, ur: 100 mg/dL — AB
Urobilinogen, UA: 2 mg/dL — ABNORMAL HIGH (ref 0.0–1.0)

## 2012-02-02 LAB — PREGNANCY, URINE: Preg Test, Ur: NEGATIVE

## 2012-02-02 LAB — PROCALCITONIN: Procalcitonin: 16.54 ng/mL

## 2012-02-02 LAB — GLUCOSE, CAPILLARY: Glucose-Capillary: 185 mg/dL — ABNORMAL HIGH (ref 70–99)

## 2012-02-02 LAB — URINE MICROSCOPIC-ADD ON

## 2012-02-02 LAB — PROTEIN AND GLUCOSE, CSF: Total  Protein, CSF: 40 mg/dL (ref 15–45)

## 2012-02-02 MED ORDER — VANCOMYCIN HCL 1000 MG IV SOLR
2000.0000 mg | INTRAVENOUS | Status: DC
  Administered 2012-02-03 – 2012-02-04 (×2): 2000 mg via INTRAVENOUS
  Filled 2012-02-02 (×3): qty 2000

## 2012-02-02 MED ORDER — CLONIDINE HCL 0.1 MG PO TABS
0.1000 mg | ORAL_TABLET | Freq: Two times a day (BID) | ORAL | Status: DC
  Administered 2012-02-02 – 2012-02-03 (×3): 0.1 mg via ORAL
  Filled 2012-02-02 (×5): qty 1

## 2012-02-02 MED ORDER — SODIUM CHLORIDE 0.9 % IV SOLN: INTRAVENOUS | Status: DC

## 2012-02-02 MED ORDER — SODIUM CHLORIDE 0.9 % IV SOLN: 2000.0000 mg | INTRAVENOUS | Status: DC

## 2012-02-02 MED ORDER — SODIUM CHLORIDE 0.9 % IV SOLN
INTRAVENOUS | Status: AC
  Administered 2012-02-02: 12:00:00 via INTRAVENOUS

## 2012-02-02 MED ORDER — HYDROMORPHONE HCL PF 1 MG/ML IJ SOLN
1.0000 mg | Freq: Once | INTRAMUSCULAR | Status: AC
  Administered 2012-02-02: 1 mg via INTRAVENOUS
  Filled 2012-02-02: qty 1

## 2012-02-02 MED ORDER — INSULIN ASPART 100 UNIT/ML ~~LOC~~ SOLN: 0.0000 [IU] | Freq: Three times a day (TID) | SUBCUTANEOUS | Status: DC

## 2012-02-02 MED ORDER — DEXTROSE 5 % IV SOLN: 1.0000 g | Freq: Once | INTRAVENOUS | Status: DC

## 2012-02-02 MED ORDER — ONDANSETRON HCL 4 MG/2ML IJ SOLN
INTRAMUSCULAR | Status: AC
  Administered 2012-02-02: 09:00:00
  Filled 2012-02-02: qty 2

## 2012-02-02 MED ORDER — METOCLOPRAMIDE HCL 10 MG PO TABS: 10.0000 mg | ORAL_TABLET | Freq: Four times a day (QID) | ORAL | Status: DC | PRN

## 2012-02-02 MED ORDER — ACETAMINOPHEN 325 MG PO TABS
650.0000 mg | ORAL_TABLET | Freq: Once | ORAL | Status: AC
  Administered 2012-02-02: 650 mg via ORAL
  Filled 2012-02-02: qty 2

## 2012-02-02 MED ORDER — PANTOPRAZOLE SODIUM 40 MG PO TBEC
40.0000 mg | DELAYED_RELEASE_TABLET | Freq: Every day | ORAL | Status: DC
  Administered 2012-02-02 – 2012-02-05 (×4): 40 mg via ORAL
  Filled 2012-02-02 (×4): qty 1

## 2012-02-02 MED ORDER — ISOSORBIDE MONONITRATE ER 30 MG PO TB24
30.0000 mg | ORAL_TABLET | Freq: Every day | ORAL | Status: DC
  Administered 2012-02-02 – 2012-02-05 (×4): 30 mg via ORAL
  Filled 2012-02-02 (×4): qty 1

## 2012-02-02 MED ORDER — OXYCODONE-ACETAMINOPHEN 5-325 MG PO TABS
1.0000 | ORAL_TABLET | Freq: Once | ORAL | Status: AC
  Administered 2012-02-02: 1 via ORAL
  Filled 2012-02-02: qty 1

## 2012-02-02 MED ORDER — OXYCODONE-ACETAMINOPHEN 5-325 MG PO TABS
1.0000 | ORAL_TABLET | ORAL | Status: DC | PRN
  Administered 2012-02-02 – 2012-02-05 (×2): 1 via ORAL
  Filled 2012-02-02 (×3): qty 1

## 2012-02-02 MED ORDER — SODIUM CHLORIDE 0.9 % IV BOLUS (SEPSIS)
500.0000 mL | Freq: Once | INTRAVENOUS | Status: AC
  Administered 2012-02-02: 500 mL via INTRAVENOUS

## 2012-02-02 MED ORDER — ONDANSETRON HCL 4 MG/2ML IJ SOLN: 4.0000 mg | Freq: Three times a day (TID) | INTRAMUSCULAR | Status: AC | PRN

## 2012-02-02 MED ORDER — VANCOMYCIN HCL 1000 MG IV SOLR
2500.0000 mg | INTRAVENOUS | Status: AC
  Administered 2012-02-02: 2500 mg via INTRAVENOUS
  Filled 2012-02-02: qty 2500

## 2012-02-02 MED ORDER — MORPHINE SULFATE 2 MG/ML IJ SOLN
1.0000 mg | INTRAMUSCULAR | Status: DC | PRN
  Administered 2012-02-02 – 2012-02-05 (×13): 1 mg via INTRAVENOUS
  Filled 2012-02-02 (×14): qty 1

## 2012-02-02 MED ORDER — DILTIAZEM HCL ER COATED BEADS 120 MG PO CP24
120.0000 mg | ORAL_CAPSULE | Freq: Every day | ORAL | Status: DC
  Administered 2012-02-02 – 2012-02-04 (×3): 120 mg via ORAL
  Filled 2012-02-02 (×3): qty 1

## 2012-02-02 MED ORDER — INSULIN ASPART 100 UNIT/ML ~~LOC~~ SOLN
0.0000 [IU] | Freq: Three times a day (TID) | SUBCUTANEOUS | Status: DC
  Administered 2012-02-03 – 2012-02-04 (×5): 3 [IU] via SUBCUTANEOUS
  Administered 2012-02-04: 2 [IU] via SUBCUTANEOUS
  Administered 2012-02-04 (×2): 3 [IU] via SUBCUTANEOUS
  Administered 2012-02-05: 5 [IU] via SUBCUTANEOUS
  Administered 2012-02-05: 3 [IU] via SUBCUTANEOUS

## 2012-02-02 MED ORDER — TRAMADOL-ACETAMINOPHEN 37.5-325 MG PO TABS
2.0000 | ORAL_TABLET | Freq: Four times a day (QID) | ORAL | Status: DC | PRN
  Administered 2012-02-02: 2 via ORAL
  Filled 2012-02-02: qty 2

## 2012-02-02 MED ORDER — IOHEXOL 300 MG/ML  SOLN
100.0000 mL | Freq: Once | INTRAMUSCULAR | Status: AC | PRN
  Administered 2012-02-02: 80 mL via INTRAVENOUS

## 2012-02-02 MED ORDER — SERTRALINE HCL 25 MG PO TABS
25.0000 mg | ORAL_TABLET | Freq: Every day | ORAL | Status: DC
  Administered 2012-02-02 – 2012-02-04 (×3): 25 mg via ORAL
  Filled 2012-02-02 (×4): qty 1

## 2012-02-02 MED ORDER — LORAZEPAM 1 MG PO TABS
2.0000 mg | ORAL_TABLET | Freq: Four times a day (QID) | ORAL | Status: DC | PRN
  Administered 2012-02-03 – 2012-02-05 (×3): 2 mg via ORAL
  Filled 2012-02-02: qty 1
  Filled 2012-02-02 (×2): qty 2
  Filled 2012-02-02: qty 1

## 2012-02-02 MED ORDER — ALBUTEROL SULFATE HFA 108 (90 BASE) MCG/ACT IN AERS
1.0000 | INHALATION_SPRAY | RESPIRATORY_TRACT | Status: DC
  Administered 2012-02-02 – 2012-02-03 (×7): 2 via RESPIRATORY_TRACT
  Administered 2012-02-04: 1 via RESPIRATORY_TRACT
  Administered 2012-02-04 – 2012-02-05 (×6): 2 via RESPIRATORY_TRACT
  Filled 2012-02-02: qty 6.7

## 2012-02-02 MED ORDER — INSULIN GLARGINE 100 UNIT/ML ~~LOC~~ SOLN
40.0000 [IU] | Freq: Every day | SUBCUTANEOUS | Status: DC
  Administered 2012-02-03 – 2012-02-04 (×2): 40 [IU] via SUBCUTANEOUS

## 2012-02-02 MED ORDER — VANCOMYCIN HCL 1000 MG IV SOLR: 2000.0000 mg | INTRAVENOUS | Status: DC

## 2012-02-02 MED ORDER — INSULIN ASPART 100 UNIT/ML ~~LOC~~ SOLN
0.0000 [IU] | Freq: Once | SUBCUTANEOUS | Status: AC
  Administered 2012-02-02: 3 [IU] via SUBCUTANEOUS

## 2012-02-02 MED ORDER — HYDRALAZINE HCL 20 MG/ML IJ SOLN
20.0000 mg | INTRAMUSCULAR | Status: DC | PRN
  Administered 2012-02-04 (×3): 20 mg via INTRAVENOUS
  Filled 2012-02-02 (×3): qty 1

## 2012-02-02 MED ORDER — DEXTROSE 5 % IV SOLN
1.0000 g | Freq: Once | INTRAVENOUS | Status: AC
  Administered 2012-02-02: 1 g via INTRAVENOUS
  Filled 2012-02-02: qty 10

## 2012-02-02 MED ORDER — POTASSIUM CHLORIDE 10 MEQ/100ML IV SOLN
10.0000 meq | INTRAVENOUS | Status: AC
  Administered 2012-02-02 (×4): 10 meq via INTRAVENOUS
  Filled 2012-02-02: qty 100
  Filled 2012-02-02: qty 200
  Filled 2012-02-02: qty 100

## 2012-02-02 NOTE — Progress Notes (Signed)
Katrena Stehlin, is a 36 y.o. female,   MRN: [id number]  -  DOB - [birth year]  Outpatient Primary MD for the patient is Lonia Blood, MD  in for    Chief Complaint  Patient presents with  . Headache  . Nausea     Blood pressure 198/98, pulse 113, temperature 101.2 F (38.4 C), temperature source Oral, resp. rate 20, last menstrual period 01/24/2012, SpO2 100.00%.  Principal Problem:  *SIRS (systemic inflammatory response syndrome) Active Problems:    Diabetes mellitus type 2 with complications, uncontrolled  Hypertension, uncontrolled  Morbid obesity with BMI of 50.0-59.9, adult  Polysubstance abuse  COPD (chronic obstructive pulmonary disease)  Tobacco use  Obstructive sleep apnea  Chronic diastolic heart failure, NYHA class 2   Acute on chronic KD (chronic kidney disease) stage 3, GFR 30-59 ml/min  Depression  UTI (lower urinary tract infection)  36 yo with above hx presents with cc nausea, vomiting and diarrhea for 2 days. Seen 2 days ago for same. Hydrated, and discharged with meds. Reports worsening symptoms with fever, chills, cough and "side pain". Denies dysuria, hematuria. Reports loose stool denies melena or BRBPR. Has been unable to keep meds down   Work up yields UTI, leukocytosis, fever, tachycardia. Creatinine 1.7 and chart indicates baseline 1.5. Chest xray Stable cardiomegaly. No acute abnormality.  Urine with leuks, nitrites, 21-50 WBC, 7-10 RBC, many bacteria and was a cath specimen.   Given IV fluids, rocephin, zofran, tylenol, dilaudid, percocet, and 4 runs of K.  On exam, pt sitting up crying complaining of "side pain". respers non-labored but at 22/min.   Will admit to stepdown

## 2012-02-02 NOTE — Progress Notes (Signed)
Attempted Abg Pt refused after 2 sticks

## 2012-02-02 NOTE — Progress Notes (Signed)
[facility] follow-up progress note (same day)  VBG shows no acute respiratory acidosis (unreliable as VBG) Will consider doing dedicated ABG depending on clinical picture once she comes to the floor  Pleas Koch, MD [facility] ([phone]

## 2012-02-02 NOTE — ED Notes (Signed)
Admitting MD at pts bedside.

## 2012-02-02 NOTE — Progress Notes (Signed)
[facility] follow-up progress note (same day)   CSF glucose and protein reviewed-Gram stain pending, cell ount pending. Await the same prior to dopsing night dose of Empiric Cetriaxone 2 g q12, Vancomycin for Potnetnial meningitia- Nursing reports increased O2 requirement-needs face mask O2  BP 198/98  Pulse 98  Temp 101.2 F (38.4 C) (Oral)  Resp 24  Ht 5' 5.35" (1.66 m)  Wt 155.6 kg (343 lb 0.6 oz)  BMI 56.47 kg/m2  SpO2 99%  LMP 01/24/2012 Mentating well, Still mild Ha.   No N/v/SOb, no further diarrhoea  A/pHtn uyrgency-start Nitro paste-off load preload on heart and potent vasodilator--COuld paradoxically cause Headache as well jhowever Await CT abd pelvis Await cultures/Gram stain Change rocephin to 1 g q 12.  She looks better already.  Suspect at this stage UTI>> Meningitis picture   Pleas Koch, MD [facility] [phone]

## 2012-02-02 NOTE — Progress Notes (Signed)
Cross coverage Patient has been weaned down to [place].  Oxygenation desats while sleeping similar to OSA.  Abx adjusted as does not appear to be meningitis (WBCs 1), more likely .  Gram stain and culture pending.  Urine cx pending.  Marlin Canary DO

## 2012-02-02 NOTE — Progress Notes (Deleted)
Pulled ETT back to 21cm per radiologist. (23cm is 1 cm above the carina).

## 2012-02-02 NOTE — H&P (Signed)
[facility] History and Physical  Debra Stokes AVW:[id number] DOB: 03-04-1976 DOA: 02/02/2012  Referring physician: Charlann Boxer, MD PCP: Debra Blood, MD   Chief Complaint: Fever and Chills  HPI: Debra Stokes is a 36 y.o. female  Who presented initially to [facility] 9/18 to the ED for "feeling ill" was hurting, aching and "throwing up"-she states that the vomiting was the first thing to occur.  No Stokes in it, was just clear.  No sick contacts.  She also states that diarrhoea started the 9/17 pm.  Had maybe 10 BM's and "kept going to RR"  She would go and only a little would come out and it would then be runny. Went hoem from ED and developed a dry cough and still couldn;t eat Has been taking ice and ginger ale and sprite and water  She states she has had achyness and soreness to both sides and flanks-at bedside patient has fever and chills and was profusely sweating. She is tearful and has occasional increased work of breathing Has a h/o in the head with a knonw h/o migraines-she states the pain in the head "hurts all the way around" The last time she had n/v was on going to Xray this am.  This was a little bit but was clear.  This has never happened before-She has never had such a severe headache in the past. She states that the headache is the more severe that she's ever had in 11/10 in intensity and she has photophobia. It is noted that patient has a history of migraine headaches in the past medical history as well.  Review of Systems:   Positive for fever positive for chills positive for abdominal pain positive for headache positive for chest pain positive for diarrhea positive for nausea negative for burning in the urine negative for falls negative for dark stool or tarry stool negative for dark emesis negative for blurred or double vision negative for rash negative for vaginal discharge  Chart review  Admission 05/28/2010 with acute diastolic congestive heart failure-restrictive  pathology-systolic function potentially mildly reduced-consider repeating echo in future with Definity contrast  Admission 10/01/2010 for hypertensive urgency and encephalopathy with a history of congestive heart failure noted at that time to have gout and asthma as well as well as morbid obesity  Admission 12/03/2010 with noncardiac chest pain-CT angiogram of chest that time showed no pulmonary embolism  ? History migraines  ? History of asthma  Noncompliance with medical therapy is noted  Admission 03/07/2011 for acute bronchitis, malignant hypertension, suspected obstructive sleep apnea, BMI greater than 40, insulin dependent diabetes mellitus2  Admission 10/28/2011 for vulvar abscess requiring IV antibiotics and pain management-this is a medical consult for diabetes mellitus  Admission 11/25/2011 for acute CHF exacerbation with chronic renal insufficiency and COPD/asthma?? Echocardiogram done 11/27/2011 showed an EF of 60%, diastolic dysfunction grade 2, PA peak pressure 40 mm mercury  Admission 12/19/2011 for acute diastolic CHF and hypertensive emergency with a UDS positive for cocaine-A1c at that 10.8  Recent admission 9/813 with acute on chronic diastolic dysfunction NYHA class II, stage IIIc daily acute respiratory failure with hypoxia-required BiPAP during that hospitalization transiently  Allegedly had a negative cardiac catheterization April 2013 at [facility]    Past Medical History  Diagnosis Date  . Diabetes mellitus   . Asthma   . CHF (congestive heart failure)   . Chronic back pain   . Migraine headache   . Hypertension   . Morbid obesity   . Polysubstance abuse   .  Sleep apnea   . Drug-seeking behavior   . Urinary tract infection   . Arthritis    Past Surgical History  Procedure Date  . Cholecystectomy   . Cardiac catheterization     two, no blockage   Social History:  reports that she has been smoking Cigarettes.  She has a 8 pack-year smoking  history. She has never used smokeless tobacco. She reports that she does not drink alcohol or use illicit drugs.  History   Social History  . Marital Status: Single    Spouse Name: N/A    Number of Children: N/A  . Years of Education: N/A   Occupational History  . Not on file.   Social History Main Topics  . Smoking status: Current Every Day Smoker -- 0.5 packs/day for 16 years    Types: Cigarettes  . Smokeless tobacco: Never Used  . Alcohol Use: No     quit 5 yrs ago  . Drug Use: No  . Sexually Active: Yes    Birth Control/ Protection: None   Other Topics Concern  . Not on file   Social History Narrative   Lives with her GF and their 2 boys-one in 4th grade, 6th gradeHas been with her since early 2013.     No Known Allergies  Family History  Problem Relation Age of Onset  . Other Neg Hx   Not known-asekd  Prior to Admission medications   Medication Sig Start Date End Date Taking? Authorizing Provider  acetaminophen (TYLENOL) 325 MG tablet Take 650 mg by mouth every 6 (six) hours as needed. Pain/fever.   Yes Historical Provider, MD  albuterol (PROVENTIL HFA;VENTOLIN HFA) 108 (90 BASE) MCG/ACT inhaler Inhale 2 puffs into the lungs every 6 (six) hours as needed. For asthma attacks.   Yes Historical Provider, MD  aspirin 81 MG chewable tablet Chew 81 mg by mouth daily.   Yes Historical Provider, MD  cloNIDine (CATAPRES) 0.1 MG tablet Take 0.1 mg by mouth 2 (two) times daily.   Yes Historical Provider, MD  diltiazem (CARDIZEM CD) 120 MG 24 hr capsule Take 120 mg by mouth daily.   Yes Historical Provider, MD  exenatide (BYETTA) 5 MCG/0.02ML SOLN Inject 5 mcg into the skin every morning. 11/04/11  Yes Peggy Constant, MD  furosemide (LASIX) 20 MG tablet Take 2 tablets (40 mg total) by mouth 2 (two) times daily. 01/24/12  Yes Sorin Luanne Bras, MD  hydrALAZINE (APRESOLINE) 25 MG tablet Take 50 mg by mouth every 8 (eight) hours.   Yes Historical Provider, MD  insulin aspart (NOVOLOG)  100 UNIT/ML injection Inject 0-20 Units into the skin 4 (four) times daily -  with meals and at bedtime. Sliding scale. 11/04/11 11/03/12 Yes Peggy Constant, MD  insulin glargine (LANTUS) 100 UNIT/ML injection Inject 40 Units into the skin at bedtime.    Yes Historical Provider, MD  isosorbide mononitrate (IMDUR) 30 MG 24 hr tablet Take 1 tablet (30 mg total) by mouth daily. 01/24/12 01/23/13 Yes Sorin Luanne Bras, MD  LORazepam (ATIVAN) 2 MG tablet Take 2 mg by mouth every 6 (six) hours as needed. For anxiety.   Yes Historical Provider, MD  metoCLOPramide (REGLAN) 10 MG tablet Take 10 mg by mouth every 6 (six) hours as needed. 01/31/12  Yes Dione Booze, MD  oxyCODONE-acetaminophen (PERCOCET/ROXICET) 5-325 MG per tablet Take 1 tablet by mouth every 4 (four) hours as needed. 01/31/12  Yes Dione Booze, MD  potassium chloride SA (K-DUR,KLOR-CON) 20 MEQ tablet Take 40  mEq by mouth daily.   Yes Historical Provider, MD  sertraline (ZOLOFT) 25 MG tablet Take 1 tablet (25 mg total) by mouth daily. 01/24/12 01/23/13 Yes Sorin Luanne Bras, MD   Physical Exam: Filed Vitals:   02/02/12 0905 02/02/12 0907 02/02/12 1021  BP:  198/98   Pulse:  113   Temp:  102.2 F (39 C) 101.2 F (38.4 C)  TempSrc:  Oral Oral  Resp:  20   SpO2: 98% 100%      General:  Diaphoretic ill-appearing African American female Morbid obesity, There is no height or weight on file to calculate BMI.  Eyes: Cannot appreciate fundus, no icterus however has some photophobia  ENT: Thick neck Mallampati stage III, no erythema  Neck: No JVD no carotid bruit no thyromegaly  Cardiovascular: S1-S2 moderately tachycardic  Respiratory: Clinically clear no added sound  Abdomen: Soft but tender in the flanks bilaterally-CVA tenderness noted bilaterally  Skin: Rash over the upper back which appears to be old pimple rashes  Musculoskeletal: Moves all 4 limbs equally with no effusion or deficit.  Psychiatric: Anxious anxious and hyperventilating at  bedside  Neurologic: Motor grossly intact-reflexes 2/3. Power 5/5. Vision by direct confrontation is normal. Uvula is midline. No slurred speech no twisting of mouth to either side.  Kernig's and Brudzinski's tests are grossly equivocal  Labs on Admission:  Basic Metabolic Panel:  Lab 02/02/12 4098 01/31/12 0928  NA 132* 133*  K 3.2* 3.0*  CL 97 95*  CO2 24 20  GLUCOSE 209* 303*  BUN 18 18  CREATININE 1.73* 1.50*  CALCIUM 9.1 9.1  MG -- --  PHOS -- --   Liver Function Tests:  Lab 02/02/12 0940  AST 9  ALT 10  ALKPHOS 82  BILITOT 1.6*  PROT 7.4  ALBUMIN 2.9*    Lab 02/02/12 0940  LIPASE 9*  AMYLASE --   No results found for this basename: AMMONIA:5 in the last 168 hours CBC:  Lab 02/02/12 0940 01/31/12 0928  WBC 12.6* 11.8*  NEUTROABS 10.7* 10.9*  HGB 12.5 12.9  HCT 37.4 39.3  MCV 87.8 89.5  PLT 201 265   Cardiac Enzymes: No results found for this basename: CKTOTAL:5,CKMB:5,CKMBINDEX:5,TROPONINI:5 in the last 168 hours  BNP (last 3 results)  Basename 01/21/12 1740 01/03/12 2020 12/19/11 1310  PROBNP 2518.0* 2112.0* 2539.0*   CBG:  Lab 02/02/12 0920 01/31/12 0905  GLUCAP 219* 322*    Radiological Exams on Admission: Dg Chest 2 View  02/02/2012  *RADIOLOGY REPORT*  Clinical Data: Headache, nausea, vomiting and diarrhea.  Morbid obesity.  CHEST - 2 VIEW  Comparison: 01/21/2012 and chest CTA dated 01/21/2012.  Findings: Stable enlarged cardiac silhouette.  Clear lungs with normal vascularity.  The visualized bones are unremarkable.  IMPRESSION: Stable cardiomegaly.  No acute abnormality.   Original Report Authenticated By: Darrol Angel, M.D.     EKG: Independently reviewed. Sinus tachycardia with a rate of about 110, PR interval =0.16 , QRS axis of about 15 degrees, ? RAE , boorderline criterion per ESTES for LVH, no q waves, ST-progression across precoridal leads occurs at V4. Compared c EKG done  Assessment/Plan Principal Problem:  *SIRS (systemic  inflammatory response syndrome) Active Problems:  Vulvar abscess  Diabetes mellitus type 2 with complications, uncontrolled  Hypertension, uncontrolled  Morbid obesity with BMI of 50.0-59.9, adult  Polysubstance abuse  COPD (chronic obstructive pulmonary disease)  Tobacco use  Obstructive sleep apnea  Chronic diastolic heart failure, NYHA class 2  CKD (chronic  kidney disease) stage 3, GFR 30-59 ml/min  Depression  UTI (lower urinary tract infection)  Acute on chronic renal failure   1. SIRS-probably secondary to urinary tract infection-urinary catheter specimen. Have to rule out meningitis and intra-abdominal process given her habitus it is impossible to get a meaningful exam either way. Appreciate ED physician getting lumbar puncture organized. Stat head CT and abdomen CT still pending. Lactic acid is 0.7 therefore unlikely severe sepsis. Await ABG. I am not sure if she had a viral illness antecedent to this on 01/31/2012 however we will treat her currently as a meningitis until proven otherwise by lumbar puncture and then taper antibiotics at that time 2. Hypertensive urgency-patient is on clonidine which is notorious for rebound hypertension if not given or taken regularly. We will attempt to wean her off of this over the next course of time and on to something more suitable that does not have this rebound side effect. For now we will keep her on her home medications of diltiazem 120 mg every 12 hourly, Monday and 2.1 g twice a day, hydralazine 25 mg 3 times a day will be held. Patient will need to take her Lasix 40 mg twice a day when she is able to take by mouth-although she seems euvolemic at this time she has history CHF and will need IV Lasix during hospitalization 3. Compensated congestive heart failure with recent EF 55-60%, grade 2 diastolic dysfunction-patient will need continue above medications in addition to IMdur 30 mg a 24 hourly in addition to Lasix which will be ordered as IV 40  mg daily. Continue aspirin 81 mg daily-Get report from [facility]:GNFAOZH Cath 4. Super morbid obesity-her BMI is about 50. She'll need followup with outpatient physician. She will need potential lap band versus bariatric referral. 5. Diabetes mellitus controlled on Byetta and insulin-last A1c was 10.1. Patient will need teaching and further reinforcing of care. 6. History of asthma,  Patient relates to me that she's been labeled as an asthmatic and has had asthma as a younger lady and was diagnosed as a child. Please note that she does not have a diagnosis of COPD 7. Chronic lower back pain with concha medicine history of substance abuse-cocaine: Patient had drug screen which was negative for cocaine but positive for marijuana. She'll need cautious implementation of controlled substances as an outpatient and this will be followed up by her primary care physician 8. Gout-quiescent 9. CKD stage 3 with AKI-Will cautiously hydrate-Hold Nehprotoxins (lasix) for now.  Stirt I/o and Daily weights 10. DVT-Heparin 11. GI-Protonix  Code Status: FULL-confirmed at bedisde Family Communication: None here Disposition Plan: SDU Team 3, [facility]   Time spent: 75 min  Mahala Menghini [facility] Pager [phone]  If 7PM-7AM, please contact night-coverage [email or web address] Password [facility] 02/02/2012, 10:56 AM

## 2012-02-02 NOTE — ED Notes (Signed)
ZOX:[id number]<AV> Expected date:02/02/12<BR> Expected time: 8:38 AM<BR> Means of arrival:Ambulance<BR> Comments:<BR> 35yoF, n/v

## 2012-02-02 NOTE — Progress Notes (Addendum)
ANTIBIOTIC CONSULT NOTE - INITIAL  Pharmacy Consult for Vancomycin Indication: r/o meningitis  No Known Allergies  Patient Measurements:    Vital Signs: Temp: 101.2 F (38.4 C) (09/20 1021) Temp src: Oral (09/20 1021) BP: 198/98 mmHg (09/20 0907) Pulse Rate: 98  (09/20 1145) Intake/Output from previous day:   Intake/Output from this shift: Total I/O In: 500 [I.V.:500] Out: -   Labs:  Basename 02/02/12 0940 01/31/12 0928  WBC 12.6* 11.8*  HGB 12.5 12.9  PLT 201 265  LABCREA -- --  CREATININE 1.73* 1.50*   The CrCl is unknown because both a height and weight (above a minimum accepted value) are required for this calculation. No results found for this basename: VANCOTROUGH:2,VANCOPEAK:2,VANCORANDOM:2,GENTTROUGH:2,GENTPEAK:2,GENTRANDOM:2,TOBRATROUGH:2,TOBRAPEAK:2,TOBRARND:2,AMIKACINPEAK:2,AMIKACINTROU:2,AMIKACIN:2, in the last 72 hours   Microbiology: Recent Results (from the past 720 hour(s))  MRSA PCR SCREENING     Status: Abnormal   Collection Time   01/21/12 11:42 PM      Component Value Range Status Comment   MRSA by PCR POSITIVE (*) NEGATIVE Final    Medical History: Past Medical History  Diagnosis Date  . Diabetes mellitus   . Asthma   . CHF (congestive heart failure)   . Chronic back pain   . Migraine headache   . Hypertension   . Morbid obesity   . Polysubstance abuse   . Sleep apnea   . Drug-seeking behavior   . Urinary tract infection   . Arthritis    Assessment:  47 yof with h/o polysubstance abuse presented 9/18 with c/o chills, generalized myalgias, N/V/D thought 2/2 gastroenteritits - discharged from ED - returned 9/20 with c/o headache, dark urine, diarrhea, fever.  Patient with h/o CKD (stage 3 - baseline CrCl 1.5).  UA c/w UTI, ceftriaxone started (on 1gm doses ordered x 2).  MD adding Vancomycin for r/o meningitis.  PCT 16.5.  Pending LP/cx, pending urine cx.  Scr 1.7, CrCl 69, normalized CrCl 51 ml/min  Goal of Therapy:  Vancomycin  trough level 15-20 mcg/ml  Plan:   Vancomycin 2500 mg IV x 1 as loading dose then 2000 mg q24h per protocol  Dr.Samtani aware and will f/u to scheduled Rocephin 2gm IV q12h if LP suspicious for meningitis  Pharmacy will f/u  Geoffry Paradise Thi 02/02/2012,12:17 PM

## 2012-02-02 NOTE — Procedures (Signed)
Clinical Data: Headache. Neck pain. Fever.  FLUOROSCOPIC GUIDED LUMBAR PUNCTURE  Comparison: None.  Fluoroscopy time: 1.03 minutes  Description: The procedure and risks were discussed with the patient, including the risks of bleeding, infection and CSF leak. Informed consent was obtained. Preliminary fluoroscopic survey of the lumbar spine demonstrated five non-rib bearing lumbar vertebrae. Using sterile technique, local anesthesia and fluoroscopic guidance, a 20 gauge spinal needle was inserted into the spinal canal at the L3-L4 level. 10 ml of clear, colorless CSF was withdrawn and sent to the laboratory for testing. An opening pressure of 39 cm of water was obtained and a closing pressure of 21 cm of water was obtained. The patient tolerated the procedure well with no immediate complications.  IMPRESSION: Successful fluoroscopic guided lumbar puncture, as described above.

## 2012-02-02 NOTE — ED Notes (Signed)
Per EMS pt c/o n/v x 2 days. Generalized pain, but chief complain HA. Pt states she has dark urine with diarrhea, no blood noted. Pt given 4 mg Zofran per EMS. EKG unremarkable, with sinus tachycardia. Pt told EMS she was at [facility] days ago, and is much worse today. Hot to touch. Temp 102.22. BP 168/124. Hx asthma, HTN, DM (195 BG), CHF.

## 2012-02-02 NOTE — ED Provider Notes (Addendum)
History     CSN: [id number]  Arrival date & time 02/02/12  4098   First MD Initiated Contact with Patient 02/02/12 [phone]      Chief Complaint  Patient presents with  . Headache  . Nausea    (Consider location/radiation/quality/duration/timing/severity/associated sxs/prior treatment) HPI Patient complaining of nausea, vomiting, and diarrhea for two days.  Seen here two days ago for same. Subjective fever and chills, some cough, diffuse abdominal cramping.  Denies uti symptosm.  Patient states now unable to keep meds down.  Patient is followed by Dr. Mikeal Hawthorne for primary care.  Diarrhea stool is loose but denies blood or mucous.   Past Medical History  Diagnosis Date  . Diabetes mellitus   . Asthma   . CHF (congestive heart failure)   . Chronic back pain   . Migraine headache   . Hypertension   . Morbid obesity   . Polysubstance abuse   . Sleep apnea   . Drug-seeking behavior   . Urinary tract infection   . Arthritis     Past Surgical History  Procedure Date  . Cholecystectomy   . Cardiac catheterization     two, no blockage    Family History  Problem Relation Age of Onset  . Other Neg Hx     History  Substance Use Topics  . Smoking status: Current Every Day Smoker -- 0.5 packs/day for 16 years    Types: Cigarettes  . Smokeless tobacco: Never Used  . Alcohol Use: No     quit 5 yrs ago    OB History    Grav Para Term Preterm Abortions TAB SAB Ect Mult Living   1    1  1    0      Review of Systems  Constitutional: Positive for fever, chills and fatigue. Negative for activity change, appetite change and unexpected weight change.  HENT: Negative for sore throat, rhinorrhea, neck pain, neck stiffness and sinus pressure.   Eyes: Negative for visual disturbance.  Respiratory: Positive for cough. Negative for shortness of breath.   Cardiovascular: Negative for chest pain and leg swelling.  Gastrointestinal: Positive for vomiting and diarrhea. Negative for  abdominal pain and blood in stool.  Genitourinary: Negative for dysuria, urgency, frequency, vaginal discharge and difficulty urinating.  Musculoskeletal: Negative for myalgias, arthralgias and gait problem.  Skin: Negative for color change and rash.  Neurological: Negative for weakness, light-headedness and headaches.  Hematological: Does not bruise/bleed easily.  Psychiatric/Behavioral: Negative for dysphoric mood.    Allergies  Review of patient's allergies indicates no known allergies.  Home Medications   Current Outpatient Rx  Name Route Sig Dispense Refill  . ACETAMINOPHEN 325 MG PO TABS Oral Take 650 mg by mouth every 6 (six) hours as needed. Pain/fever.    . ALBUTEROL SULFATE HFA 108 (90 BASE) MCG/ACT IN AERS Inhalation Inhale 2 puffs into the lungs every 6 (six) hours as needed. For asthma attacks.    . ASPIRIN 81 MG PO CHEW Oral Chew 81 mg by mouth daily.    Marland Kitchen CLONIDINE HCL 0.1 MG PO TABS Oral Take 0.1 mg by mouth 2 (two) times daily.    Marland Kitchen DILTIAZEM HCL ER COATED BEADS 120 MG PO CP24 Oral Take 120 mg by mouth daily.    Marland Kitchen EXENATIDE 5 MCG/0.02ML [place] SOLN Subcutaneous Inject 5 mcg into the skin every morning.    . FUROSEMIDE 20 MG PO TABS Oral Take 2 tablets (40 mg total) by mouth 2 (two) times daily.  60 tablet 0  . HYDRALAZINE HCL 25 MG PO TABS Oral Take 50 mg by mouth every 8 (eight) hours.    . INSULIN ASPART 100 UNIT/ML [place] SOLN Subcutaneous Inject 0-20 Units into the skin 4 (four) times daily -  with meals and at bedtime. Sliding scale.    . INSULIN GLARGINE 100 UNIT/ML [place] SOLN Subcutaneous Inject 40 Units into the skin at bedtime.     . ISOSORBIDE MONONITRATE ER 30 MG PO TB24 Oral Take 1 tablet (30 mg total) by mouth daily. 30 tablet 0  . LORAZEPAM 2 MG PO TABS Oral Take 2 mg by mouth every 6 (six) hours as needed. For anxiety.    Marland Kitchen METOCLOPRAMIDE HCL 10 MG PO TABS Oral Take 10 mg by mouth every 6 (six) hours as needed.    . OXYCODONE-ACETAMINOPHEN 5-325 MG PO TABS Oral Take 1  tablet by mouth every 4 (four) hours as needed.    Marland Kitchen POTASSIUM CHLORIDE CRYS ER 20 MEQ PO TBCR Oral Take 40 mEq by mouth daily.    . SERTRALINE HCL 25 MG PO TABS Oral Take 1 tablet (25 mg total) by mouth daily. 30 tablet 0    BP 198/98  Pulse 113  Temp 102.2 F (39 C) (Oral)  Resp 20  SpO2 100%  LMP 01/24/2012  Physical Exam  Nursing note and vitals reviewed. Constitutional: She is oriented to person, place, and time.       Morbidly obese, tachypneic, appears upset  HENT:  Head: Normocephalic and atraumatic.  Right Ear: External ear normal.  Left Ear: External ear normal.  Mouth/Throat: Oropharynx is clear and moist.  Eyes: Conjunctivae normal are normal. Pupils are equal, round, and reactive to light.  Neck: Normal range of motion. Neck supple.  Cardiovascular: Tachycardia present.   Pulmonary/Chest: Effort normal and breath sounds normal.  Abdominal: Soft. Bowel sounds are normal.  Musculoskeletal: Normal range of motion. She exhibits no edema and no tenderness.  Neurological: She is alert and oriented to person, place, and time.  Skin: Skin is warm and dry.  Psychiatric: She has a normal mood and affect.    ED Course  Procedures (including critical care time)  Labs Reviewed  CBC WITH DIFFERENTIAL - Abnormal; Notable for the following:    WBC 12.6 (*)     Neutrophils Relative 85 (*)     Neutro Abs 10.7 (*)     Lymphocytes Relative 7 (*)     All other components within normal limits  URINALYSIS, ROUTINE W REFLEX MICROSCOPIC - Abnormal; Notable for the following:    Color, Urine AMBER (*)  BIOCHEMICALS MAY BE AFFECTED BY COLOR   APPearance CLOUDY (*)     Hgb urine dipstick MODERATE (*)     Bilirubin Urine SMALL (*)     Protein, ur 100 (*)     Urobilinogen, UA 2.0 (*)     Nitrite POSITIVE (*)     Leukocytes, UA LARGE (*)     All other components within normal limits  GLUCOSE, CAPILLARY - Abnormal; Notable for the following:    Glucose-Capillary 219 (*)     All  other components within normal limits  URINE MICROSCOPIC-ADD ON - Abnormal; Notable for the following:    Bacteria, UA MANY (*)     Casts GRANULAR CAST (*)     All other components within normal limits  PREGNANCY, URINE  COMPREHENSIVE METABOLIC PANEL  LIPASE, BLOOD  URINE CULTURE   Dg Chest 2 View  02/02/2012  *  RADIOLOGY REPORT*  Clinical Data: Headache, nausea, vomiting and diarrhea.  Morbid obesity.  CHEST - 2 VIEW  Comparison: 01/21/2012 and chest CTA dated 01/21/2012.  Findings: Stable enlarged cardiac silhouette.  Clear lungs with normal vascularity.  The visualized bones are unremarkable.  IMPRESSION: Stable cardiomegaly.  No acute abnormality.   Original Report Authenticated By: Darrol Angel, M.D.      No diagnosis found.    MDM  Patient's labs c.w. uti and pyelo.  Patient with some decrease in temp after tylenol.  Hydration gentle due to history of chf.  Patient with hr continues at 108 and patient with sbp 160 Discussed care with  Toya Smothers on call for [facility] and patient will be placed in step down due to tachycardia with fever and volume depletion with history of chf.  Patient with elevated bp and hr cw hyperthermia but will assess closely for decompensation with sirs.   CRITICAL CARE Performed by: Hilario Quarry   Total critical care time:40  Critical care time was exclusive of separately billable procedures and treating other patients.  Critical care was necessary to treat or prevent imminent or life-threatening deterioration.  Critical care was time spent personally by me on the following activities: development of treatment plan with patient and/or surrogate as well as nursing, discussions with consultants, evaluation of patient's response to treatment, examination of patient, obtaining history from patient or surrogate, ordering and performing treatments and interventions, ordering and review of laboratory studies, ordering and review of radiographic studies, pulse  oximetry and re-evaluation of patient's condition.    Hilario Quarry, MD 02/02/12 1050     Hilario Quarry, MD 02/05/12 [phone]

## 2012-02-03 LAB — COMPREHENSIVE METABOLIC PANEL
ALT: 12 U/L (ref 0–35)
Alkaline Phosphatase: 103 U/L (ref 39–117)
BUN: 20 mg/dL (ref 6–23)
Chloride: 97 mEq/L (ref 96–112)
GFR calc Af Amer: 42 mL/min — ABNORMAL LOW (ref >90)
Glucose, Bld: 198 mg/dL — ABNORMAL HIGH (ref 70–99)
Potassium: 4.2 mEq/L (ref 3.5–5.1)
Sodium: 131 mEq/L — ABNORMAL LOW (ref 135–145)
Total Bilirubin: 0.7 mg/dL (ref 0.3–1.2)
Total Protein: 7.1 g/dL (ref 6.0–8.3)

## 2012-02-03 LAB — CBC
HCT: 34.6 % — ABNORMAL LOW (ref 36.0–46.0)
Hemoglobin: 11.1 g/dL — ABNORMAL LOW (ref 12.0–15.0)
RBC: 3.84 MIL/uL — ABNORMAL LOW (ref 3.87–5.11)
WBC: 11.4 10*3/uL — ABNORMAL HIGH (ref 4.0–10.5)

## 2012-02-03 LAB — GLUCOSE, CAPILLARY
Glucose-Capillary: 157 mg/dL — ABNORMAL HIGH (ref 70–99)
Glucose-Capillary: 163 mg/dL — ABNORMAL HIGH (ref 70–99)

## 2012-02-03 LAB — MRSA PCR SCREENING: MRSA by PCR: NEGATIVE

## 2012-02-03 MED ORDER — DEXTROSE 5 % IV SOLN
2.0000 g | INTRAVENOUS | Status: AC
  Administered 2012-02-03: 2 g via INTRAVENOUS
  Filled 2012-02-03: qty 2

## 2012-02-03 MED ORDER — SODIUM CHLORIDE 0.9 % IV BOLUS (SEPSIS)
500.0000 mL | Freq: Once | INTRAVENOUS | Status: AC
  Administered 2012-02-03: 500 mL via INTRAVENOUS

## 2012-02-03 MED ORDER — INSULIN ASPART 100 UNIT/ML ~~LOC~~ SOLN: 0.0000 [IU] | Freq: Once | SUBCUTANEOUS | Status: DC

## 2012-02-03 MED ORDER — OXYCODONE-ACETAMINOPHEN 5-325 MG PO TABS
1.0000 | ORAL_TABLET | Freq: Once | ORAL | Status: AC
  Administered 2012-02-03: 1 via ORAL

## 2012-02-03 MED ORDER — ACETAMINOPHEN 325 MG PO TABS: 650.0000 mg | ORAL_TABLET | Freq: Once | ORAL | Status: DC

## 2012-02-03 MED ORDER — CLONIDINE HCL 0.1 MG PO TABS
0.1000 mg | ORAL_TABLET | Freq: Every day | ORAL | Status: DC
  Administered 2012-02-04: 0.1 mg via ORAL
  Filled 2012-02-03: qty 1

## 2012-02-03 MED ORDER — DEXTROSE 5 % IV SOLN
2.0000 g | Freq: Two times a day (BID) | INTRAVENOUS | Status: DC
  Administered 2012-02-04 – 2012-02-05 (×4): 2 g via INTRAVENOUS
  Filled 2012-02-03 (×5): qty 2
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
## 2012-02-03 NOTE — Progress Notes (Signed)
ANTIBIOTIC CONSULT NOTE - INITIAL  Pharmacy Consult for Vancomycin (Now add Ceftriaxone) Indication: R/O Meningitis  No Known Allergies  Patient Measurements: Height: 5' 5.35" (166 cm) Weight: 343 lb 0.6 oz (155.6 kg) IBW/kg (Calculated) : 57.81   Vital Signs: Temp: 98.9 F (37.2 C) (09/21 0800) Temp src: Oral (09/21 0800) BP: 145/95 mmHg (09/21 0554) Pulse Rate: 86  (09/21 0554) Intake/Output from previous day: 09/20 0701 - 09/21 0700 In: 1400 [I.V.:1400] Out: 1575 [Urine:1575] Intake/Output from this shift:    Labs:  Basename 02/03/12 0350 02/02/12 0940  WBC 11.4* 12.6*  HGB 11.1* 12.5  PLT 195 201  LABCREA -- --  CREATININE 1.77* 1.73*   Estimated Creatinine Clearance: 67.9 ml/min (by C-G formula based on Cr of 1.77). No results found for this basename: VANCOTROUGH:2,VANCOPEAK:2,VANCORANDOM:2,GENTTROUGH:2,GENTPEAK:2,GENTRANDOM:2,TOBRATROUGH:2,TOBRAPEAK:2,TOBRARND:2,AMIKACINPEAK:2,AMIKACINTROU:2,AMIKACIN:2, in the last 72 hours   Microbiology: Recent Results (from the past 720 hour(s))  MRSA PCR SCREENING     Status: Abnormal   Collection Time   01/21/12 11:42 PM      Component Value Range Status Comment   MRSA by PCR POSITIVE (*) NEGATIVE Final   MRSA PCR SCREENING     Status: Normal   Collection Time   02/03/12 12:30 AM      Component Value Range Status Comment   MRSA by PCR NEGATIVE  NEGATIVE Final    Medical History: Past Medical History  Diagnosis Date  . Diabetes mellitus   . Asthma   . CHF (congestive heart failure)   . Chronic back pain   . Migraine headache   . Hypertension   . Morbid obesity   . Polysubstance abuse   . Sleep apnea   . Drug-seeking behavior   . Urinary tract infection   . Arthritis    Assessment:  25 yof with h/o polysubstance abuse presented 9/18 with c/o chills, generalized myalgias, N/V/D thought 2/2 gastroenteritits - discharged from ED - returned 9/20 with c/o headache, dark urine, diarrhea, fever.  Patient with h/o CKD  (stage 3 - baseline CrCl 1.5).  UA c/w UTI, ceftriaxone given (on 1gm doses ordered x 2 doses at 10am and 11pm on 9/20).  Vancomycin also started on 9/20 for r/o meningitis.  PCT 16.5.    Scr 1.7, CrCl 69, normalized CrCl 50 ml/min  Based on discussion [facility] MD had with ID MD, decision was made to continue Vanco and ceftriaxone for treatment of meningitis, and continue treatment until CSF is negative.  Goal of Therapy:  Vancomycin trough level 15-20 mcg/ml  Plan:   Start ceftriaxone 2g IV q12h for meningitis  Continue Vanco 2g IV q24h  Check vanco trough at steady state  Darrol Angel, PharmD Pager: [phone] 02/03/2012,12:09 PM

## 2012-02-03 NOTE — Progress Notes (Signed)
We receive her from ICU at this time in no distress.  She is awake, alert and oriented x 4 with clear speech.  PEARL at 4mm.  Her neck is supple.  She c/o unrelenting left-sided h/a "for about a week now".  We welcome her to [facility] [place] and assist her with orientation to all necessary equipment/signalling devices, etc., for which she thanks [place].

## 2012-02-03 NOTE — Progress Notes (Signed)
Pt to CT from 2038 to 2100, tol. Well. Oren Beckmann LavernRN

## 2012-02-04 ENCOUNTER — Inpatient Hospital Stay (HOSPITAL_COMMUNITY): Payer: Medicare Other

## 2012-02-04 LAB — COMPREHENSIVE METABOLIC PANEL
BUN: 21 mg/dL (ref 6–23)
CO2: 22 mEq/L (ref 19–32)
Calcium: 8.9 mg/dL (ref 8.4–10.5)
Chloride: 99 mEq/L (ref 96–112)
Creatinine, Ser: 1.65 mg/dL — ABNORMAL HIGH (ref 0.50–1.10)
GFR calc Af Amer: 46 mL/min — ABNORMAL LOW (ref >90)
GFR calc non Af Amer: 39 mL/min — ABNORMAL LOW (ref >90)
Total Bilirubin: 0.3 mg/dL (ref 0.3–1.2)

## 2012-02-04 LAB — CBC
HCT: 31.1 % — ABNORMAL LOW (ref 36.0–46.0)
MCH: 28.5 pg (ref 26.0–34.0)
MCV: 88.6 fL (ref 78.0–100.0)
RBC: 3.51 MIL/uL — ABNORMAL LOW (ref 3.87–5.11)
WBC: 8.8 10*3/uL (ref 4.0–10.5)

## 2012-02-04 LAB — GLUCOSE, CAPILLARY
Glucose-Capillary: 202 mg/dL — ABNORMAL HIGH (ref 70–99)
Glucose-Capillary: 163 mg/dL — ABNORMAL HIGH (ref 70–99)

## 2012-02-04 MED ORDER — NEBIVOLOL HCL 5 MG PO TABS
5.0000 mg | ORAL_TABLET | Freq: Every day | ORAL | Status: DC
  Administered 2012-02-04 – 2012-02-05 (×2): 5 mg via ORAL
  Filled 2012-02-04 (×2): qty 1

## 2012-02-04 MED ORDER — DILTIAZEM HCL ER COATED BEADS 180 MG PO CP24
180.0000 mg | ORAL_CAPSULE | Freq: Every day | ORAL | Status: DC
  Administered 2012-02-05: 180 mg via ORAL
  Filled 2012-02-04: qty 1

## 2012-02-04 MED ORDER — SUMATRIPTAN 20 MG/ACT NA SOLN
20.0000 mg | NASAL | Status: DC | PRN
  Administered 2012-02-04: 20 mg via NASAL
  Filled 2012-02-04: qty 1

## 2012-02-04 NOTE — Progress Notes (Signed)
Clinical Social Work Department BRIEF PSYCHOSOCIAL ASSESSMENT 02/04/2012  Patient:  [facility]     Account Number:  [id number]     Admit date:  02/02/2012  Clinical Social Worker:  Leron Croak, CLINICAL SOCIAL WORKER  Date/Time:  02/04/2012 05:11 PM  Referred by:  Physician  Date Referred:  02/02/2012 Referred for  Behavioral Health Issues  Substance Abuse   Other Referral:   Interview type:  Patient Other interview type:    PSYCHOSOCIAL DATA Living Status:  WITH MINOR CHILDREN Admitted from facility:   Level of care:   Primary support name:  Priscille Loveless Primary support relationship to patient:  PARENT Degree of support available:   Pt stated limited support in the area. Moved here a year ago    CURRENT CONCERNS Current Concerns  Substance Abuse   Other Concerns:   Pt was diagnosed with Bi-polar several years ago. Pt stated she has not seen a psychiatrist since moving into the area.    SOCIAL WORK ASSESSMENT / PLAN CSW met with the Pt at bedside. Pt was in pain and complaining about her head hurting. CSW spoke to Pt about the +Marijuana and Pt stated that she was using it recently for stress releif. Pt stated that she has "so much going on right now" and that she "just used it a few times." Pt admits to using Marijuana in the past as a coping mechanism for stress. Pt is concerned about her health. CSW encouraged to inquire about anything she does not understand, but also to engage in any treatment regimen that the MD feels is necessary.   Assessment/plan status:  Information/Referral to [facility] Other assessment/ plan:   MD spoke with CSW concerning Pt. MD feels that the Pt may be a canidate for SNF. MD will place the order if needed.   Information/referral to community resources:   CSW provided information for the Pt for substance abuse and also provcided Behavioral Health information to seek out treatment for her BiPolar Dx.    PATIENT'S/FAMILY'S  RESPONSE TO PLAN OF CARE: Pt was accepting of information and thankful for assistance.     Leron Croak, LCSWA [employer] [phone]
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
## 2012-02-05 LAB — URINE CULTURE: Special Requests: NORMAL

## 2012-02-05 LAB — BASIC METABOLIC PANEL
CO2: 25 mEq/L (ref 19–32)
Calcium: 8.9 mg/dL (ref 8.4–10.5)
Chloride: 102 mEq/L (ref 96–112)
Glucose, Bld: 161 mg/dL — ABNORMAL HIGH (ref 70–99)
Potassium: 3.6 mEq/L (ref 3.5–5.1)
Sodium: 137 mEq/L (ref 135–145)

## 2012-02-05 LAB — VDRL, CSF: VDRL Quant, CSF: NONREACTIVE

## 2012-02-05 LAB — GLUCOSE, CAPILLARY
Glucose-Capillary: 173 mg/dL — ABNORMAL HIGH (ref 70–99)
Glucose-Capillary: 155 mg/dL — ABNORMAL HIGH (ref 70–99)

## 2012-02-05 MED ORDER — CEPHALEXIN 500 MG PO CAPS
1000.0000 mg | ORAL_CAPSULE | Freq: Two times a day (BID) | ORAL | Status: DC
  Filled 2012-02-05: qty 2

## 2012-02-05 NOTE — Progress Notes (Signed)
Nutrition Brief Note  - Received consult for pt regarding diet education as pt with BMI of 57.2, however noted pt upset and stressed during visit stating "I really don't want to leave against medical advice, but if I don't get a doctor up here in a few minutes, I will". Notified RN who was in the process of calling MD with this information. Pt denied any nutrition educational needs. Pt states her nausea/vomiting has resolved. Nutrition signing off.   Levon Hedger MS, RD, LDN [phone] Pager [phone] After Hours Pager

## 2012-02-05 NOTE — Progress Notes (Signed)
Pt requested to leave AMA. Pt reported that she was requesting to leave due to issues with childcare also pt reported that if not for the issues surrounding her child she would not make this request. MD notified. And pt informed of risk associated with leaving AMA. Dorris Fetch, RN

## 2012-02-05 NOTE — Evaluation (Signed)
Physical Therapy Evaluation Patient Details Name: Debra Stokes MRN: [id number] DOB: [birth year] Today's Date: 02/05/2012 Time: 7829-5621 PT Time Calculation (min): 13 min  PT Assessment / Plan / Recommendation Clinical Impression  36 yo female admitted with SIRS. Pt states she has been mobilizing in room without assistance and completed her own ADLs. 1 x eval. Pt has questions about need for cane due to intermittent wobbling,wavering. NO LOB during session. Pt able to ambulate without holding onto objects and she was able to self correct when wobblng occurred. Explained to pt that she could purchase cane from  drug store/medical supply if she felt she needed one or if she became more unsteady. Pt will sign off. No follow-up PT needs at discharge.     PT Assessment  Patent does not need any further PT services    Follow Up Recommendations  No PT follow up    Barriers to Discharge        Equipment Recommendations  None recommended by PT    Recommendations for Other Services     Frequency      Precautions / Restrictions Precautions Precautions: None Restrictions Weight Bearing Restrictions: No   Pertinent Vitals/Pain 7/10 headache      Mobility  Bed Mobility Bed Mobility: Not assessed Details for Bed Mobility Assistance: [name] at start of session Transfers Transfers: Sit to Stand;Stand to Sit Sit to Stand: 7: Independent Stand to Sit: 7: Independent Ambulation/Gait Ambulation/Gait Assistance: 6: Modified independent (Device/Increase time) Ambulation Distance (Feet): 200 Feet Assistive device: None Ambulation/Gait Assistance Details: Noted intermittent wobbling, increased sway. Pt is aware and able to self-correct.  Gait Pattern: Step-through pattern    Exercises     PT Diagnosis:    PT Problem List:   PT Treatment Interventions:     PT Goals    Visit Information  Last PT Received On: 02/05/12 Assistance Needed: +1    Subjective Data  Subjective:  "I would be fine if it wasn't for this headache" Patient Stated Goal: Less pain. Home   Prior Functioning  Home Living Lives With: Significant other;Son Available Help at Discharge: Family Type of Home: Apartment Home Access: Level entry Home Layout: One level Bathroom Shower/Tub: Network engineer: None Prior Function Level of Independence: Needs assistance Needs Assistance: Bathing Bath: Minimal Able to Take Stairs?: No Driving: Yes Comments: stepping into shower Communication Communication: No difficulties    Cognition  Overall Cognitive Status: Appears within functional limits for tasks assessed/performed Arousal/Alertness: Awake/alert Orientation Level: Appears intact for tasks assessed Behavior During Session: [facility] for tasks performed    Extremity/Trunk Assessment Right Lower Extremity Assessment RLE ROM/Strength/Tone: [facility] for tasks assessed Left Lower Extremity Assessment LLE ROM/Strength/Tone: [facility] for tasks assessed Trunk Assessment Trunk Assessment: Normal   Balance    End of Session PT - End of Session Activity Tolerance: Patient tolerated treatment well Patient left: in bed;with call bell/phone within reach  Debra Stokes     Debra Stokes Alert [facility] 02/05/2012, 12:45 PM [phone]

## 2012-02-06 LAB — GLUCOSE, CAPILLARY: Glucose-Capillary: 205 mg/dL — ABNORMAL HIGH (ref 70–99)

## 2012-02-12 NOTE — Discharge Summary (Signed)
Physician Discharge Summary  Marice Guidone ZOX:[id number] DOB: 13-Jun-1975 DOA: 02/02/2012  PCP: Lonia Blood, MD  Admit date: 02/02/2012 Discharge date: 02/12/2012  Recommendations for Outpatient Follow-up:  LEFT AMA -NONE  Discharge Diagnoses:  Principal Problem:  *SIRS (systemic inflammatory response syndrome) Active Problems:  Vulvar abscess  Diabetes mellitus type 2 with complications, uncontrolled  Hypertension, uncontrolled  Morbid obesity with BMI of 50.0-59.9, adult  Polysubstance abuse  COPD (chronic obstructive pulmonary disease)  Tobacco use  Obstructive sleep apnea  Chronic diastolic heart failure, NYHA class 2  CKD (chronic kidney disease) stage 3, GFR 30-59 ml/min  Depression  UTI (lower urinary tract infection)  Acute on chronic renal failure   Discharge Condition: Gaurded-Patient LEFT AGAINST MEDICAL ADVICE  Diet recommendation: none  Filed Weights   02/02/12 1158 02/03/12 1336  Weight: 155.6 kg (343 lb 0.6 oz) 155.6 kg (343 lb 0.6 oz)    History of present illness:  HPI: Debra Stokes is a 36 y.o. female  Who presented initially to [facility] 9/18 to the ED for "feeling ill" was hurting, aching and "throwing up"-she states that the vomiting was the first thing to occur. No blood in it, was just clear. No sick contacts. She also states that diarrhoea started the 9/17 pm. Had maybe 10 BM's and "kept going to RR" She would go and only a little would come out and it would then be runny.  Went hoem from ED and developed a dry cough and still couldn;t eat  Has been taking ice and ginger ale and sprite and water  She states she has had achyness and soreness to both sides and flanks-at bedside patient has fever and chills and was profusely sweating.  She is tearful and has occasional increased work of breathing  Has a h/o in the head with a knonw h/o migraines-she states the pain in the head "hurts all the way around"  The last time she had n/v was on going to Xray this  am. This was a little bit but was clear. This has never happened before-She has never had such a severe headache in the past.  She states that the headache is the more severe that she's ever had in 11/10 in intensity and she has photophobia. It is noted that patient has a history of migraine headaches in the past medical history as well.  Review of Systems:  Positive for fever positive for chills positive for abdominal pain positive for headache positive for chest pain positive for diarrhea positive for nausea negative for burning in the urine negative for falls negative for dark stool or tarry stool negative for dark emesis negative for blurred or double vision negative for rash negative for vaginal discharge  Chart review  Admission 05/28/2010 with acute diastolic congestive heart failure-restrictive pathology-systolic function potentially mildly reduced-consider repeating echo in future with Definity contrast  Admission 10/01/2010 for hypertensive urgency and encephalopathy with a history of congestive heart failure noted at that time to have gout and asthma as well as well as morbid obesity  Admission 12/03/2010 with noncardiac chest pain-CT angiogram of chest that time showed no pulmonary embolism  ? History migraines  ? History of asthma  Noncompliance with medical therapy is noted  Admission 03/07/2011 for acute bronchitis, malignant hypertension, suspected obstructive sleep apnea, BMI greater than 40, insulin dependent diabetes mellitus2  Admission 10/28/2011 for vulvar abscess requiring IV antibiotics and pain management-this is a medical consult for diabetes mellitus  Admission 11/25/2011 for acute CHF exacerbation with chronic renal  insufficiency and COPD/asthma?? Echocardiogram done 11/27/2011 showed an EF of 60%, diastolic dysfunction grade 2, PA peak pressure 40 mm mercury  Admission 12/19/2011 for acute diastolic CHF and hypertensive emergency with a UDS positive for cocaine-A1c at that  10.8  Recent admission 9/813 with acute on chronic diastolic dysfunction NYHA class II, stage IIIc daily acute respiratory failure with hypoxia-required BiPAP during that hospitalization transiently  Allegedly had a negative cardiac catheterization April 2013 at [facility] Course:  Please refer to HPI and progress notes Briefly, this lady was admitted with mild SIRS and had an LP to rule out meningitis given concomittant headaches-she was noted to be non-compliant on medications 2/2 to poor material resources and thought to also have decompensated Heart failure.   She was diuresed aggressively over the first 48 hours of her Hospital stay, kept on broad IV abx coverage which was narrowed as she subsequently was found to have E.Coli in her Urine culture She left AMA subsequent to this and was given no discharge medications or follow-up given her medical complexity and need for close in-patient monitoring  Consultants:  None currently Procedures:  Lumbar puncture 9/20 = negative for acute meningitis-cultures pending  CT head 9/20 = no acute intracranial abnormalities  CT abdomen pelvis with contrast 9/20 = normal appendix, no evidence of obstruction, no bowel thickening inflammatory changes, bladder mildly thickwalled potentially cystitis otherwise no CT findings to account for lower abdominal pain Antibiotics:  Received meningitis doses of vancomycin ceftriaxone on admission 9/20  Currently on Rocephin 1 g every 12 hourly-discussed with Id Dr. Ninetta Lights who recommends to conitnue Meninigitis doses until prelim CSF cult returns  Urine culture pending  CSF culture, HSV titer, cryptococcal antigen, VDRL are all pending  Discharge Exam: Filed Vitals:   02/05/12 0045 02/05/12 0340 02/05/12 0600 02/05/12 1354  BP:   137/87 133/90  Pulse:   79 67  Temp:   98.3 F (36.8 C) 98.7 F (37.1 C)  TempSrc:   Oral Oral  Resp:   20 20  Height:      Weight:      SpO2: 91% 92% 100% 99%     NONE-Left AMA  Discharge Instructions   NONE   The results of significant diagnostics from this hospitalization (including imaging, microbiology, ancillary and laboratory) are listed below for reference.    Significant Diagnostic Studies: Dg Chest 2 View  02/02/2012  *RADIOLOGY REPORT*  Clinical Data: Headache, nausea, vomiting and diarrhea.  Morbid obesity.  CHEST - 2 VIEW  Comparison: 01/21/2012 and chest CTA dated 01/21/2012.  Findings: Stable enlarged cardiac silhouette.  Clear lungs with normal vascularity.  The visualized bones are unremarkable.  IMPRESSION: Stable cardiomegaly.  No acute abnormality.   Original Report Authenticated By: Darrol Angel, M.D.    Dg Cervical Spine With Flex & Extend  02/04/2012  *RADIOLOGY REPORT*  Clinical Data: Left sided headache, evaluate for cervical impingement  CERVICAL SPINE COMPLETE WITH FLEXION AND EXTENSION VIEWS  Comparison: Head CT - 02/02/2012; 11/08/2011  Findings:  C1-2 and superior endplate of C6 is visualized on the lateral radiograph.  There is suboptimal visualization of the C6 - C7 and the cervical thoracic junction secondary to overlying osseous and soft tissue structures, even on the provided swimmers radiograph.  Normal alignment of the cervical spine.  No anterolisthesis or retrolisthesis.  Normal alignment is maintained on the acquired degrees of flexion and extension.  The dens is normally positioned between the lateral masses of C1.  Cervical vertebral  body heights are preserved.  Prevertebral soft tissues are normal.  Intervertebral disc spaces are preserved  The bilateral facets are normally aligned.  Regional soft tissues are normal.  Limited visualization of the lung apices is normal.  IMPRESSION: Degraded examination without acute findings.   Original Report Authenticated By: Waynard Reeds, M.D.    Ct Head Wo Contrast  02/02/2012  *RADIOLOGY REPORT*  Clinical Data: Headache, neck pain, pain behind left eye, dizziness,  nausea, vomiting  CT HEAD WITHOUT CONTRAST  Technique:  Contiguous axial images were obtained from the base of the skull through the vertex without contrast.  Comparison: 11/08/2010  Findings: Prominent cisterna magna, normal variant, unchanged. Normal ventricular morphology. No midline shift or mass effect. No intracranial hemorrhage, mass lesion, or evidence of acute infarction. No extra-axial fluid collections. Visualized paranasal sinuses and mastoid air cells clear. Skull intact.  IMPRESSION: No acute intracranial abnormalities.   Original Report Authenticated By: Lollie Marrow, M.D.    Ct Angio Chest Pe W/cm &/or Wo Cm  01/21/2012  *RADIOLOGY REPORT*  Clinical Data: Shortness of breath.  Chest pain.  Elevated blood pressure.  Elevated D-dimer.  CT ANGIOGRAPHY CHEST  Technique:  Multidetector CT imaging of the chest using the standard protocol during bolus administration of intravenous contrast. Multiplanar reconstructed images including MIPs were obtained and reviewed to evaluate the vascular anatomy.  Contrast:  100 ml Omnipaque 300  Comparison: 01/03/2012  Findings: Technically adequate study with moderately good opacification of the central and proximal segmental pulmonary arteries.  Distal segmental and peripheral pulmonary arteries are not well demonstrated.  No filling defects demonstrated centrally suggesting no evidence of significant pulmonary embolus.  Normal caliber thoracic aorta with limited contrast bolus.  No gross dissection although visualization of the aortic lumen is limited due to the technique.  Cardiac enlargement.  Pulmonary vascular congestion with hazy opacities in the lungs suggesting edema. Visualization of the lung fields is limited due to ration artifact. Atelectasis in the lung bases.  No pleural effusions.  No pneumothorax.  Airways appear patent. Borderline prominent mediastinal lymph nodes appear unchanged since the previous study. Visualized portions of the upper abdominal  organs are grossly unremarkable.  Degenerative changes in the thoracic spine.  IMPRESSION: No evidence of significant pulmonary embolus.  Cardiac enlargement with pulmonary vascular congestion and mild edema.   Original Report Authenticated By: Marlon Pel, M.D.    Ct Abdomen Pelvis W Contrast  02/02/2012  *RADIOLOGY REPORT*  Clinical Data: Lower abdominal pain  CT ABDOMEN AND PELVIS WITH CONTRAST  Technique:  Multidetector CT imaging of the abdomen and pelvis was performed following the standard protocol during bolus administration of intravenous contrast.  Contrast: 80mL OMNIPAQUE IOHEXOL 300 MG/ML  SOLN  Comparison: 10/28/2011  Findings: Mild mosaic attenuation/dependent atelectasis at the lung bases.  Liver, spleen, pancreas, and adrenal glands are within normal limits.  Status post cholecystectomy.  No intrahepatic or extrahepatic ductal dilatation.  Kidneys are unremarkable.  No hydronephrosis.  No evidence of bowel obstruction.  Normal appendix.  No colonic wall thickening or inflammatory changes.  Atherosclerotic calcifications of the abdominal aorta and branch vessels.  Small retroperitoneal lymph nodes which do not meet pathologic CT size criteria.  Uterus and bilateral ovaries are unremarkable.  Bladder is mildly thick-walled, correlate for cystitis.  Degenerative changes of the visualized thoracolumbar spine.  IMPRESSION: Normal appendix.  No evidence of bowel obstruction.  No colonic wall thickening or inflammatory changes.  Bladder is mildly thick-walled, correlate for cystitis.  Otherwise, no CT findings to account for the patient's lower abdominal pain.   Original Report Authenticated By: Charline Bills, M.D.    Dg Chest Portable 1 View  01/21/2012  *RADIOLOGY REPORT*  Clinical Data: Shortness of breath.  Chest pain.  Edema in the feet.  PORTABLE CHEST - 1 VIEW  Comparison: Chest x-ray 01/03/2012.  Findings: Lung volumes are low.  Bibasilar opacities (left greater than right) are  favored to be artifactual related to underpenetration of the film, but may relate to areas of subsegmental atelectasis.  No definite consolidative airspace disease.  No pleural effusions.  Pulmonary venous congestion without frank pulmonary edema.  Cardiomegaly is again noted. The patient is rotated to the left on today's exam, resulting in distortion of the mediastinal contours and reduced diagnostic sensitivity and specificity for mediastinal pathology.  IMPRESSION: 1.  Low lung volumes. 2.  Cardiomegaly with mild pulmonary venous congestion, but no frank pulmonary edema.   Original Report Authenticated By: Florencia Reasons, M.D.    Dg Lumbar Puncture Fluoro Guide  02/02/2012  *RADIOLOGY REPORT*  Clinical Data: Headache.  Neck pain.  Fever.  FLUOROSCOPIC GUIDED LUMBAR PUNCTURE  Comparison: None.  Fluoroscopy time:  1.03 minutes  Description: The procedure and risks were discussed with the patient, including the risks of bleeding, infection and CSF leak. Informed consent was obtained.  Preliminary fluoroscopic survey of the lumbar spine demonstrated five non-rib bearing lumbar vertebrae.  Using sterile technique, local anesthesia and fluoroscopic guidance, a 20 gauge spinal needle was inserted into the spinal canal at the L3-L4 level.  10 ml of clear, colorless CSF was withdrawn and sent to the laboratory for testing.  An opening pressure of 39 cm of water was obtained and a closing pressure of 21 cm of water was obtained.  The patient tolerated the procedure well with no immediate complications.  IMPRESSION: Successful fluoroscopic guided lumbar puncture, as described above.   Original Report Authenticated By: Darrol Angel, M.D.     Microbiology: Recent Results (from the past 240 hour(s))  URINE CULTURE     Status: Normal   Collection Time   02/02/12  9:34 AM      Component Value Range Status Comment   Specimen Description URINE, CATHETERIZED   Final    Special Requests Normal   Final    Culture   Setup Time 02/03/2012 01:30   Final    Colony Count >=100,000 COLONIES/ML   Final    Culture ESCHERICHIA COLI   Final    Report Status 02/05/2012 FINAL   Final    Organism ID, Bacteria ESCHERICHIA COLI   Final   MRSA PCR SCREENING     Status: Normal   Collection Time   02/03/12 12:30 AM      Component Value Range Status Comment   MRSA by PCR NEGATIVE  NEGATIVE Final      Labs: Basic Metabolic Panel: No results found for this basename: NA:5,K:5,CL:5,CO2:5,GLUCOSE:5,BUN:5,CREATININE:5,CALCIUM:5,MG:5,PHOS:5 in the last 168 hours Liver Function Tests: No results found for this basename: AST:5,ALT:5,ALKPHOS:5,BILITOT:5,PROT:5,ALBUMIN:5 in the last 168 hours No results found for this basename: LIPASE:5,AMYLASE:5 in the last 168 hours No results found for this basename: AMMONIA:5 in the last 168 hours CBC: No results found for this basename: WBC:5,NEUTROABS:5,HGB:5,HCT:5,MCV:5,PLT:5 in the last 168 hours Cardiac Enzymes: No results found for this basename: CKTOTAL:5,CKMB:5,CKMBINDEX:5,TROPONINI:5 in the last 168 hours BNP: BNP (last 3 results)  Basename 01/21/12 1740 01/03/12 2020 12/19/11 1310  PROBNP 2518.0* 2112.0* 2539.0*   CBG:  Lab 02/05/12  1137  GLUCAP 205*    Time coordinating discharge: 10 minutes  Signed:  Rhetta Mura  [facility] 02/12/2012, 8:48 AM

## 2012-02-19 ENCOUNTER — Emergency Department (HOSPITAL_COMMUNITY)
Admission: EM | Admit: 2012-02-19 | Discharge: 2012-02-19 | Disposition: A | Discharge status: Home / Self Care | Payer: Medicare Other | Attending: Emergency Medicine | Admitting: Emergency Medicine

## 2012-02-19 DIAGNOSIS — M129 Arthropathy, unspecified: Secondary | ICD-10-CM | POA: Insufficient documentation

## 2012-02-19 DIAGNOSIS — I509 Heart failure, unspecified: Secondary | ICD-10-CM | POA: Insufficient documentation

## 2012-02-19 DIAGNOSIS — G4730 Sleep apnea, unspecified: Secondary | ICD-10-CM | POA: Insufficient documentation

## 2012-02-19 DIAGNOSIS — Z794 Long term (current) use of insulin: Secondary | ICD-10-CM | POA: Insufficient documentation

## 2012-02-19 DIAGNOSIS — F17200 Nicotine dependence, unspecified, uncomplicated: Secondary | ICD-10-CM | POA: Insufficient documentation

## 2012-02-19 DIAGNOSIS — E119 Type 2 diabetes mellitus without complications: Secondary | ICD-10-CM | POA: Insufficient documentation

## 2012-02-19 DIAGNOSIS — M5430 Sciatica, unspecified side: Secondary | ICD-10-CM | POA: Insufficient documentation

## 2012-02-19 DIAGNOSIS — M5431 Sciatica, right side: Secondary | ICD-10-CM

## 2012-02-19 DIAGNOSIS — G8929 Other chronic pain: Secondary | ICD-10-CM | POA: Insufficient documentation

## 2012-02-19 DIAGNOSIS — Z79899 Other long term (current) drug therapy: Secondary | ICD-10-CM | POA: Insufficient documentation

## 2012-02-19 DIAGNOSIS — I10 Essential (primary) hypertension: Secondary | ICD-10-CM | POA: Insufficient documentation

## 2012-02-19 MED ORDER — DIAZEPAM 5 MG PO TABS
5.0000 mg | ORAL_TABLET | Freq: Once | ORAL | Status: AC
  Administered 2012-02-19: 5 mg via ORAL
  Filled 2012-02-19: qty 1

## 2012-02-19 MED ORDER — GABAPENTIN 300 MG PO CAPS: 300.0000 mg | ORAL_CAPSULE | Freq: Three times a day (TID) | ORAL | Status: AC

## 2012-02-19 MED ORDER — OXYCODONE-ACETAMINOPHEN 5-325 MG PO TABS
1.0000 | ORAL_TABLET | Freq: Once | ORAL | Status: AC
  Administered 2012-02-19: 1 via ORAL
  Filled 2012-02-19: qty 1

## 2012-02-19 MED ORDER — HYDROMORPHONE HCL PF 1 MG/ML IJ SOLN
1.0000 mg | Freq: Once | INTRAMUSCULAR | Status: AC
  Administered 2012-02-19: 1 mg via INTRAVENOUS
  Filled 2012-02-19: qty 1

## 2012-02-19 MED ORDER — DIAZEPAM 5 MG PO TABS: 5.0000 mg | ORAL_TABLET | Freq: Three times a day (TID) | ORAL | Status: AC | PRN

## 2012-02-19 MED ORDER — GABAPENTIN 300 MG PO CAPS
600.0000 mg | ORAL_CAPSULE | Freq: Every day | ORAL | Status: DC
  Administered 2012-02-19: 600 mg via ORAL
  Filled 2012-02-19 (×2): qty 2

## 2012-02-19 NOTE — ED Notes (Signed)
Bed:WA23<BR> Expected date:<BR> Expected time:<BR> Means of arrival:<BR> Comments:<BR> EMS

## 2012-02-19 NOTE — ED Notes (Signed)
AS per EMS pt sts she rolled over in bed and had back pain radiating down right leg.VSS.pwd.No LOC/N/V. Denies any trauma

## 2012-02-19 NOTE — ED Notes (Signed)
D/C instructions reviewed. Rx given.

## 2012-02-19 NOTE — ED Provider Notes (Signed)
History     CSN: [id number]  Arrival date & time 02/19/12  0019   First MD Initiated Contact with Patient 02/19/12 [phone]      Chief Complaint  Patient presents with  . Back Pain    (Consider location/radiation/quality/duration/timing/severity/associated sxs/prior treatment) HPI 36 year old female presents to emergency department with acute chronic back pain. Patient reports tonight she rolled over in bed and had sudden shooting pain down her right buttock and leg. She reports her back pain starts at the base of her neck and goes all the way down into her right leg. It is on the sides of her spine as well as her spine. She reports she always has some back pain, as well as history of right-sided sciatica, but since rolling over the pain is much worse. She denies any bowel or bladder incontinence. Patient reports having lumbar puncture 2 weeks ago secondary to fever and workup for possible meningitis. She denies any further fevers, no point tenderness to the area of the lumbar puncture, no headache. Patient has pins and needle sensation to her right thigh but no loss of sensation weakness or loss of movement.   Past Medical History  Diagnosis Date  . Diabetes mellitus   . Asthma   . CHF (congestive heart failure)   . Chronic back pain   . Migraine headache   . Hypertension   . Morbid obesity   . Polysubstance abuse   . Sleep apnea   . Drug-seeking behavior   . Urinary tract infection   . Arthritis     Past Surgical History  Procedure Date  . Cholecystectomy   . Cardiac catheterization     two, no blockage    Family History  Problem Relation Age of Onset  . Other Neg Hx     History  Substance Use Topics  . Smoking status: Current Every Day Smoker -- 0.5 packs/day for 16 years    Types: Cigarettes  . Smokeless tobacco: Never Used  . Alcohol Use: No     quit 5 yrs ago    OB History    Grav Para Term Preterm Abortions TAB SAB Ect Mult Living   1    1  1    0       Review of Systems  All other systems reviewed and are negative.    Allergies  Review of patient's allergies indicates no known allergies.  Home Medications   Current Outpatient Rx  Name Route Sig Dispense Refill  . ACETAMINOPHEN 325 MG PO TABS Oral Take 650 mg by mouth every 6 (six) hours as needed. Pain/fever.    . ALBUTEROL SULFATE HFA 108 (90 BASE) MCG/ACT IN AERS Inhalation Inhale 2 puffs into the lungs every 6 (six) hours as needed. For asthma attacks.    . ASPIRIN 81 MG PO CHEW Oral Chew 81 mg by mouth daily.    Marland Kitchen CLONIDINE HCL 0.1 MG PO TABS Oral Take 0.1 mg by mouth 2 (two) times daily.    Marland Kitchen DILTIAZEM HCL ER COATED BEADS 120 MG PO CP24 Oral Take 120 mg by mouth daily.    Marland Kitchen EXENATIDE 5 MCG/0.02ML [place] SOLN Subcutaneous Inject 5 mcg into the skin every morning.    . FUROSEMIDE 20 MG PO TABS Oral Take 2 tablets (40 mg total) by mouth 2 (two) times daily. 60 tablet 0  . HYDRALAZINE HCL 25 MG PO TABS Oral Take 50 mg by mouth every 8 (eight) hours.    . INSULIN ASPART 100  UNIT/ML [place] SOLN Subcutaneous Inject 0-20 Units into the skin 3 (three) times daily before meals. Sliding scale.    . INSULIN GLARGINE 100 UNIT/ML [place] SOLN Subcutaneous Inject 40 Units into the skin at bedtime.     . ISOSORBIDE MONONITRATE ER 30 MG PO TB24 Oral Take 1 tablet (30 mg total) by mouth daily. 30 tablet 0  . LORAZEPAM 2 MG PO TABS Oral Take 2 mg by mouth every 6 (six) hours as needed. For anxiety.    Marland Kitchen METOCLOPRAMIDE HCL 10 MG PO TABS Oral Take 10 mg by mouth every 6 (six) hours as needed.    . OXYCODONE-ACETAMINOPHEN 5-325 MG PO TABS Oral Take 1 tablet by mouth every 4 (four) hours as needed. pain    . POTASSIUM CHLORIDE CRYS ER 20 MEQ PO TBCR Oral Take 40 mEq by mouth daily.    . SERTRALINE HCL 25 MG PO TABS Oral Take 1 tablet (25 mg total) by mouth daily. 30 tablet 0    BP 173/109  Pulse 86  Temp 97.8 F (36.6 C) (Oral)  Resp 18  Ht 5\' 5"  (1.651 m)  Wt 340 lb (154.223 kg)  BMI 56.58 kg/m2   SpO2 97%  LMP 01/24/2012  Physical Exam  Nursing note and vitals reviewed. Constitutional: She is oriented to person, place, and time. She appears distressed.       Morbidly obese, uncomfortable appearing  HENT:  Head: Normocephalic and atraumatic.  Eyes: EOM are normal. Pupils are equal, round, and reactive to light.  Neck: Normal range of motion. Neck supple. No JVD present. No tracheal deviation present. No thyromegaly present.  Cardiovascular: Normal rate, regular rhythm, normal heart sounds and intact distal pulses.  Exam reveals no gallop and no friction rub.   No murmur heard. Pulmonary/Chest: Effort normal and breath sounds normal. No stridor. No respiratory distress. She has no wheezes. She has no rales. She exhibits no tenderness.  Abdominal: Soft. Bowel sounds are normal. She exhibits no distension and no mass. There is no tenderness. There is no rebound and no guarding.  Musculoskeletal: Normal range of motion. She exhibits tenderness (tenderness with palpation to back. Tenderness is both midline over her thoracic and lumbar vertebrae and paraspinal, and bilateral flank without step-off, crepitus, overlying skin abnormalities.). She exhibits no edema.       Positive straight leg raise on the right, negative on the left. No motor deficit on the right, no sensory deficit on the right  Lymphadenopathy:    She has no cervical adenopathy.  Neurological: She is alert and oriented to person, place, and time. She displays normal reflexes. No cranial nerve deficit. She exhibits normal muscle tone. Coordination normal.  Skin: Skin is warm and dry. No rash noted. No erythema. No pallor.    ED Course  Procedures (including critical care time)  Labs Reviewed - No data to display No results found.   1. Sciatica of right side   2. Hypertension       MDM  36 year old female with acute on chronic back pain. No red flags noted on physical exam area patient with lumbar puncture 2 weeks  ago, but does not have history or physical to support epidural abscess. Patient feeling better after medications. Followup with Dr. Mikeal Hawthorne, her primary care Dr. for further workup and evaluation of her symptoms.        Olivia Mackie, MD 02/19/12 [phone]

## 2012-02-21 ENCOUNTER — Encounter (HOSPITAL_COMMUNITY): Payer: Self-pay | Admitting: *Deleted

## 2012-02-21 ENCOUNTER — Emergency Department (HOSPITAL_COMMUNITY)
Admission: EM | Admit: 2012-02-21 | Discharge: 2012-02-21 | Disposition: A | Discharge status: Home / Self Care | Payer: Medicare Other | Attending: Emergency Medicine | Admitting: Emergency Medicine

## 2012-02-21 ENCOUNTER — Emergency Department (HOSPITAL_COMMUNITY): Payer: Medicare Other

## 2012-02-21 DIAGNOSIS — Z9089 Acquired absence of other organs: Secondary | ICD-10-CM | POA: Insufficient documentation

## 2012-02-21 DIAGNOSIS — Z79899 Other long term (current) drug therapy: Secondary | ICD-10-CM | POA: Insufficient documentation

## 2012-02-21 DIAGNOSIS — R079 Chest pain, unspecified: Secondary | ICD-10-CM

## 2012-02-21 DIAGNOSIS — R0602 Shortness of breath: Secondary | ICD-10-CM | POA: Insufficient documentation

## 2012-02-21 DIAGNOSIS — Z7982 Long term (current) use of aspirin: Secondary | ICD-10-CM | POA: Insufficient documentation

## 2012-02-21 DIAGNOSIS — F411 Generalized anxiety disorder: Secondary | ICD-10-CM | POA: Insufficient documentation

## 2012-02-21 DIAGNOSIS — I509 Heart failure, unspecified: Secondary | ICD-10-CM | POA: Insufficient documentation

## 2012-02-21 DIAGNOSIS — J45909 Unspecified asthma, uncomplicated: Secondary | ICD-10-CM | POA: Insufficient documentation

## 2012-02-21 DIAGNOSIS — E119 Type 2 diabetes mellitus without complications: Secondary | ICD-10-CM | POA: Insufficient documentation

## 2012-02-21 DIAGNOSIS — F419 Anxiety disorder, unspecified: Secondary | ICD-10-CM

## 2012-02-21 DIAGNOSIS — G8929 Other chronic pain: Secondary | ICD-10-CM | POA: Insufficient documentation

## 2012-02-21 DIAGNOSIS — Z794 Long term (current) use of insulin: Secondary | ICD-10-CM | POA: Insufficient documentation

## 2012-02-21 LAB — D-DIMER, QUANTITATIVE: D-Dimer, Quant: 0.73 ug/mL-FEU — ABNORMAL HIGH (ref 0.00–0.48)

## 2012-02-21 LAB — CBC WITH DIFFERENTIAL/PLATELET
Basophils Absolute: 0 10*3/uL (ref 0.0–0.1)
Basophils Relative: 0 % (ref 0–1)
Eosinophils Relative: 1 % (ref 0–5)
HCT: 34.1 % — ABNORMAL LOW (ref 36.0–46.0)
Lymphocytes Relative: 15 % (ref 12–46)
MCHC: 32.8 g/dL (ref 30.0–36.0)
MCV: 89 fL (ref 78.0–100.0)
Monocytes Absolute: 0.5 10*3/uL (ref 0.1–1.0)
Neutro Abs: 5.7 10*3/uL (ref 1.7–7.7)
Platelets: 237 10*3/uL (ref 150–400)
RDW: 16 % — ABNORMAL HIGH (ref 11.5–15.5)
WBC: 7.5 10*3/uL (ref 4.0–10.5)

## 2012-02-21 LAB — BASIC METABOLIC PANEL
CO2: 29 mEq/L (ref 19–32)
Calcium: 9.4 mg/dL (ref 8.4–10.5)
Creatinine, Ser: 1.36 mg/dL — ABNORMAL HIGH (ref 0.50–1.10)
GFR calc Af Amer: 57 mL/min — ABNORMAL LOW (ref >90)
Sodium: 137 mEq/L (ref 135–145)

## 2012-02-21 LAB — TROPONIN I: Troponin I: <0.3 ng/mL (ref <0.30)

## 2012-02-21 MED ORDER — HYDROMORPHONE HCL PF 1 MG/ML IJ SOLN: 0.5000 mg | Freq: Once | INTRAMUSCULAR | Status: DC

## 2012-02-21 MED ORDER — FUROSEMIDE 10 MG/ML IJ SOLN
80.0000 mg | Freq: Once | INTRAMUSCULAR | Status: AC
  Administered 2012-02-21: 80 mg via INTRAVENOUS
  Filled 2012-02-21: qty 8

## 2012-02-21 MED ORDER — OXYCODONE-ACETAMINOPHEN 5-325 MG PO TABS: 1.0000 | ORAL_TABLET | Freq: Once | ORAL | Status: DC

## 2012-02-21 MED ORDER — LORAZEPAM 1 MG PO TABS: 1.0000 mg | ORAL_TABLET | Freq: Three times a day (TID) | ORAL | Status: AC | PRN

## 2012-02-21 MED ORDER — ONDANSETRON HCL 4 MG/2ML IJ SOLN
4.0000 mg | Freq: Once | INTRAMUSCULAR | Status: AC
  Administered 2012-02-21: 4 mg via INTRAVENOUS
  Filled 2012-02-21: qty 2

## 2012-02-21 MED ORDER — HYDROMORPHONE HCL PF 1 MG/ML IJ SOLN
1.0000 mg | Freq: Once | INTRAMUSCULAR | Status: AC
  Administered 2012-02-21: 1 mg via INTRAVENOUS
  Filled 2012-02-21: qty 1

## 2012-02-21 MED ORDER — LORAZEPAM 2 MG/ML IJ SOLN
1.0000 mg | Freq: Once | INTRAMUSCULAR | Status: AC
  Administered 2012-02-21: 1 mg via INTRAVENOUS
  Filled 2012-02-21: qty 1

## 2012-02-21 MED ORDER — LORAZEPAM 1 MG PO TABS
1.0000 mg | ORAL_TABLET | Freq: Once | ORAL | Status: AC
  Administered 2012-02-21: 1 mg via ORAL
  Filled 2012-02-21: qty 1

## 2012-02-21 MED ORDER — OXYCODONE-ACETAMINOPHEN 5-325 MG PO TABS
2.0000 | ORAL_TABLET | Freq: Once | ORAL | Status: AC
  Administered 2012-02-21: 2 via ORAL
  Filled 2012-02-21: qty 2

## 2012-02-21 NOTE — ED Provider Notes (Signed)
History     CSN: [id number]  Arrival date & time 02/21/12  3086   First MD Initiated Contact with Patient 02/21/12 [phone]      Chief Complaint  Patient presents with  . Chest Pain    (Consider location/radiation/quality/duration/timing/severity/associated sxs/prior treatment) HPI Comments: Debra Stokes is a 37 y.o. female who presents for evaluation of chest pain, and shortness of breath that started upon awakening this morning. She presents by EMS after they gave her 3 sublingual nitroglycerin and aspirin. She states that it helped her chest discomfort, somewhat. She has had recent cardiac catheterization with negative findings for blockage. Her chest discomfort is worse with deep breathing. On arrival her chest pain is in her 10. She did not take anything at home for the pain. There is nothing that makes the pain better. She did not try her Percocet today. She has chronic low back pain. She denies nausea, vomiting, or diaphoresis. She was in emergency department 2 days ago, and treated for sciatica. She was discharged with prescriptions for Valium, and gabapentin.  Patient is a 36 y.o. female presenting with chest pain. The history is provided by the patient.  Chest Pain     Past Medical History  Diagnosis Date  . Diabetes mellitus   . Asthma   . CHF (congestive heart failure)   . Chronic back pain   . Migraine headache   . Hypertension   . Morbid obesity   . Polysubstance abuse   . Sleep apnea   . Drug-seeking behavior   . Urinary tract infection   . Arthritis     Past Surgical History  Procedure Date  . Cholecystectomy   . Cardiac catheterization     two, no blockage    Family History  Problem Relation Age of Onset  . Other Neg Hx     History  Substance Use Topics  . Smoking status: Current Every Day Smoker -- 0.5 packs/day for 16 years    Types: Cigarettes  . Smokeless tobacco: Never Used  . Alcohol Use: No     quit 5 yrs ago    OB History    Grav  Para Term Preterm Abortions TAB SAB Ect Mult Living   1    1  1    0      Review of Systems  Cardiovascular: Positive for chest pain.  All other systems reviewed and are negative.    Allergies  Review of patient's allergies indicates no known allergies.  Home Medications   Current Outpatient Rx  Name Route Sig Dispense Refill  . ACETAMINOPHEN 325 MG PO TABS Oral Take 650 mg by mouth every 6 (six) hours as needed. Pain/fever.    . ALBUTEROL SULFATE HFA 108 (90 BASE) MCG/ACT IN AERS Inhalation Inhale 2 puffs into the lungs every 6 (six) hours as needed. For asthma attacks.    . ASPIRIN 81 MG PO CHEW Oral Chew 81 mg by mouth daily.    Marland Kitchen CLONIDINE HCL 0.1 MG PO TABS Oral Take 0.1 mg by mouth 2 (two) times daily.    Marland Kitchen DIAZEPAM 5 MG PO TABS Oral Take 1 tablet (5 mg total) by mouth every 8 (eight) hours as needed (muscle spasm). 15 tablet 0  . DILTIAZEM HCL ER COATED BEADS 120 MG PO CP24 Oral Take 120 mg by mouth daily.    Marland Kitchen EXENATIDE 5 MCG/0.02ML [place] SOLN Subcutaneous Inject 5 mcg into the skin every morning.    . FUROSEMIDE 20 MG PO TABS Oral  Take 2 tablets (40 mg total) by mouth 2 (two) times daily. 60 tablet 0  . GABAPENTIN 300 MG PO CAPS Oral Take 1 capsule (300 mg total) by mouth 3 (three) times daily. 60 capsule 0  . HYDRALAZINE HCL 25 MG PO TABS Oral Take 50 mg by mouth every 8 (eight) hours.    . INSULIN ASPART 100 UNIT/ML [place] SOLN Subcutaneous Inject 0-20 Units into the skin 3 (three) times daily before meals. Sliding scale.    . INSULIN GLARGINE 100 UNIT/ML  SOLN Subcutaneous Inject 40 Units into the skin at bedtime.     . ISOSORBIDE MONONITRATE ER 30 MG PO TB24 Oral Take 1 tablet (30 mg total) by mouth daily. 30 tablet 0  . LORAZEPAM 2 MG PO TABS Oral Take 2 mg by mouth every 6 (six) hours as needed. For anxiety.    Marland Kitchen METOCLOPRAMIDE HCL 10 MG PO TABS Oral Take 10 mg by mouth every 6 (six) hours as needed. For indigestion    . OXYCODONE-ACETAMINOPHEN 5-325 MG PO TABS Oral Take 1  tablet by mouth every 4 (four) hours as needed. pain    . POTASSIUM CHLORIDE CRYS ER 20 MEQ PO TBCR Oral Take 40 mEq by mouth daily.    . SERTRALINE HCL 25 MG PO TABS Oral Take 1 tablet (25 mg total) by mouth daily. 30 tablet 0  . LORAZEPAM 1 MG PO TABS Oral Take 1 tablet (1 mg total) by mouth 3 (three) times daily as needed for anxiety. 9 tablet 0  . LORAZEPAM 1 MG PO TABS Oral Take 1 tablet (1 mg total) by mouth 3 (three) times daily as needed for anxiety. 10 tablet 0    BP 149/92  Pulse 89  Temp 98.6 F (37 C) (Oral)  Resp 22  SpO2 99%  LMP 01/24/2012  Physical Exam  Nursing note and vitals reviewed. Constitutional: She is oriented to person, place, and time. She appears well-developed and well-nourished. She appears distressed (uncomfortable).        OBESE  HENT:  Head: Normocephalic and atraumatic.  Eyes: Conjunctivae normal and EOM are normal. Pupils are equal, round, and reactive to light.  Neck: Normal range of motion and phonation normal. Neck supple.  Cardiovascular: Normal rate, regular rhythm and intact distal pulses.   Pulmonary/Chest: Effort normal and breath sounds normal. She has no wheezes. She exhibits no tenderness (Left anterior, mild).  Abdominal: Soft. She exhibits no distension. There is no tenderness. There is no guarding.  Musculoskeletal: Normal range of motion. She exhibits no edema and no tenderness.  Neurological: She is alert and oriented to person, place, and time. She has normal strength. She exhibits normal muscle tone.  Skin: Skin is warm and dry.  Psychiatric: Her behavior is normal. Judgment and thought content normal.       Anxious, tearful    ED Course  Procedures (including critical care time)  Labs Reviewed  D-DIMER, QUANTITATIVE - Abnormal; Notable for the following:    D-Dimer, Quant 0.73 (*)     All other components within normal limits  CBC WITH DIFFERENTIAL - Abnormal; Notable for the following:    RBC 3.83 (*)     Hemoglobin 11.2  (*)     HCT 34.1 (*)     RDW 16.0 (*)     All other components within normal limits  BASIC METABOLIC PANEL - Abnormal; Notable for the following:    Glucose, Bld 184 (*)     Creatinine, Ser 1.36 (*)  GFR calc non Af Amer 49 (*)     GFR calc Af Amer 57 (*)     All other components within normal limits  TROPONIN I   Dg Chest Port 1 View  02/21/2012  *RADIOLOGY REPORT*  Clinical Data: Left chest pain and left arm discomfort.  PORTABLE CHEST - 1 VIEW  Comparison: 02/02/2012.  Findings: Trachea is midline.  Heart is enlarged.  Lungs are somewhat low in volume with mild diffuse interstitial prominence and indistinctness.  IMPRESSION: Pulmonary edema.   Original Report Authenticated By: Reyes Ivan, M.D.     Date: 02/21/2012  Rate: 103  Rhythm: sinus tachycardia  QRS Axis: normal  Intervals: QT prolonged  ST/T Wave abnormalities: normal  Narrative Interpretation:   Old EKG Reviewed: changes noted- QT longer   1. Chest pain   2. Anxiety       MDM  Nonspecific chest pain. Clinically doubt ACS, or pneumonia.   Patient transferred to the clinical decision unit for continued observation and treatment, with disposition to be determined, based on clinical outcome.     Flint Melter, MD 02/21/12 2111

## 2012-02-21 NOTE — ED Notes (Signed)
PA REMINDED PT REQUESTS PAIN MED

## 2012-02-21 NOTE — ED Notes (Signed)
Pt states earlier headache has improved. She can use her left arm but feels asleep.

## 2012-02-21 NOTE — ED Provider Notes (Signed)
Pt originally seen by Dr. Effie Shy for chest pain. EKG and Troponin normal. Pts d-dimer is elevated, however, it is mild and she has had two recent CT angio scans of chest to evaluate for PE in the past 1.5 months. First one on Jan 03, 2012 and second one on Sept 8, 2013. These were both negative. We do not feel it is warranted to work up the d-dimer of 0.7 for her chronic symptoms.  The patient does have pulmonary edema at this time. After discussing with Dr. Effie Shy, we have ordered 80mg  IV lasix and will re-evaluate.   Patient has expressed a lot of urine in the ED and is feeling better. She is still having some discomfort in the same location she normally does. Ativan makes her pain significantly better. I question anxiety component to her symptoms. She felt as though she is ready to go if she gets Ativan at DC.  I feel pt is okay to go at this time. She has been advised to follow-up and call dr Raliegh Ip office today to schedule appt.  Pt has been advised of the symptoms that warrant their return to the ED. Patient has voiced understanding and has agreed to follow-up with the PCP or specialist.   Dorthula Matas, PA 02/21/12 1454

## 2012-02-21 NOTE — ED Notes (Signed)
BEDSIDE COMMODE SET UP FOR PT TO USE. PT VERBALIZES SHE IS ABLE TO GET OOB TO [facility]. WILL MONITOR OUTPUT. PT STILL COMPLAINING OF LEFT SIDED CHEST PAIN UNDER HER BREAST. TEARFUL AT TIMES. PA IS AWARE.

## 2012-02-21 NOTE — ED Notes (Addendum)
EMS called for chest pain 10/10- Gave 3 NTG's and 324 of ASA enroute.  Patient denies N/V or diaphoresis with episode but does complain of Shortness of breath.  States "hurts to take a deep breath and to move".  Pain remains 8/10 after NTG's.  Complains of HA at present, 10/10.

## 2012-02-21 NOTE — ED Provider Notes (Signed)
Medical screening examination/treatment/procedure(s) were conducted as a shared visit with non-physician practitioner(s) and myself.  I personally evaluated the patient during the encounter  D-Dimer is < 1.0; I feel that this is reassuring that the pt does not have a PE causing her discomfort. She is improved in the ED and stable for d/c  Flint Melter, MD 02/21/12 2113

## 2012-02-21 NOTE — ED Notes (Signed)
Pa is aware of pt results. She is consulting with edp about plan of care. Pt has had 2 very recent ct angio chests .

## 2012-02-22 ENCOUNTER — Encounter (HOSPITAL_COMMUNITY): Payer: Self-pay | Admitting: Emergency Medicine

## 2012-07-03 ENCOUNTER — Encounter (HOSPITAL_COMMUNITY): Payer: Self-pay | Admitting: *Deleted

## 2012-07-03 ENCOUNTER — Emergency Department (HOSPITAL_COMMUNITY)
Admission: EM | Admit: 2012-07-03 | Discharge: 2012-07-03 | Disposition: A | Discharge status: Home / Self Care | Payer: Medicare Other | Attending: Emergency Medicine | Admitting: Emergency Medicine

## 2012-07-03 ENCOUNTER — Emergency Department (HOSPITAL_COMMUNITY): Payer: Medicare Other

## 2012-07-03 DIAGNOSIS — I509 Heart failure, unspecified: Secondary | ICD-10-CM | POA: Insufficient documentation

## 2012-07-03 DIAGNOSIS — Z79899 Other long term (current) drug therapy: Secondary | ICD-10-CM | POA: Insufficient documentation

## 2012-07-03 DIAGNOSIS — G8929 Other chronic pain: Secondary | ICD-10-CM | POA: Insufficient documentation

## 2012-07-03 DIAGNOSIS — E119 Type 2 diabetes mellitus without complications: Secondary | ICD-10-CM | POA: Insufficient documentation

## 2012-07-03 DIAGNOSIS — Z8739 Personal history of other diseases of the musculoskeletal system and connective tissue: Secondary | ICD-10-CM | POA: Insufficient documentation

## 2012-07-03 DIAGNOSIS — Z794 Long term (current) use of insulin: Secondary | ICD-10-CM | POA: Insufficient documentation

## 2012-07-03 DIAGNOSIS — F17200 Nicotine dependence, unspecified, uncomplicated: Secondary | ICD-10-CM | POA: Insufficient documentation

## 2012-07-03 DIAGNOSIS — G4730 Sleep apnea, unspecified: Secondary | ICD-10-CM | POA: Insufficient documentation

## 2012-07-03 DIAGNOSIS — I10 Essential (primary) hypertension: Secondary | ICD-10-CM | POA: Insufficient documentation

## 2012-07-03 DIAGNOSIS — M549 Dorsalgia, unspecified: Secondary | ICD-10-CM | POA: Insufficient documentation

## 2012-07-03 DIAGNOSIS — R079 Chest pain, unspecified: Secondary | ICD-10-CM | POA: Insufficient documentation

## 2012-07-03 DIAGNOSIS — Z87440 Personal history of urinary (tract) infections: Secondary | ICD-10-CM | POA: Insufficient documentation

## 2012-07-03 DIAGNOSIS — Z3202 Encounter for pregnancy test, result negative: Secondary | ICD-10-CM | POA: Insufficient documentation

## 2012-07-03 DIAGNOSIS — Z7982 Long term (current) use of aspirin: Secondary | ICD-10-CM | POA: Insufficient documentation

## 2012-07-03 DIAGNOSIS — J45909 Unspecified asthma, uncomplicated: Secondary | ICD-10-CM | POA: Insufficient documentation

## 2012-07-03 LAB — CBC WITH DIFFERENTIAL/PLATELET
Basophils Absolute: 0 10*3/uL (ref 0.0–0.1)
Basophils Relative: 0 % (ref 0–1)
Eosinophils Absolute: 0 10*3/uL (ref 0.0–0.7)
Eosinophils Relative: 1 % (ref 0–5)
HCT: 36.9 % (ref 36.0–46.0)
Hemoglobin: 11.4 g/dL — ABNORMAL LOW (ref 12.0–15.0)
MCH: 25.9 pg — ABNORMAL LOW (ref 26.0–34.0)
MCHC: 30.9 g/dL (ref 30.0–36.0)
MCV: 83.7 fL (ref 78.0–100.0)
Monocytes Absolute: 0.5 10*3/uL (ref 0.1–1.0)
Monocytes Relative: 7 % (ref 3–12)
Neutro Abs: 5.4 10*3/uL (ref 1.7–7.7)
RDW: 16.5 % — ABNORMAL HIGH (ref 11.5–15.5)

## 2012-07-03 LAB — POCT I-STAT TROPONIN I: Troponin i, poc: 0.05 ng/mL (ref 0.00–0.08)

## 2012-07-03 LAB — COMPREHENSIVE METABOLIC PANEL
AST: 13 U/L (ref 0–37)
Albumin: 3.3 g/dL — ABNORMAL LOW (ref 3.5–5.2)
BUN: 17 mg/dL (ref 6–23)
Calcium: 9.1 mg/dL (ref 8.4–10.5)
Chloride: 93 mEq/L — ABNORMAL LOW (ref 96–112)
Creatinine, Ser: 1.38 mg/dL — ABNORMAL HIGH (ref 0.50–1.10)
Total Bilirubin: 1.5 mg/dL — ABNORMAL HIGH (ref 0.3–1.2)

## 2012-07-03 LAB — POCT PREGNANCY, URINE: Preg Test, Ur: NEGATIVE

## 2012-07-03 LAB — GLUCOSE, CAPILLARY: Glucose-Capillary: 283 mg/dL — ABNORMAL HIGH (ref 70–99)

## 2012-07-03 MED ORDER — HYDROMORPHONE HCL PF 1 MG/ML IJ SOLN
1.0000 mg | Freq: Once | INTRAMUSCULAR | Status: AC
  Administered 2012-07-03: 1 mg via INTRAVENOUS
  Filled 2012-07-03: qty 1

## 2012-07-03 MED ORDER — NITROGLYCERIN 0.4 MG SL SUBL
0.4000 mg | SUBLINGUAL_TABLET | SUBLINGUAL | Status: DC | PRN
  Administered 2012-07-03: 0.4 mg via SUBLINGUAL
  Filled 2012-07-03: qty 25

## 2012-07-03 MED ORDER — FUROSEMIDE 10 MG/ML IJ SOLN
80.0000 mg | Freq: Once | INTRAMUSCULAR | Status: AC
  Administered 2012-07-03: 80 mg via INTRAVENOUS
  Filled 2012-07-03: qty 8

## 2012-07-03 MED ORDER — OXYCODONE-ACETAMINOPHEN 5-325 MG PO TABS: 2.0000 | ORAL_TABLET | ORAL | Status: AC | PRN

## 2012-07-03 MED ORDER — LORAZEPAM 2 MG/ML IJ SOLN
1.0000 mg | Freq: Once | INTRAMUSCULAR | Status: DC
  Filled 2012-07-03: qty 1

## 2012-07-03 MED ORDER — NITROGLYCERIN 2 % TD OINT
1.0000 [in_us] | TOPICAL_OINTMENT | Freq: Once | TRANSDERMAL | Status: AC
  Administered 2012-07-03: 1 [in_us] via TOPICAL
  Filled 2012-07-03: qty 30

## 2012-07-03 NOTE — ED Provider Notes (Signed)
History     CSN: [id number]  Arrival date & time 07/03/12  0112   First MD Initiated Contact with Patient 07/03/12 0127      Chief Complaint  Patient presents with  . Shortness of Breath    (Consider location/radiation/quality/duration/timing/severity/associated sxs/prior treatment) HPI This 37 year old female has a history of hypertension, diabetes, morbid obesity, heart failure, states she had an unremarkable cardiac catheterization within the last year at [facility], takes 40 mg of Lasix twice daily at baseline, presents now with 3 days of gradually worsening edema to her legs, gradually worsening shortness of breath with exertion, at baseline she can walk 20-30 feet without shortness of breath, over the last few days she is now able to walk less intensity without shortness of breath, this evening over the last few hours she feels short of breath even at rest in within the last hour to 2 hours she has sharp stabbing left anterior nonradiating chest pain worse with position changes palpation and movement of her chest wall and taking a deep breath and coughing, she is no fever, she is no abdominal pain, she is no vomiting diarrhea or bloody stools, there is no treatment prior to arrival, or shortness of breath is moderately severe, she is tolerating BiPAP before and would like to try that now to see if it helps her breathing, she is on home oxygen already at baseline. Past Medical History  Diagnosis Date  . Diabetes mellitus   . Asthma   . CHF (congestive heart failure)   . Chronic back pain   . Migraine headache   . Hypertension   . Morbid obesity   . Polysubstance abuse   . Sleep apnea   . Drug-seeking behavior   . Urinary tract infection   . Arthritis     Past Surgical History  Procedure Laterality Date  . Cholecystectomy    . Cardiac catheterization      two, no blockage    Family History  Problem Relation Age of Onset  . Other Neg Hx     History  Substance Use Topics   . Smoking status: Current Every Day Smoker -- 0.50 packs/day for 16 years    Types: Cigarettes  . Smokeless tobacco: Never Used  . Alcohol Use: No     Comment: quit 5 yrs ago    OB History   Grav Para Term Preterm Abortions TAB SAB Ect Mult Living   1    1  1    0      Review of Systems 10 Systems reviewed and are negative for acute change except as noted in the HPI. Allergies  Review of patient's allergies indicates no known allergies.  Home Medications   Current Outpatient Rx  Name  Route  Sig  Dispense  Refill  . acetaminophen (TYLENOL) 325 MG tablet   Oral   Take 650 mg by mouth every 6 (six) hours as needed. Pain/fever.         Marland Kitchen albuterol (PROVENTIL HFA;VENTOLIN HFA) 108 (90 BASE) MCG/ACT inhaler   Inhalation   Inhale 2 puffs into the lungs every 6 (six) hours as needed. For asthma attacks.         Marland Kitchen aspirin 81 MG chewable tablet   Oral   Chew 81 mg by mouth daily.         . cloNIDine (CATAPRES) 0.1 MG tablet   Oral   Take 0.1 mg by mouth 2 (two) times daily.         Marland Kitchen  diazepam (VALIUM) 5 MG tablet   Oral   Take 1 tablet (5 mg total) by mouth every 8 (eight) hours as needed (muscle spasm).   15 tablet   0   . diltiazem (CARDIZEM CD) 120 MG 24 hr capsule   Oral   Take 120 mg by mouth daily.         Marland Kitchen exenatide (BYETTA) 5 MCG/0.02ML SOLN   Subcutaneous   Inject 5 mcg into the skin every morning.         . furosemide (LASIX) 20 MG tablet   Oral   Take 2 tablets (40 mg total) by mouth 2 (two) times daily.   60 tablet   0   . gabapentin (NEURONTIN) 300 MG capsule   Oral   Take 1 capsule (300 mg total) by mouth 3 (three) times daily.   60 capsule   0   . hydrALAZINE (APRESOLINE) 25 MG tablet   Oral   Take 50 mg by mouth every 8 (eight) hours.         . insulin aspart (NOVOLOG) 100 UNIT/ML injection   Subcutaneous   Inject 0-20 Units into the skin 3 (three) times daily before meals. Sliding scale.         . insulin glargine  (LANTUS) 100 UNIT/ML injection   Subcutaneous   Inject 40 Units into the skin at bedtime.          . isosorbide mononitrate (IMDUR) 30 MG 24 hr tablet   Oral   Take 1 tablet (30 mg total) by mouth daily.   30 tablet   0   . LORazepam (ATIVAN) 1 MG tablet   Oral   Take 1 tablet (1 mg total) by mouth 3 (three) times daily as needed for anxiety.   9 tablet   0   . LORazepam (ATIVAN) 1 MG tablet   Oral   Take 1 tablet (1 mg total) by mouth 3 (three) times daily as needed for anxiety.   10 tablet   0   . LORazepam (ATIVAN) 2 MG tablet   Oral   Take 2 mg by mouth every 6 (six) hours as needed. For anxiety.         . metoCLOPramide (REGLAN) 10 MG tablet   Oral   Take 10 mg by mouth every 6 (six) hours as needed. For indigestion         . oxyCODONE-acetaminophen (PERCOCET) 5-325 MG per tablet   Oral   Take 2 tablets by mouth every 4 (four) hours as needed for pain.   20 tablet   0   . oxyCODONE-acetaminophen (PERCOCET/ROXICET) 5-325 MG per tablet   Oral   Take 1 tablet by mouth every 4 (four) hours as needed. pain         . potassium chloride SA (K-DUR,KLOR-CON) 20 MEQ tablet   Oral   Take 40 mEq by mouth daily.         . sertraline (ZOLOFT) 25 MG tablet   Oral   Take 1 tablet (25 mg total) by mouth daily.   30 tablet   0     BP 197/138  Pulse 103  Resp 25  SpO2 99%  Physical Exam  Nursing note and vitals reviewed. Constitutional:  Awake, alert, nontoxic appearance. Obese.  HENT:  Head: Atraumatic.  Eyes: Right eye exhibits no discharge. Left eye exhibits no discharge.  Neck: Neck supple.  Cardiovascular: Regular rhythm.   No murmur heard. Tachycardic  Pulmonary/Chest: She is  in respiratory distress. She has no wheezes. She has rales. She exhibits no tenderness.  Moderate respiratory distress, speaks short sentences with some distress, bibasilar crackles, decreased breath sounds throughout, no wheezes audible, no retractions or accessory muscle  usage noted  Abdominal: Soft. There is no tenderness. There is no rebound.  Musculoskeletal: She exhibits edema. She exhibits no tenderness.  Baseline ROM, no obvious new focal weakness. Mild edema to both lower legs without cellulitis noted.  Neurological:  Mental status and motor strength appears baseline for patient and situation.  Skin: No rash noted.  Psychiatric: She has a normal mood and affect.   pulse oximetry 100% on nasal cannula oxygen which is baseline for patient.  ED Course  Procedures (including critical care time) ECG: Sinus tachycardia, ventricular rate is 16, normal axis, artifact present, nonspecific T wave abnormality, multiple premature ventricular complex, no comparison ECG immediately available.   Patient feels much better ambulating in the ED after treatment. Labs Reviewed  GLUCOSE, CAPILLARY - Abnormal; Notable for the following:    Glucose-Capillary 283 (*)    All other components within normal limits  PRO B NATRIURETIC PEPTIDE - Abnormal; Notable for the following:    Pro B Natriuretic peptide (BNP) 3533.0 (*)    All other components within normal limits  CBC WITH DIFFERENTIAL - Abnormal; Notable for the following:    Hemoglobin 11.4 (*)    MCH 25.9 (*)    RDW 16.5 (*)    All other components within normal limits  COMPREHENSIVE METABOLIC PANEL - Abnormal; Notable for the following:    Sodium 132 (*)    Chloride 93 (*)    Glucose, Bld 304 (*)    Creatinine, Ser 1.38 (*)    Total Protein 8.8 (*)    Albumin 3.3 (*)    Total Bilirubin 1.5 (*)    GFR calc non Af Amer 48 (*)    GFR calc Af Amer 56 (*)    All other components within normal limits  POCT I-STAT TROPONIN I  POCT PREGNANCY, URINE   No results found.   1. Heart failure       MDM  Pt stable in ED with no significant deterioration in condition.  Patient / Family / Caregiver informed of clinical course, understand medical decision-making process, and agree with plan.             Hurman Horn, MD 07/08/12 2010

## 2012-07-03 NOTE — ED Notes (Signed)
Pt c/o shortness of breath and chest tightness for 3 days; worse tonight; presents clenching chest

## 2012-07-03 NOTE — ED Notes (Signed)
Patient is alert and oriented x3.  She was given DC instructions and follow up visit instructions.  Patient gave verbal understanding. She was DC ambulatory under her own power to home.  V/S stable.  He was not showing any signs of distress on DC 

## 2013-06-11 IMAGING — CR DG CHEST 2V
2 series · 2 of 2 positions shown · non-contrast
Comparison: 11/08/2010

CLINICAL DATA: Chest pain.

CHEST - 2 VIEW

[w chest lat *]
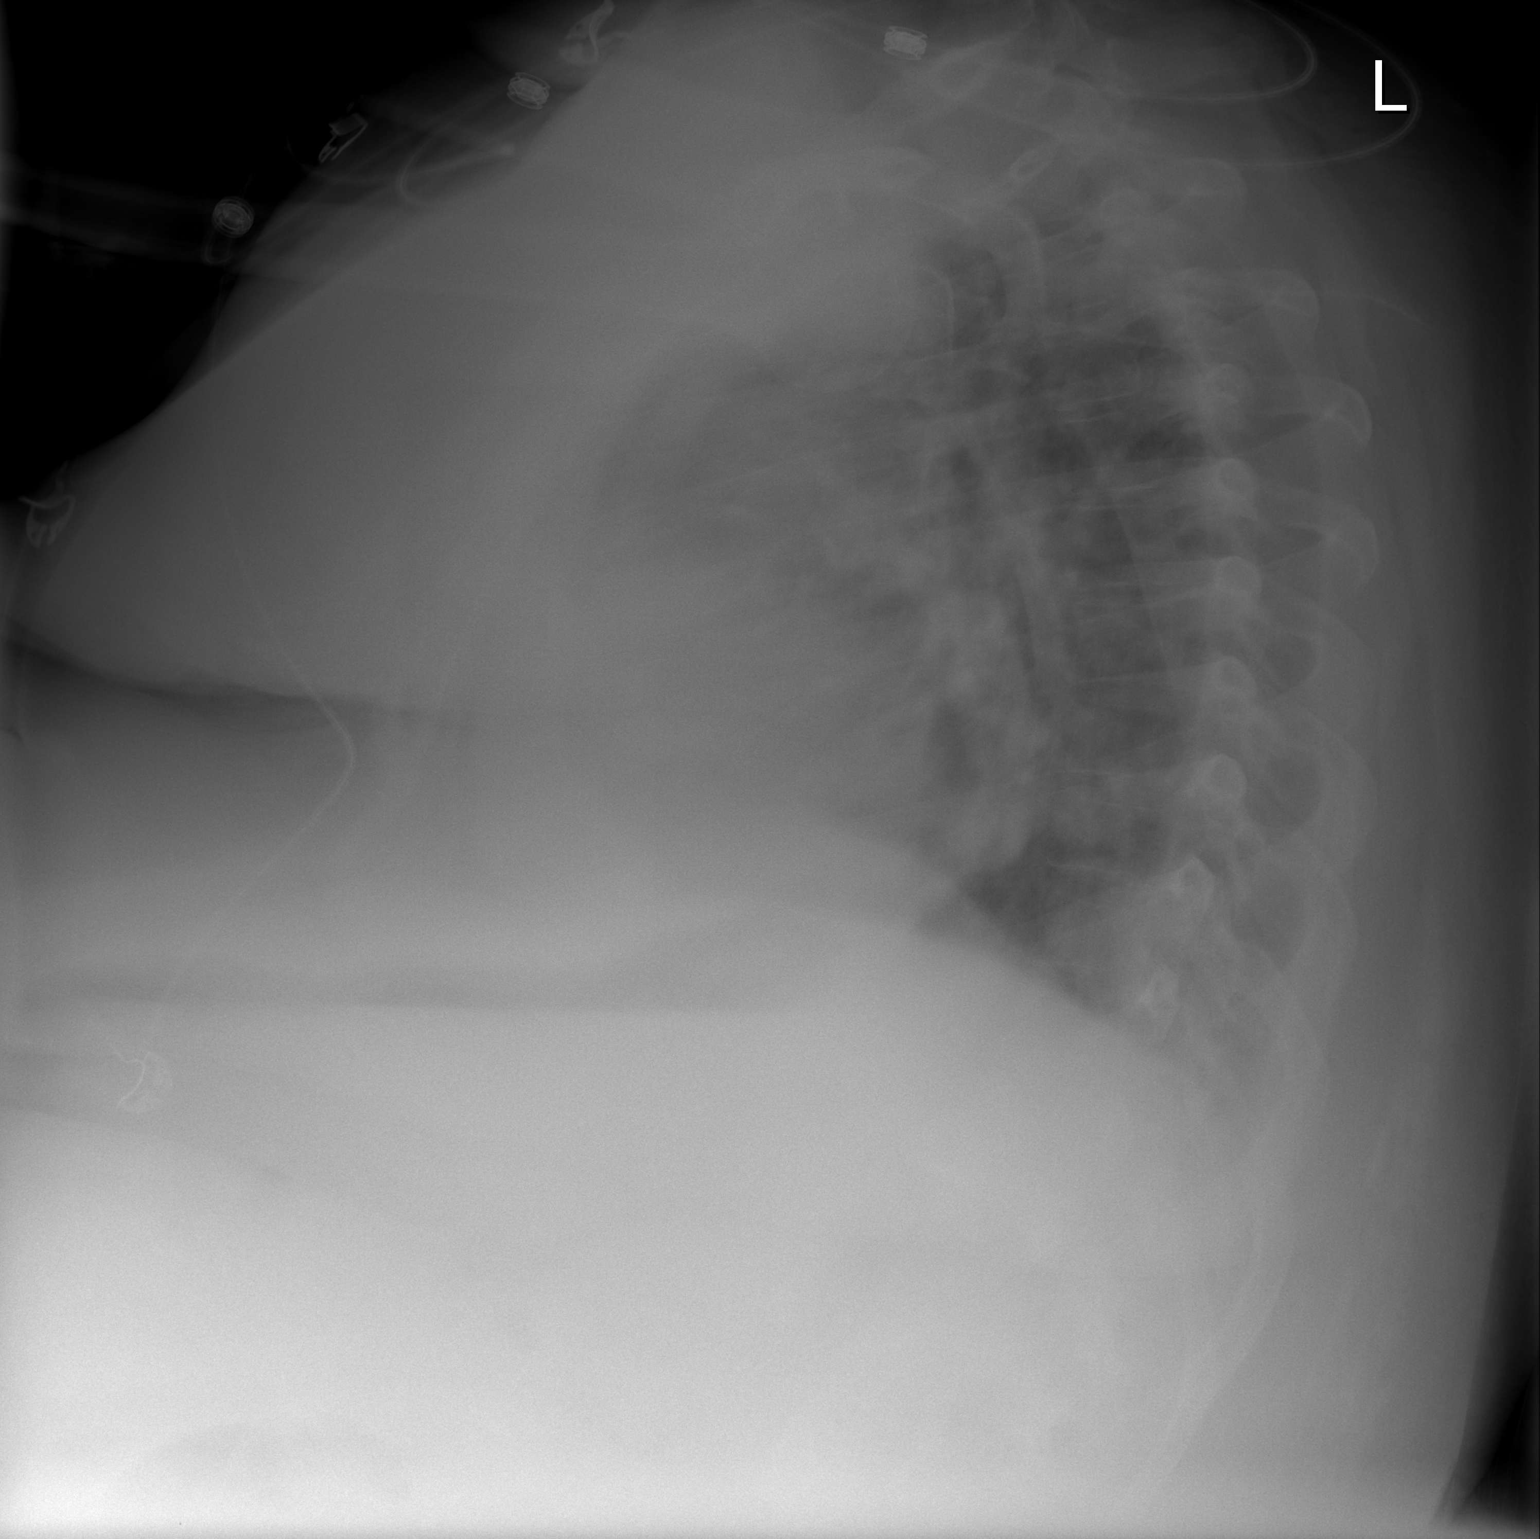

[view not recorded]
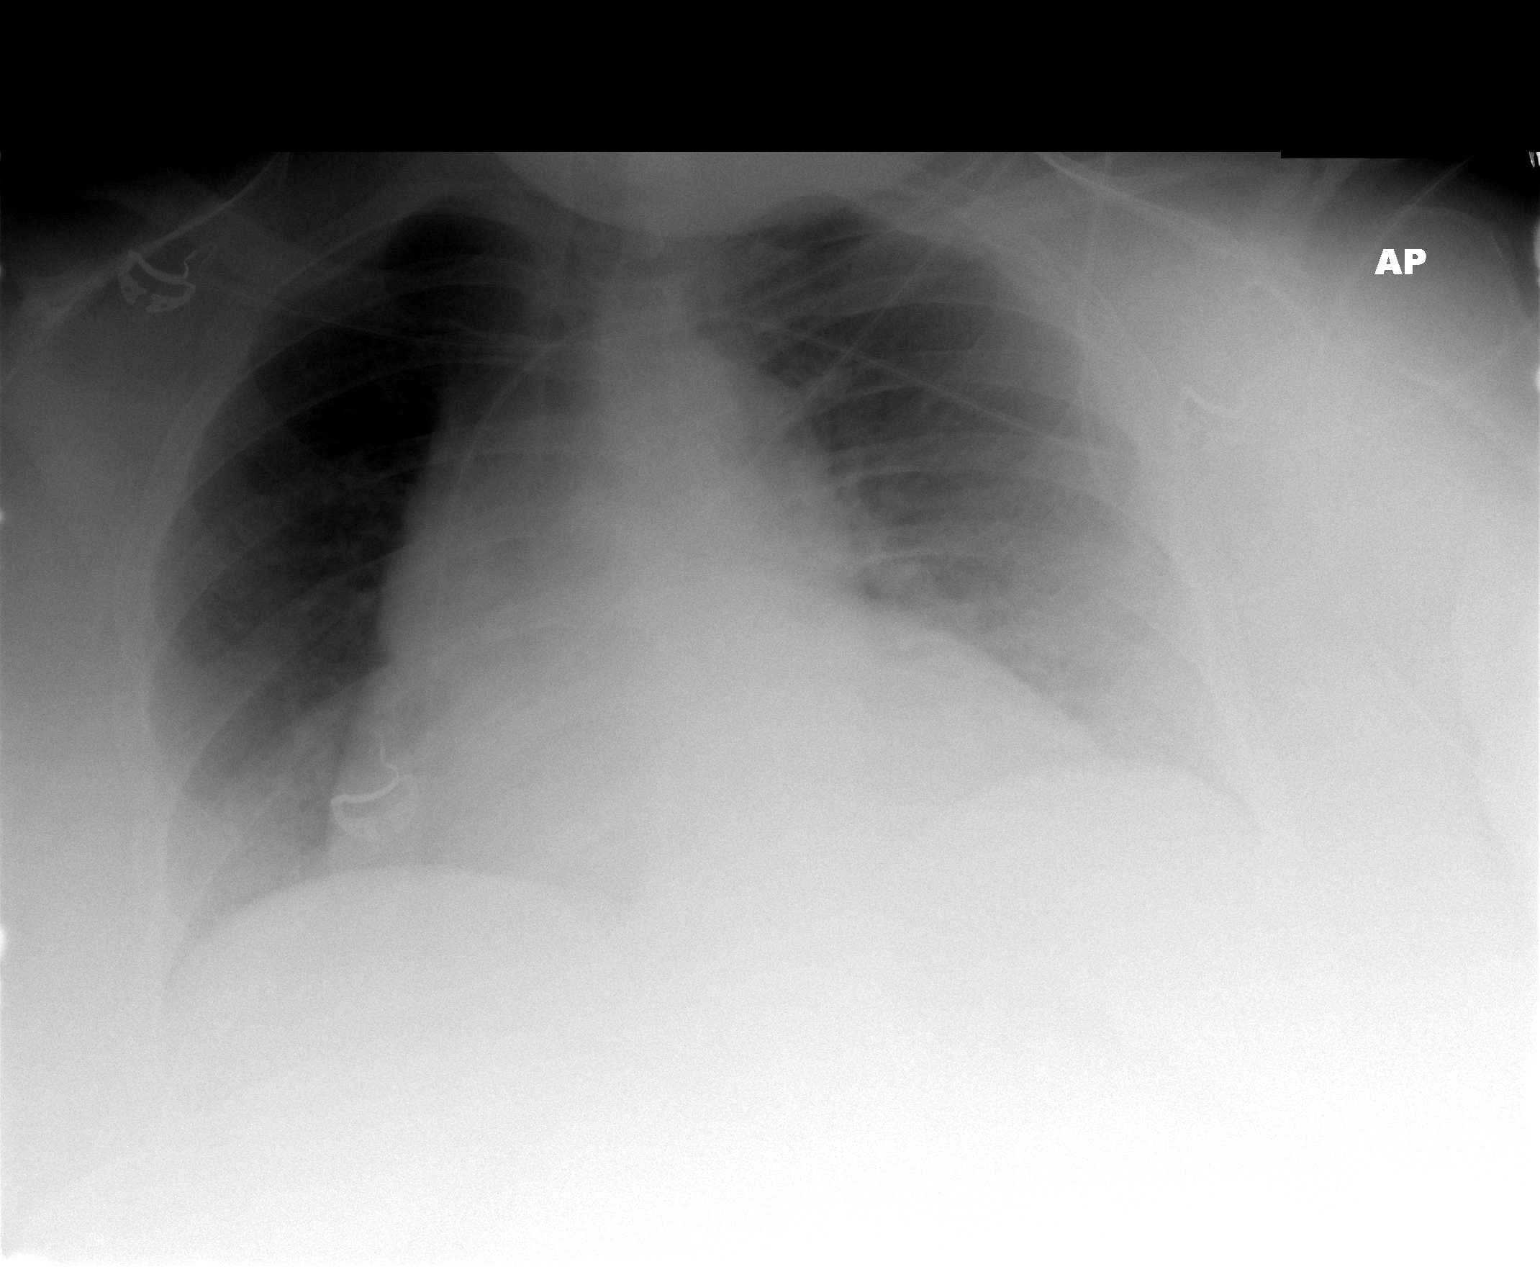

[2 of 2 positions shown; findings below may reference images not displayed]

FINDINGS: Cardiomegaly.  Prominent mediastinal contours may be
exaggerated by technique.  Central vascular congestion.
Hypoaerated lungs without appreciable focal consolidation, pleural
effusion, or pneumothorax.
IMPRESSION: Cardiomegaly with central vascular congestion.  Poor inspiratory
effort results in hypoaerated lungs and mild interstitial crowding.
No overt edema or focal consolidation.

## 2013-07-10 IMAGING — CR DG CHEST 2V
2 series · 2 of 2 positions shown · non-contrast
Comparison: 12/03/2010

CLINICAL DATA: Chest pain, history CHF, diabetes, asthma,
hypertension

CHEST - 2 VIEW

[w chest pa *]
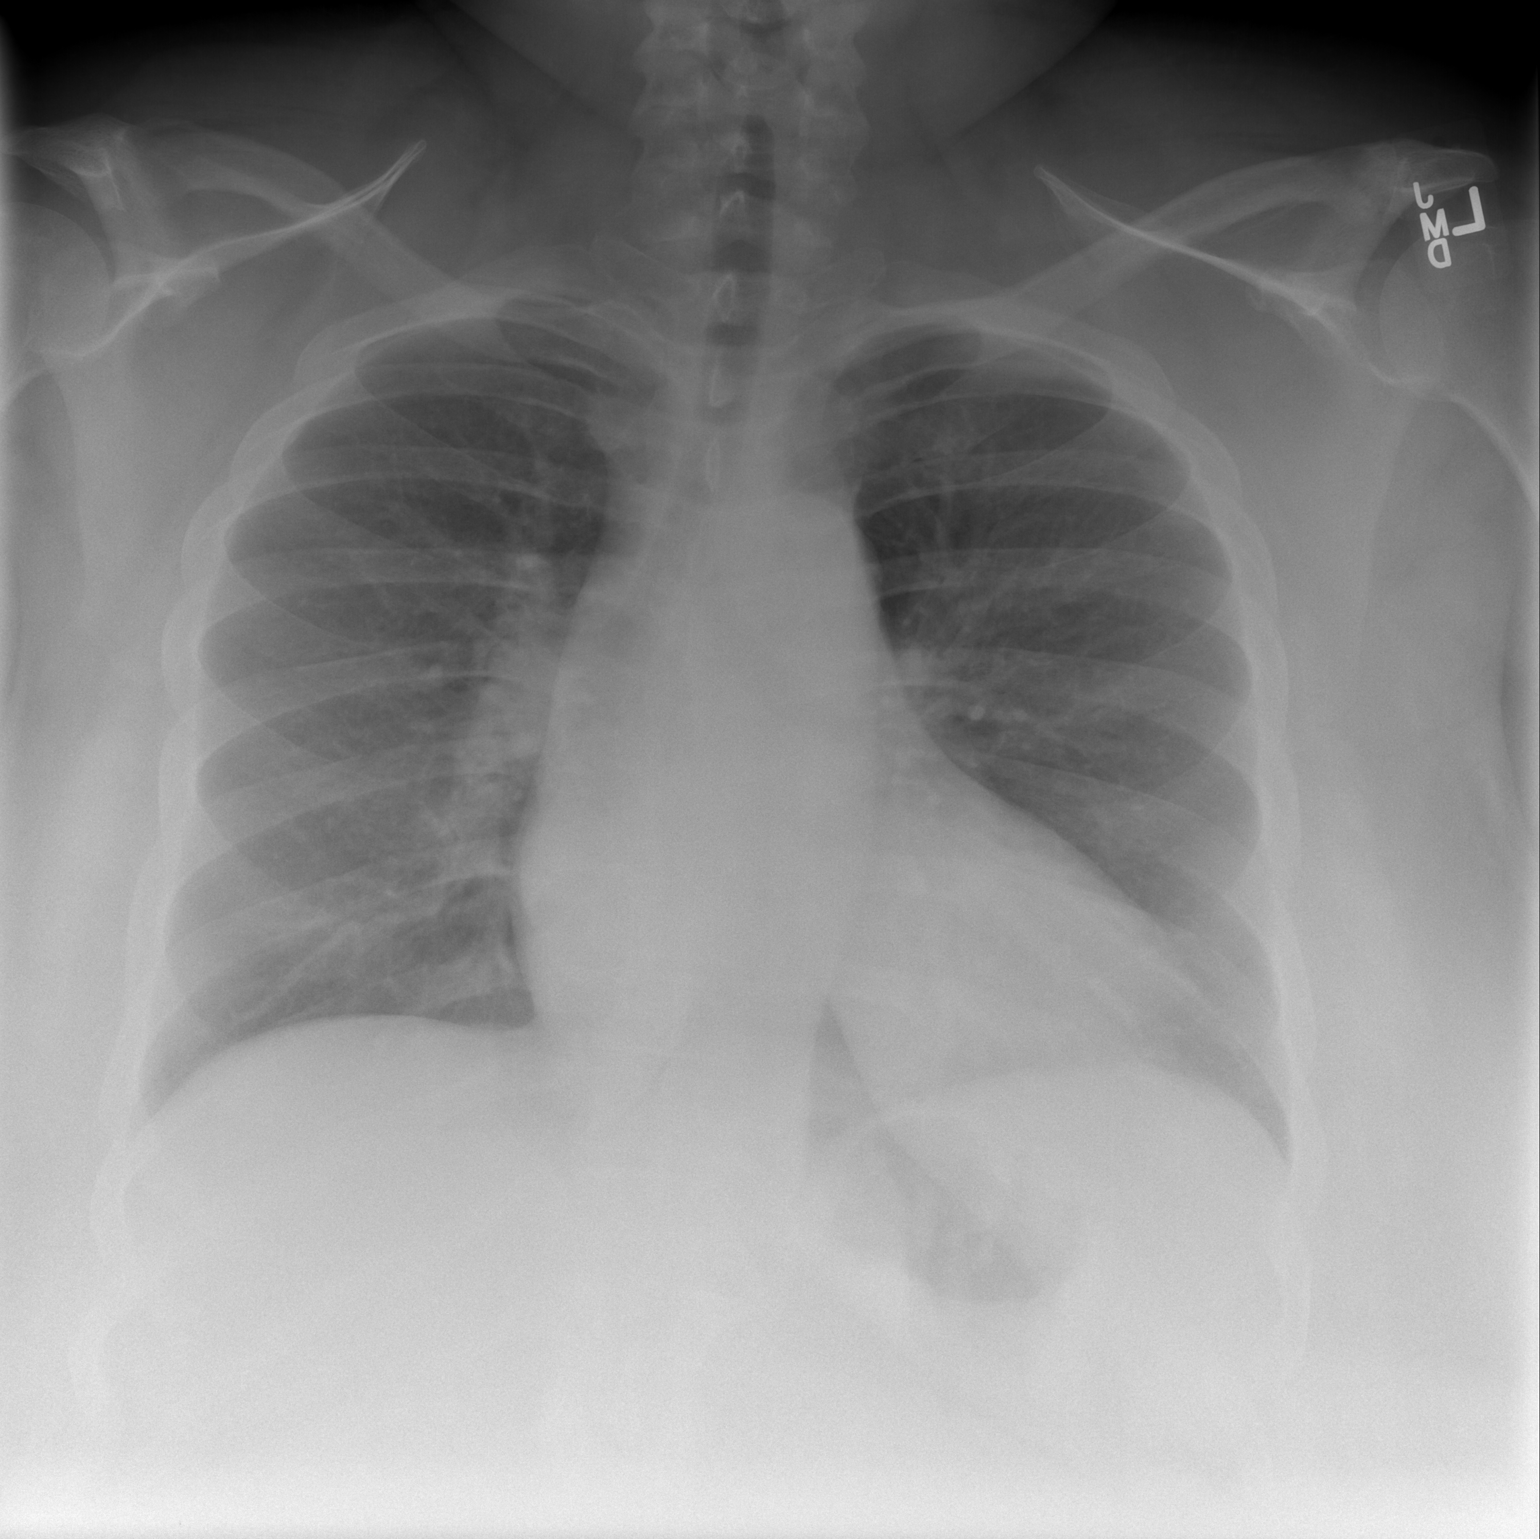

[w chest lat *]
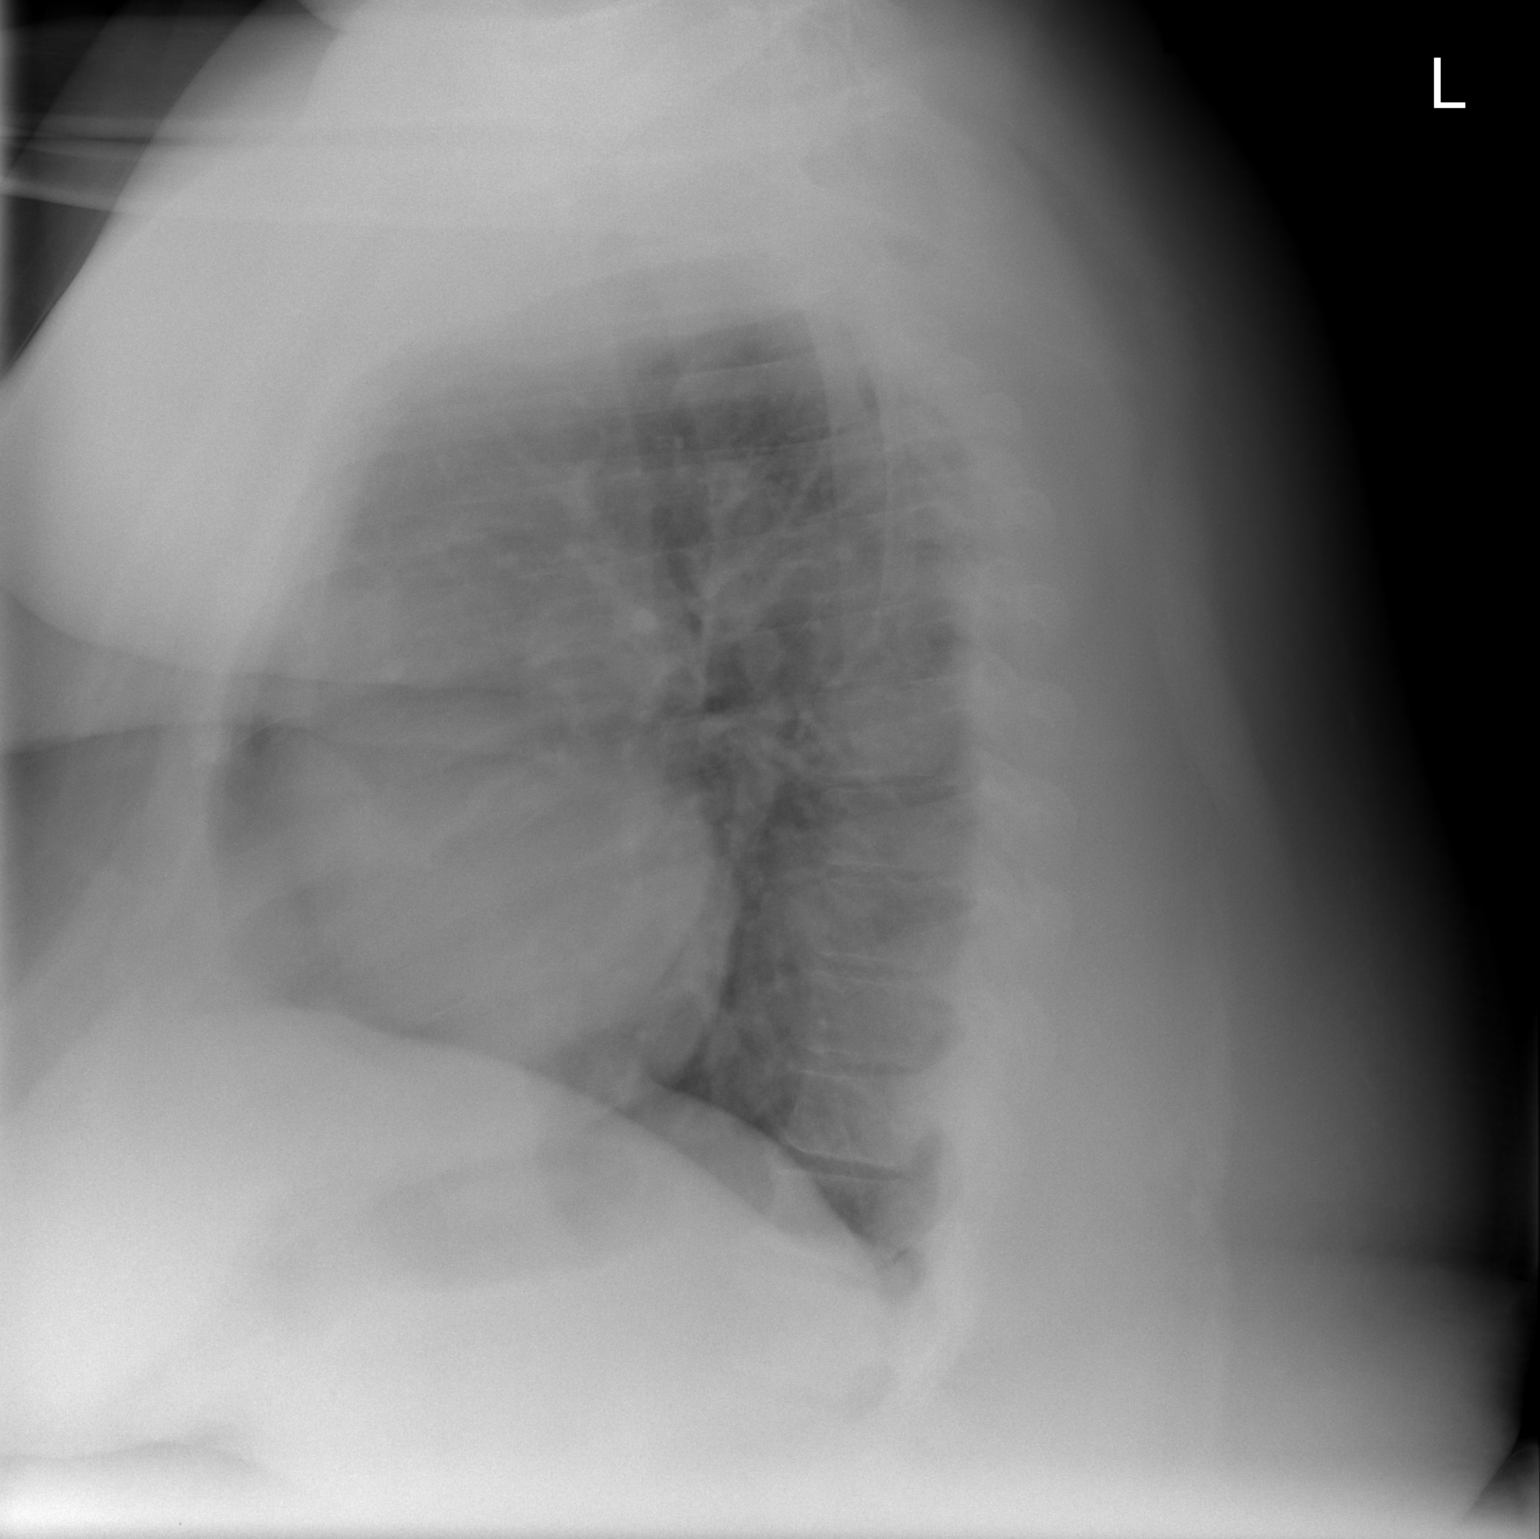

[2 of 2 positions shown; findings below may reference images not displayed]

FINDINGS: Enlargement of cardiac silhouette.
Mediastinal contours and pulmonary vascularity normal.
Lungs clear.
No pleural effusion or pneumothorax.
Bones unremarkable.
IMPRESSION: Enlargement of cardiac silhouette.
No acute abnormalities.

## 2014-03-16 ENCOUNTER — Encounter (HOSPITAL_COMMUNITY): Payer: Self-pay | Admitting: *Deleted

## 2014-06-27 IMAGING — CR DG CHEST 2V
2 series · 2 of 2 positions shown · non-contrast
Comparison: Chest x-ray of 11/25/2011

CLINICAL DATA: Chest pain, shortness of breath, smoking history

CHEST - 2 VIEW

[w chest lat]
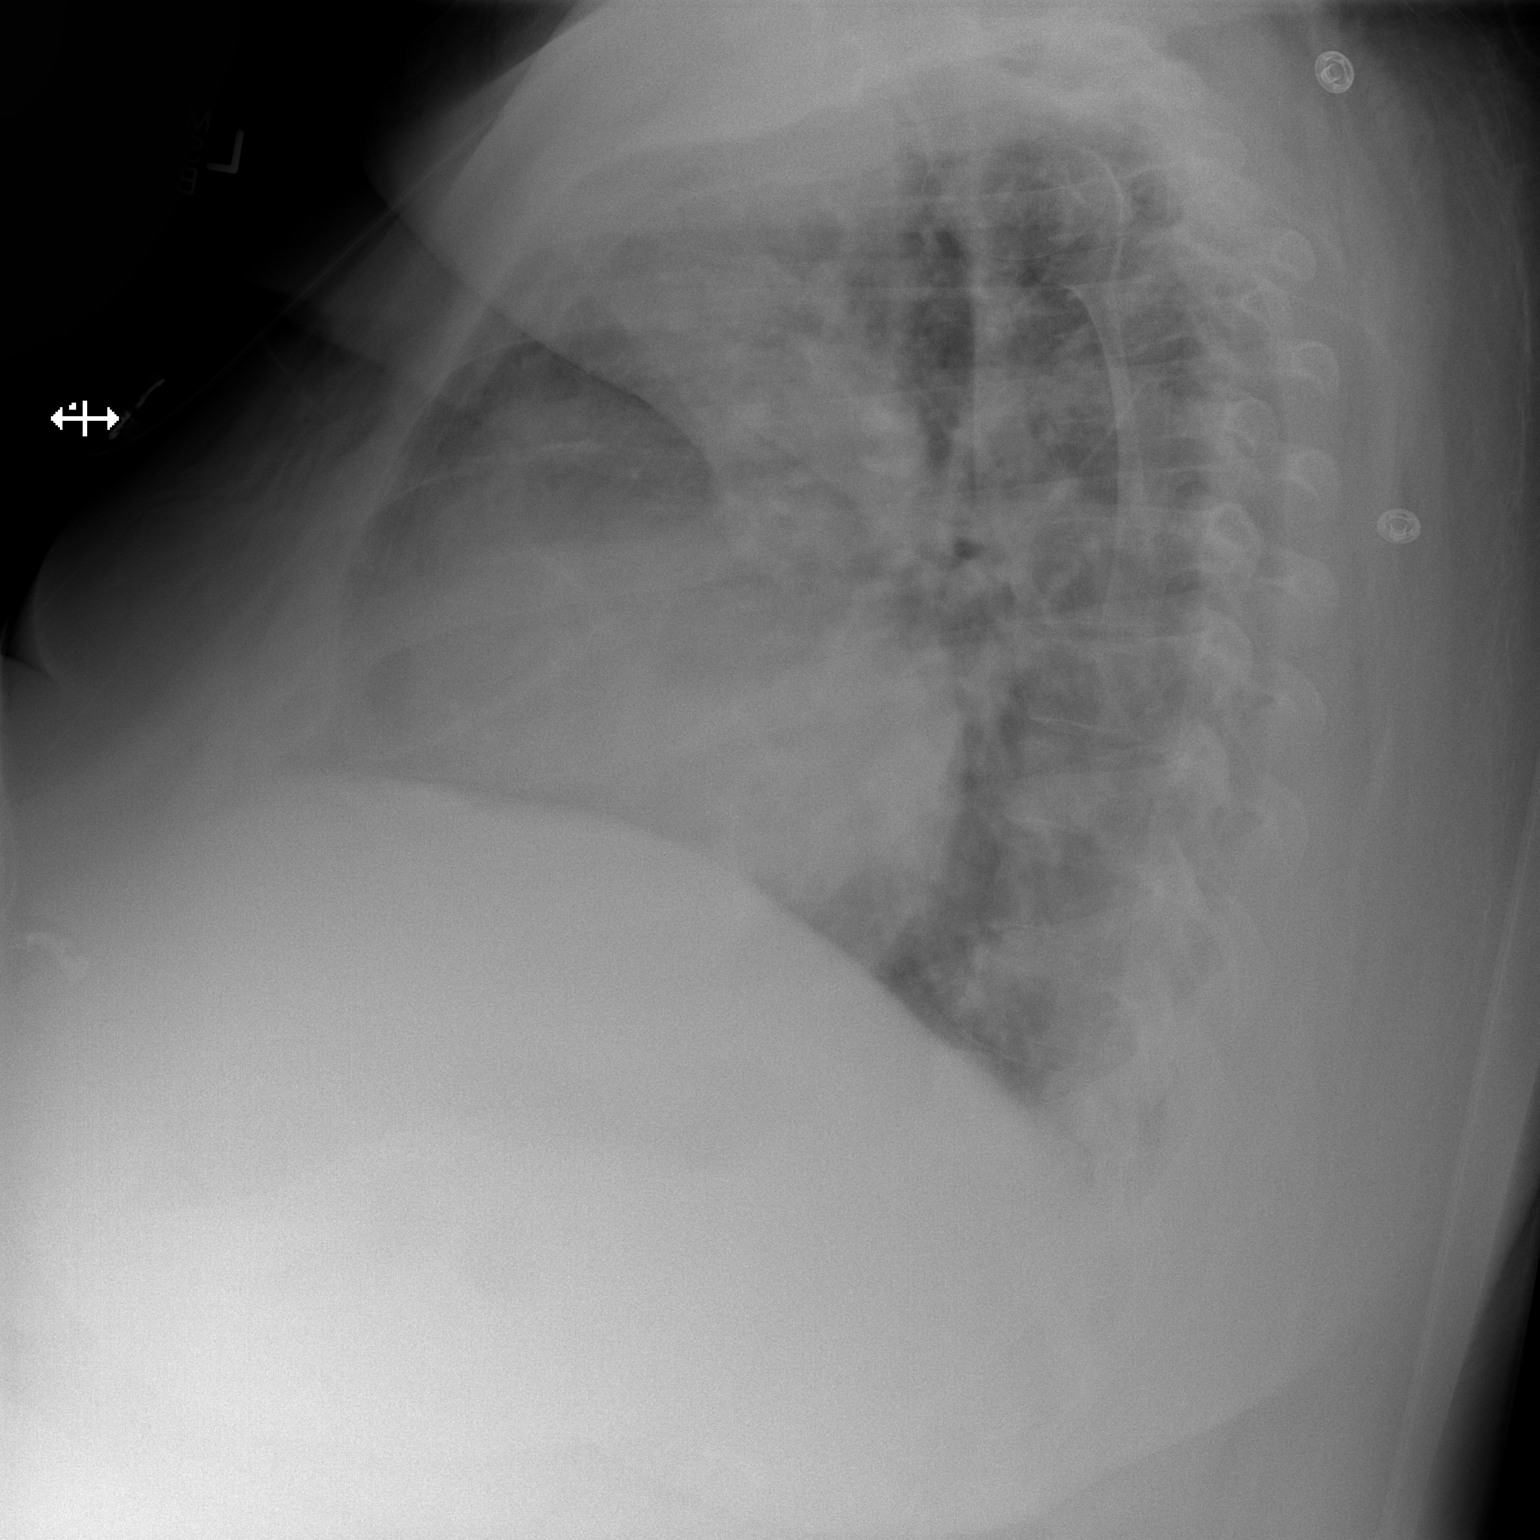

[x chest ap]
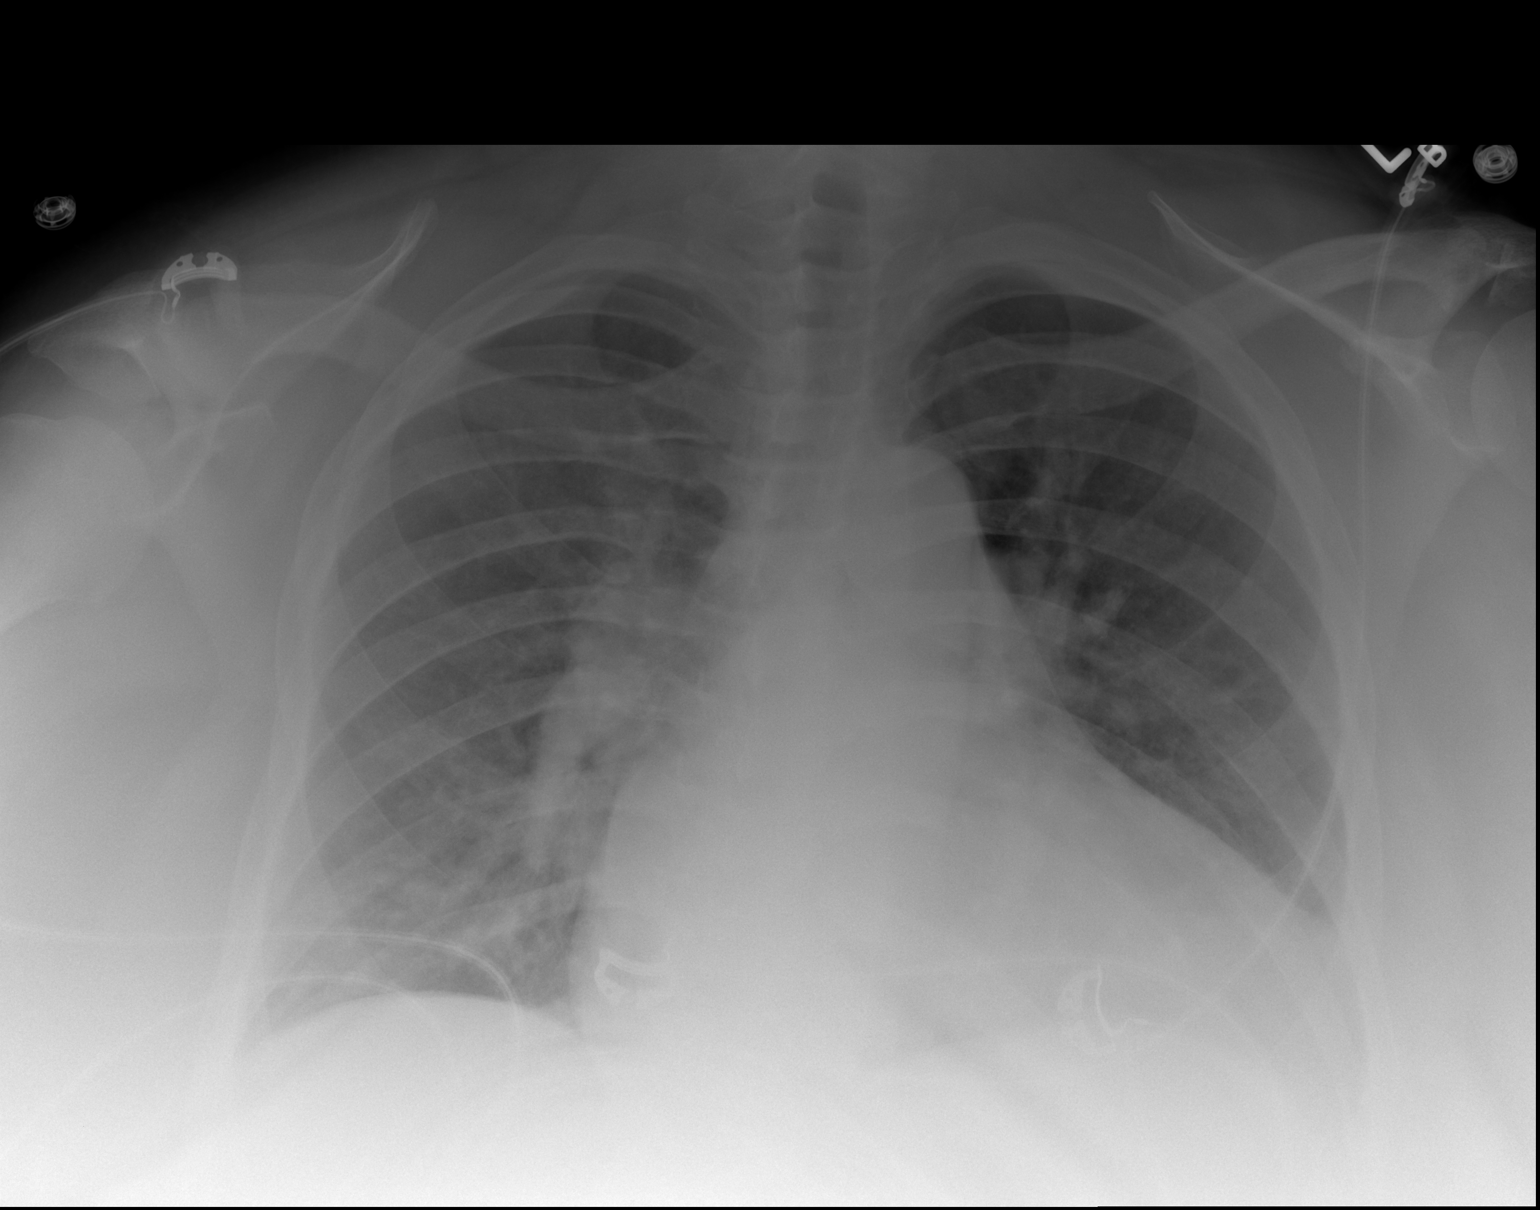

[2 of 2 positions shown; findings below may reference images not displayed]

FINDINGS: Cardiomegaly is stable.  No focal infiltrate or effusion
is seen.  There may be mild pulmonary vascular congestion present,
but no effusion is seen.  No acute bony abnormality is noted.
IMPRESSION: Stable moderate cardiomegaly.  Question mild pulmonary vascular
congestion.

## 2016-09-30 ENCOUNTER — Emergency Department (HOSPITAL_COMMUNITY)
Admission: EM | Admit: 2016-09-30 | Discharge: 2016-09-30 | Disposition: A | Discharge status: Home / Self Care | Payer: Medicare Other | Attending: Emergency Medicine | Admitting: Emergency Medicine

## 2016-09-30 ENCOUNTER — Encounter (HOSPITAL_COMMUNITY): Payer: Self-pay | Admitting: Oncology

## 2016-09-30 DIAGNOSIS — J449 Chronic obstructive pulmonary disease, unspecified: Secondary | ICD-10-CM | POA: Diagnosis not present

## 2016-09-30 DIAGNOSIS — N183 Chronic kidney disease, stage 3 (moderate): Secondary | ICD-10-CM | POA: Insufficient documentation

## 2016-09-30 DIAGNOSIS — L02219 Cutaneous abscess of trunk, unspecified: Secondary | ICD-10-CM | POA: Diagnosis not present

## 2016-09-30 DIAGNOSIS — Z794 Long term (current) use of insulin: Secondary | ICD-10-CM | POA: Diagnosis not present

## 2016-09-30 DIAGNOSIS — Z79899 Other long term (current) drug therapy: Secondary | ICD-10-CM | POA: Diagnosis not present

## 2016-09-30 DIAGNOSIS — I130 Hypertensive heart and chronic kidney disease with heart failure and stage 1 through stage 4 chronic kidney disease, or unspecified chronic kidney disease: Secondary | ICD-10-CM | POA: Diagnosis not present

## 2016-09-30 DIAGNOSIS — E119 Type 2 diabetes mellitus without complications: Secondary | ICD-10-CM | POA: Diagnosis not present

## 2016-09-30 DIAGNOSIS — F17210 Nicotine dependence, cigarettes, uncomplicated: Secondary | ICD-10-CM | POA: Diagnosis not present

## 2016-09-30 DIAGNOSIS — Z7982 Long term (current) use of aspirin: Secondary | ICD-10-CM | POA: Diagnosis not present

## 2016-09-30 DIAGNOSIS — I5032 Chronic diastolic (congestive) heart failure: Secondary | ICD-10-CM | POA: Insufficient documentation

## 2016-09-30 DIAGNOSIS — L0291 Cutaneous abscess, unspecified: Secondary | ICD-10-CM

## 2016-09-30 MED ORDER — SULFAMETHOXAZOLE-TRIMETHOPRIM 800-160 MG PO TABS
1.0000 | ORAL_TABLET | Freq: Once | ORAL | Status: AC
  Administered 2016-09-30: 1 via ORAL
  Filled 2016-09-30: qty 1

## 2016-09-30 MED ORDER — BACITRACIN ZINC 500 UNIT/GM EX OINT
TOPICAL_OINTMENT | CUTANEOUS | Status: AC
  Administered 2016-09-30
  Filled 2016-09-30: qty 2.7

## 2016-09-30 MED ORDER — HYDROCODONE-ACETAMINOPHEN 5-325 MG PO TABS: 1.0000 | ORAL_TABLET | ORAL | 0 refills | Status: AC | PRN

## 2016-09-30 MED ORDER — SULFAMETHOXAZOLE-TRIMETHOPRIM 800-160 MG PO TABS: 1.0000 | ORAL_TABLET | Freq: Two times a day (BID) | ORAL | 0 refills | Status: AC

## 2016-09-30 MED ORDER — LIDOCAINE HCL (PF) 1 % IJ SOLN
10.0000 mL | Freq: Once | INTRAMUSCULAR | Status: AC
  Administered 2016-09-30: 10 mL

## 2016-09-30 MED ORDER — LIDOCAINE HCL 1 % IJ SOLN
INTRAMUSCULAR | Status: AC
  Filled 2016-09-30: qty 20

## 2016-09-30 MED ORDER — HYDROMORPHONE HCL 1 MG/ML IJ SOLN
1.0000 mg | Freq: Once | INTRAMUSCULAR | Status: AC
  Administered 2016-09-30: 1 mg via INTRAMUSCULAR
  Filled 2016-09-30: qty 1

## 2016-09-30 NOTE — ED Triage Notes (Signed)
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  Pt has a softball sized abscess to her right mid back.  Area is hard, warm and painful.  Pt rates pain 10/10.

## 2016-09-30 NOTE — ED Provider Notes (Signed)
[facility] DEPT Provider Note   CSN: [id number] Arrival date & time: 09/30/16  2211     History   Chief Complaint Chief Complaint  Patient presents with  . Abscess    HPI Debra Stokes is a 41 y.o. female.  Pt presents to the ED today with an abscess to his back.  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  She noticed an abscess about 1 week ago.  She said she could not sleep, so she came in today.      Past Medical History:  Diagnosis Date  . Arthritis   . Asthma   . CHF (congestive heart failure) ([place])   . Chronic back pain   . Diabetes mellitus   . Drug-seeking behavior   . Hypertension   . Migraine headache   . Morbid obesity ([place])   . Polysubstance abuse   . Sleep apnea   . Urinary tract infection     Patient Active Problem List   Diagnosis Date Noted  . SIRS (systemic inflammatory response syndrome) ([place]) 02/02/2012  . UTI (lower urinary tract infection) 02/02/2012  . Acute on chronic renal failure ([place]) 02/02/2012  . Depression 01/24/2012  . Chronic diastolic heart failure, NYHA class 2 ([place]) 01/22/2012  . Acute respiratory failure with hypoxia ([place]) 01/22/2012  . CKD (chronic kidney disease) stage 3, GFR 30-59 ml/min 01/22/2012  . SOB (shortness of breath) 01/21/2012  . Diastolic CHF, acute on chronic (HCC) 12/19/2011  . Neuropathy of both feet 12/19/2011  . Hypertensive emergency 12/19/2011  . Chest pain 11/26/2011  . Hypokalemia 11/26/2011  . Diabetes mellitus type 2 with complications, uncontrolled ([place]) 10/31/2011  . Hypertension, uncontrolled 10/31/2011  . Morbid obesity with BMI of 50.0-59.9, adult ([place]) 10/31/2011  . Polysubstance abuse 10/31/2011  . COPD (chronic obstructive pulmonary disease) ([place]) 10/31/2011  . Tobacco use 10/31/2011  . Obstructive sleep apnea 10/31/2011  . Neck mass 10/31/2011  . Adenopathy, hilar 10/31/2011  . Vulvar abscess 10/29/2011    Past Surgical History:  Procedure Laterality Date  .  CARDIAC CATHETERIZATION     two, no blockage  . CHOLECYSTECTOMY      OB History    Gravida Para Term Preterm AB Living   1       1 0   SAB TAB Ectopic Multiple Live Births   1               Home Medications    Prior to Admission medications   Medication Sig Start Date End Date Taking? Authorizing Provider  acetaminophen (TYLENOL) 325 MG tablet Take 650 mg by mouth every 6 (six) hours as needed. Pain/fever.    [provider]  albuterol (PROVENTIL HFA;VENTOLIN HFA) 108 (90 BASE) MCG/ACT inhaler Inhale 2 puffs into the lungs every 6 (six) hours as needed. For asthma attacks.    [provider]  aspirin 81 MG chewable tablet Chew 81 mg by mouth daily.    [provider]  cloNIDine (CATAPRES) 0.1 MG tablet Take 0.1 mg by mouth 2 (two) times daily.    [provider]  diazepam (VALIUM) 5 MG tablet Take 1 tablet (5 mg total) by mouth every 8 (eight) hours as needed (muscle spasm). 02/19/12   Linton Flemings, MD  diltiazem (CARDIZEM CD) 120 MG 24 hr capsule Take 120 mg by mouth daily.    [provider]  exenatide (BYETTA) 5 MCG/0.02ML SOLN Inject 5 mcg into the skin every morning. 11/04/11   Constant, Peggy,  MD  furosemide (LASIX) 20 MG tablet Take 2 tablets (40 mg total) by mouth 2 (two) times daily. 01/24/12   Elvera Lennox, MD  gabapentin (NEURONTIN) 300 MG capsule Take 1 capsule (300 mg total) by mouth 3 (three) times daily. 02/19/12   Linton Flemings, MD  hydrALAZINE (APRESOLINE) 25 MG tablet Take 50 mg by mouth every 8 (eight) hours.    [provider]  HYDROcodone-acetaminophen (NORCO/VICODIN) 5-325 MG tablet Take 1 tablet by mouth every 4 (four) hours as needed. 09/30/16   Isla Pence, MD  insulin aspart (NOVOLOG) 100 UNIT/ML injection Inject 0-20 Units into the skin 3 (three) times daily before meals. Sliding scale. 11/04/11 11/03/12  Constant, Peggy, MD  insulin glargine (LANTUS) 100 UNIT/ML injection Inject 40 Units into the skin at  bedtime.     [provider]  isosorbide mononitrate (IMDUR) 30 MG 24 hr tablet Take 1 tablet (30 mg total) by mouth daily. 01/24/12 01/23/13  Elvera Lennox, MD  LORazepam (ATIVAN) 1 MG tablet Take 1 tablet (1 mg total) by mouth 3 (three) times daily as needed for anxiety. 02/21/12   Carlota Raspberry, Tiffany, PA-C  LORazepam (ATIVAN) 1 MG tablet Take 1 tablet (1 mg total) by mouth 3 (three) times daily as needed for anxiety. 02/21/12   Lily Kocher, PA-C  LORazepam (ATIVAN) 2 MG tablet Take 2 mg by mouth every 6 (six) hours as needed. For anxiety.    [provider]  metoCLOPramide (REGLAN) 10 MG tablet Take 10 mg by mouth every 6 (six) hours as needed. For indigestion 0/16/01   Delora Fuel, MD  oxyCODONE-acetaminophen (PERCOCET) 5-325 MG per tablet Take 2 tablets by mouth every 4 (four) hours as needed for pain. 07/03/12   Riki Altes, MD  oxyCODONE-acetaminophen (PERCOCET/ROXICET) 5-325 MG per tablet Take 1 tablet by mouth every 4 (four) hours as needed. pain 0/93/23   Delora Fuel, MD  potassium chloride SA (K-DUR,KLOR-CON) 20 MEQ tablet Take 40 mEq by mouth daily.    [provider]  sertraline (ZOLOFT) 25 MG tablet Take 1 tablet (25 mg total) by mouth daily. 01/24/12 01/23/13  Elvera Lennox, MD  sulfamethoxazole-trimethoprim (BACTRIM DS,SEPTRA DS) 800-160 MG tablet Take 1 tablet by mouth 2 (two) times daily. 09/30/16 10/07/16  Isla Pence, MD    Family History Family History  Problem Relation Age of Onset  . Other Neg Hx     Social History Social History  Substance Use Topics  . Smoking status: Current Every Day Smoker    Packs/day: 0.50    Years: 16.00    Types: Cigarettes  . Smokeless tobacco: Never Used  . Alcohol use No     Comment: quit 5 yrs ago     Allergies   Patient has no known allergies.   Review of Systems Review of Systems  Skin: Positive for wound.  All other systems reviewed and are negative.    Physical Exam Updated Vital Signs BP (!)  206/134 (BP Location: Right Wrist)   Pulse (!) 110   Temp 98 F (36.7 C) (Oral)   Resp 20   Ht 5' 5"  (1.651 m)   Wt (!) 302 lb (137 kg)   LMP 09/02/2016 (Approximate)   SpO2 100%   BMI 50.26 kg/m   Physical Exam  Constitutional: She is oriented to person, place, and time. She appears well-developed and well-nourished.  HENT:  Head: Normocephalic and atraumatic.  Right Ear: External ear normal.  Left Ear: External ear normal.  Nose: Nose  normal.  Mouth/Throat: Oropharynx is clear and moist.  Eyes: Conjunctivae and EOM are normal. Pupils are equal, round, and reactive to light.  Neck: Normal range of motion. Neck supple.  Cardiovascular: Normal rate, regular rhythm, normal heart sounds and intact distal pulses.   Pulmonary/Chest: Effort normal and breath sounds normal.  Abdominal: Soft. Bowel sounds are normal.  Musculoskeletal: Normal range of motion.  Neurological: She is alert and oriented to person, place, and time.  Skin:     Psychiatric: She has a normal mood and affect.  Nursing note and vitals reviewed.    ED Treatments / Results  Labs (all labs ordered are listed, but only abnormal results are displayed) Labs Reviewed - No data to display  EKG  EKG Interpretation None       Radiology No results found.  Procedures .Marland KitchenIncision and Drainage Date/Time: 09/30/2016 11:30 PM Performed by: Isla Pence Authorized by: Isla Pence   Consent:    Consent obtained:  Verbal   Consent given by:  Patient   Risks discussed:  Bleeding, incomplete drainage and pain   Alternatives discussed:  No treatment Location:    Type:  Abscess   Size:  10 cm by 10 cm   Location:  Trunk   Trunk location:  Back Pre-procedure details:    Skin preparation:  Betadine Anesthesia (see MAR for exact dosages):    Anesthesia method:  Local infiltration   Local anesthetic:  Lidocaine 2% WITH epi Procedure type:    Complexity:  Simple Procedure details:    Incision types:   Cruciate   Scalpel blade:  11   Wound management:  Probed and deloculated   Drainage:  Purulent   Drainage amount:  Copious   Wound treatment:  Wound left open   Packing materials:  None Post-procedure details:    Patient tolerance of procedure:  Tolerated well, no immediate complications   (including critical care time)  Medications Ordered in ED Medications  lidocaine (PF) (XYLOCAINE) 1 % injection 10 mL (not administered)  lidocaine (XYLOCAINE) 1 % (with pres) injection (not administered)  sulfamethoxazole-trimethoprim (BACTRIM DS,SEPTRA DS) 800-160 MG per tablet 1 tablet (not administered)  bacitracin 500 UNIT/GM ointment (not administered)  HYDROmorphone (DILAUDID) injection 1 mg (1 mg Intramuscular Given 09/30/16 2254)     Initial Impression / Assessment and Plan / ED Course  I have reviewed the triage vital signs and the nursing notes.  Pertinent labs & imaging results that were available during my care of the patient were reviewed by me and considered in my medical decision making (see chart for details).     Pt given first dose of bactrim here and d/c home with bactrim.  She knows to return if worse.  Final Clinical Impressions(s) / ED Diagnoses   Final diagnoses:  Abscess    New Prescriptions New Prescriptions   HYDROCODONE-ACETAMINOPHEN (NORCO/VICODIN) 5-325 MG TABLET    Take 1 tablet by mouth every 4 (four) hours as needed.   SULFAMETHOXAZOLE-TRIMETHOPRIM (BACTRIM DS,SEPTRA DS) 800-160 MG TABLET    Take 1 tablet by mouth 2 (two) times daily.     Isla Pence, MD 09/30/16 [phone]

## 2018-03-15 DEATH — deceased
